# Patient Record
Sex: Female | Born: 1965 | Race: White | Hispanic: No | Marital: Single | Smoking: Never smoker
Health system: Southern US, Community
[De-identification: ages and names within clinical notes are randomized; demographics above are authoritative.]

## PROBLEM LIST (undated history)

## (undated) DIAGNOSIS — M1990 Unspecified osteoarthritis, unspecified site: Secondary | ICD-10-CM

## (undated) DIAGNOSIS — R519 Headache, unspecified: Secondary | ICD-10-CM

## (undated) DIAGNOSIS — C801 Malignant (primary) neoplasm, unspecified: Secondary | ICD-10-CM

## (undated) HISTORY — PX: BREAST SURGERY: SHX581

## (undated) HISTORY — DX: Unspecified osteoarthritis, unspecified site: M19.90

---

## 2001-12-08 ENCOUNTER — Other Ambulatory Visit: Admission: RE | Admit: 2001-12-08 | Discharge: 2001-12-08 | Payer: Self-pay | Admitting: Obstetrics and Gynecology

## 2002-01-13 ENCOUNTER — Encounter: Payer: Self-pay | Admitting: Obstetrics and Gynecology

## 2002-01-13 ENCOUNTER — Ambulatory Visit (HOSPITAL_COMMUNITY): Admission: RE | Admit: 2002-01-13 | Discharge: 2002-01-13 | Payer: Self-pay | Admitting: Obstetrics and Gynecology

## 2002-02-08 ENCOUNTER — Encounter: Payer: Self-pay | Admitting: Obstetrics and Gynecology

## 2002-02-08 ENCOUNTER — Ambulatory Visit (HOSPITAL_COMMUNITY): Admission: RE | Admit: 2002-02-08 | Discharge: 2002-02-08 | Payer: Self-pay | Admitting: Obstetrics and Gynecology

## 2002-03-25 ENCOUNTER — Ambulatory Visit (HOSPITAL_COMMUNITY): Admission: RE | Admit: 2002-03-25 | Discharge: 2002-03-25 | Payer: Self-pay | Admitting: Obstetrics and Gynecology

## 2002-06-03 ENCOUNTER — Inpatient Hospital Stay (HOSPITAL_COMMUNITY): Admission: AD | Admit: 2002-06-03 | Discharge: 2002-06-07 | Payer: Self-pay | Admitting: Obstetrics and Gynecology

## 2003-08-03 ENCOUNTER — Other Ambulatory Visit: Admission: RE | Admit: 2003-08-03 | Discharge: 2003-08-03 | Payer: Self-pay | Admitting: Obstetrics and Gynecology

## 2003-11-23 ENCOUNTER — Inpatient Hospital Stay (HOSPITAL_COMMUNITY): Admission: AD | Admit: 2003-11-23 | Discharge: 2003-11-23 | Payer: Self-pay | Admitting: Obstetrics and Gynecology

## 2004-02-02 ENCOUNTER — Inpatient Hospital Stay (HOSPITAL_COMMUNITY): Admission: RE | Admit: 2004-02-02 | Discharge: 2004-02-05 | Payer: Self-pay | Admitting: Obstetrics and Gynecology

## 2006-07-14 ENCOUNTER — Emergency Department (HOSPITAL_COMMUNITY): Admission: EM | Admit: 2006-07-14 | Discharge: 2006-07-14 | Payer: Self-pay | Admitting: Emergency Medicine

## 2010-04-30 ENCOUNTER — Ambulatory Visit
Admission: RE | Admit: 2010-04-30 | Discharge: 2010-04-30 | Payer: Self-pay | Source: Home / Self Care | Attending: Family Medicine | Admitting: Family Medicine

## 2010-04-30 ENCOUNTER — Encounter: Payer: Self-pay | Admitting: Family Medicine

## 2010-04-30 ENCOUNTER — Other Ambulatory Visit
Admission: RE | Admit: 2010-04-30 | Discharge: 2010-04-30 | Payer: Self-pay | Source: Home / Self Care | Admitting: Family Medicine

## 2010-05-03 ENCOUNTER — Encounter (INDEPENDENT_AMBULATORY_CARE_PROVIDER_SITE_OTHER): Payer: Self-pay | Admitting: *Deleted

## 2010-05-08 ENCOUNTER — Encounter
Admission: RE | Admit: 2010-05-08 | Discharge: 2010-05-08 | Payer: Self-pay | Source: Home / Self Care | Attending: Family Medicine | Admitting: Family Medicine

## 2010-05-08 ENCOUNTER — Ambulatory Visit
Admission: RE | Admit: 2010-05-08 | Discharge: 2010-05-08 | Payer: Self-pay | Source: Home / Self Care | Attending: Family Medicine | Admitting: Family Medicine

## 2010-05-08 ENCOUNTER — Other Ambulatory Visit: Payer: Self-pay | Admitting: Family Medicine

## 2010-05-08 ENCOUNTER — Encounter: Payer: Self-pay | Admitting: Family Medicine

## 2010-05-08 LAB — HEPATIC FUNCTION PANEL
ALT: 12 U/L (ref 0–35)
AST: 15 U/L (ref 0–37)
Albumin: 4 g/dL (ref 3.5–5.2)
Alkaline Phosphatase: 74 U/L (ref 39–117)
Bilirubin, Direct: 0.1 mg/dL (ref 0.0–0.3)
Total Bilirubin: 0.6 mg/dL (ref 0.3–1.2)
Total Protein: 7.4 g/dL (ref 6.0–8.3)

## 2010-05-08 LAB — BASIC METABOLIC PANEL
BUN: 12 mg/dL (ref 6–23)
CO2: 27 mEq/L (ref 19–32)
Calcium: 8.7 mg/dL (ref 8.4–10.5)
Chloride: 104 mEq/L (ref 96–112)
Creatinine, Ser: 0.8 mg/dL (ref 0.4–1.2)
GFR: 82.03 mL/min (ref >60.00)
Glucose, Bld: 88 mg/dL (ref 70–99)
Potassium: 4.7 mEq/L (ref 3.5–5.1)
Sodium: 139 mEq/L (ref 135–145)

## 2010-05-08 LAB — CBC WITH DIFFERENTIAL/PLATELET
Basophils Absolute: 0 10*3/uL (ref 0.0–0.1)
Basophils Relative: 0.8 % (ref 0.0–3.0)
Eosinophils Absolute: 0.1 10*3/uL (ref 0.0–0.7)
Eosinophils Relative: 1.3 % (ref 0.0–5.0)
HCT: 40 % (ref 36.0–46.0)
Hemoglobin: 13.8 g/dL (ref 12.0–15.0)
Lymphocytes Relative: 31.8 % (ref 12.0–46.0)
Lymphs Abs: 1.6 10*3/uL (ref 0.7–4.0)
MCHC: 34.5 g/dL (ref 30.0–36.0)
MCV: 97.2 fl (ref 78.0–100.0)
Monocytes Absolute: 0.4 10*3/uL (ref 0.1–1.0)
Monocytes Relative: 7.8 % (ref 3.0–12.0)
Neutro Abs: 3 10*3/uL (ref 1.4–7.7)
Neutrophils Relative %: 58.3 % (ref 43.0–77.0)
Platelets: 243 10*3/uL (ref 150.0–400.0)
RBC: 4.12 Mil/uL (ref 3.87–5.11)
RDW: 12.2 % (ref 11.5–14.6)
WBC: 5.1 10*3/uL (ref 4.5–10.5)

## 2010-05-08 LAB — LIPID PANEL
Cholesterol: 245 mg/dL — ABNORMAL HIGH (ref 0–200)
HDL: 63.6 mg/dL (ref >39.00)
Total CHOL/HDL Ratio: 4
Triglycerides: 133 mg/dL (ref 0.0–149.0)
VLDL: 26.6 mg/dL (ref 0.0–40.0)

## 2010-05-08 LAB — TSH: TSH: 6.43 u[IU]/mL — ABNORMAL HIGH (ref 0.35–5.50)

## 2010-05-08 LAB — LDL CHOLESTEROL, DIRECT: Direct LDL: 163.6 mg/dL

## 2010-05-09 LAB — CONVERTED CEMR LAB: Vit D, 25-Hydroxy: 36 ng/mL (ref 30–89)

## 2010-05-09 LAB — T3, FREE: T3, Free: 2.8 pg/mL (ref 2.3–4.2)

## 2010-05-09 LAB — T4, FREE: Free T4: 0.7 ng/dL (ref 0.60–1.60)

## 2010-05-10 ENCOUNTER — Telehealth (INDEPENDENT_AMBULATORY_CARE_PROVIDER_SITE_OTHER): Payer: Self-pay | Admitting: *Deleted

## 2010-05-15 ENCOUNTER — Encounter
Admission: RE | Admit: 2010-05-15 | Discharge: 2010-05-15 | Payer: Self-pay | Source: Home / Self Care | Attending: Family Medicine | Admitting: Family Medicine

## 2010-05-15 ENCOUNTER — Encounter: Payer: Self-pay | Admitting: Family Medicine

## 2010-05-24 NOTE — Progress Notes (Signed)
Summary: labs-  Phone Note Outgoing Call   Call placed by: Doristine Devoid CMA,  May 10, 2010 10:54 AM Call placed to: Patient Summary of Call: TSH is elevated.   total cholesterol and LDL are both elevated.  needs to start Simvastatin 40mg  at bedtime and recheck LFTs in 6-8 weeks. will decide on follow up once we have the thyroid results  levels are at low end of normal and given elevated TSH, body is working hard to maintain these levels.  should start synthroid and recheck labs in 3 months.  Follow-up for Phone Call        left message on machine ...Marland KitchenMarland KitchenDoristine Devoid CMA  May 10, 2010 10:59 AM     New/Updated Medications: SYNTHROID 100 MCG TABS (LEVOTHYROXINE SODIUM) take one tablet daily SIMVASTATIN 40 MG TABS (SIMVASTATIN) take one tablet at bedtime Prescriptions: SIMVASTATIN 40 MG TABS (SIMVASTATIN) take one tablet at bedtime  #30 x 3   Entered by:   Doristine Devoid CMA   Authorized by:   Neena Rhymes MD   Signed by:   Doristine Devoid CMA on 05/10/2010   Method used:   Electronically to        [id number] (retail)       [address]       [place], [place]  [address]       Ph: [phone] or [phone]       Fax: [phone]   RxID:   [id number] SYNTHROID 100 MCG TABS (LEVOTHYROXINE SODIUM) take one tablet daily  #30 x 3   Entered by:   Doristine Devoid CMA   Authorized by:   Neena Rhymes MD   Signed by:   Doristine Devoid CMA on 05/10/2010   Method used:   Electronically to        [id number] (retail)       [address]       [place], [place]  [address]       Ph: [phone] or [phone]       Fax: [phone]   RxID:   [id number]

## 2010-05-24 NOTE — Assessment & Plan Note (Signed)
Summary: new to est//requesting cpx//lch   Vital Signs:  Patient profile:   45 year old female Height:      59.50 inches Weight:      148 pounds BMI:     29.50 Pulse rate:   70 / minute BP sitting:   112 / 80  (left arm)  Vitals Entered By: Doristine Devoid CMA (April 30, 2010 3:27 PM) CC: NEW EST- CPX AND LABS    History of Present Illness: 45 yo woman here today to establish care.  previously seeing [facility] only as needed.  no pap or mammo in 6 yrs.  Preventive Screening-Counseling & Management  Alcohol-Tobacco     Alcohol drinks/day: <1     Smoking Status: never  Caffeine-Diet-Exercise     Does Patient Exercise: no      Sexual History:  currently monogamous.        Drug Use:  never.    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  The patient denies anorexia, fever, weight loss, weight gain, vision loss, decreased hearing, hoarseness, chest pain, syncope, dyspnea on exertion, peripheral edema, prolonged cough, abdominal pain, melena, hematochezia, severe indigestion/heartburn, hematuria, suspicious skin lesions, depression, abnormal bleeding, enlarged lymph nodes, and breast masses.         HAs- pt feels these are stress related  Physical Exam  General:  Well-developed,well-nourished,in no acute distress; alert,appropriate and cooperative throughout examination Head:  Normocephalic and atraumatic  without obvious abnormalities. No apparent alopecia or balding. Eyes:  No corneal or conjunctival inflammation noted. EOMI. Perrla. Funduscopic exam benign, without hemorrhages, exudates or papilledema. Vision grossly normal. Ears:  External ear exam shows no significant lesions or deformities.  Otoscopic examination reveals clear canals, tympanic membranes are intact bilaterally without bulging, retraction, inflammation or discharge. Hearing is grossly normal bilaterally. Nose:  External nasal examination shows no deformity or inflammation. Nasal mucosa are pink and moist without lesions or exudates. Mouth:  Oral mucosa and oropharynx without lesions or exudates.  Teeth in good repair. Neck:  No deformities, masses, or tenderness noted. Breasts:  No mass, nodules, thickening, tenderness, bulging, retraction, inflamation, nipple discharge or skin changes noted.   Lungs:  Normal respiratory effort, chest expands symmetrically. Lungs are clear to auscultation, no crackles or wheezes. Heart:  Normal rate and regular rhythm. S1 and S2 normal without gallop, murmur, click, rub or other extra sounds. Abdomen:  Bowel sounds positive,abdomen soft and non-tender without masses, organomegaly or hernias noted. Genitalia:  Pelvic Exam:        External: normal female genitalia without lesions or masses        Vagina: normal without lesions or masses        Cervix: normal without lesions or masses        Adnexa: normal bimanual exam without masses or fullness        Uterus: normal by palpation        Pap smear: performed Pulses:  +2 carotid, radial,  DP Extremities:  No clubbing, cyanosis, edema, or deformity noted with normal full range of motion of all joints.   Neurologic:  No cranial nerve deficits noted. Station and gait are normal. Plantar reflexes are down-going bilaterally. DTRs are symmetrical throughout. Sensory, motor and coordinative functions appear intact. Skin:  Intact without suspicious lesions  or rashes Cervical Nodes:  No lymphadenopathy noted Axillary Nodes:  No palpable lymphadenopathy Psych:  Cognition and judgment appear intact. Alert and cooperative with normal attention span and concentration. No apparent delusions, illusions, hallucinations   Impression & Recommendations:  Problem # 1:  HEALTH SCREENING (ICD-V70.0) Assessment New  pt's PE WNL.  pt to return for fasting labs.  will refer for mammogram.  anticipatory guidance provided.  Orders: EKG w/ Interpretation (93000) Radiology Referral (Radiology)  Problem # 2:  SCREENING FOR MALIGNANT NEOPLASM OF THE CERVIX (ICD-V76.2) Assessment: New pap collected.  Patient Instructions: 1)  Please schedule a fasting lab visit at your convenience- do not eat before this appt 2)  BMP prior to visit, ICD-9: V70 3)  Hepatic Panel prior to visit ICD-9: V70 4)  Lipid panel prior to visit ICD-9 : V70 5)  TSH prior to visit ICD-9 : V70 6)  CBC w/ Diff prior to visit ICD-9 : V70 7)  Vit D: V70 8)  Your exam looks great!  Keep up the good work 9)  We'll call you with your mammogram appt 10)  Try and get regular exercise 11)  Schedule an eye exam 12)  If your labs are normal but the fatigue doesn't improve- please call and let me know 13)  Call with any questions or concerns 14)  Welcome!  We're glad to have you!   Orders Added: 1)  EKG w/ Interpretation [93000] 2)  New Patient 40-64 years [40102] 3)  Radiology Referral [Radiology]

## 2010-05-24 NOTE — Letter (Signed)
Summary: Results Follow up Letter  [facility]  [address] [place], [place] [address]   Phone: [phone]  Fax: [phone]    05/03/2010 MRN: [id number]  [name] [address] [place], [place]  [address]  Dear [name],  The following are the results of your recent test(s):  Test         Result    Pap Smear:        Normal __X___  Not Normal _____

## 2010-09-07 NOTE — Discharge Summary (Signed)
[name], [name]                           ACCOUNT NO.:  [id number]   MEDICAL RECORD NO.:  [id number]                   PATIENT TYPE:  INP   LOCATION:  9139                                 [facility]   PHYSICIAN:  Zenaida Niece, M.D.             DATE OF BIRTH:  [birth year]   DATE OF ADMISSION:  06/03/2002  DATE OF DISCHARGE:  06/07/2002                                 DISCHARGE SUMMARY   ADMISSION DIAGNOSIS:  Intrauterine pregnancy at 4 weeks, advanced maternal  age.   DISCHARGE DIAGNOSIS:  Intrauterine pregnancy at 16 weeks, advanced maternal  age and arrest of dilation.   PROCEDURE:  June 04, 2002:  The patient had a primary low transverse  cesarean section.   HISTORY AND PHYSICAL:  This is a 45 year-old white female Gravida I, Para 0  with an EGA of 38 plus weeks by a 12 week ultrasound with a due date of  February 20 who presents with a complaint of regular contractions with some  vaginal spotting, good fetal movement and no ruptured membranes.  Earlier on  the day of admission vaginal exam was 1, 90 and -1 to -2 and she changed to  2 to 3, 90 and -1 to -2 and was admitted.  Prenatal care was complicated by  advanced maternal age for which she declined amniocentesis and fetal  screening and had a normal ultrasound.   PRENATAL LABORATORIES:  Blood type is O negative with a negative antibody  screen.  RPR non-reactive.  Rubella immune.  Hepatitis B surface antigen  negative.  HIV negative.  Gonorrhea and Chlamydia negative.  One hour  Glucola 111.  Group B Strep was negative.   GYN HISTORY:  Three years ago she had an abnormal Pap smear with normal  follow-up.   PAST MEDICAL HISTORY:  Migraine headaches.   PHYSICAL EXAMINATION:  VITAL SIGNS:  She is afebrile with stable vital  signs.  FETAL HEART TRACING:  Reactive with contractions every three to five  minutes.  ABDOMEN:  Gravid with an estimated fetal weight of 8.5 pounds.  VAGINAL EXAM:  On my first  exam she was 3 to 4, 90 and -1 to -2 with a  vertex presentation, adequate pelvis and amniotomy revealed clear fluid.   HOSPITAL COURSE:  The patient was admitted and continued to contract on her  own.  She received IV Stadol and then received an epidural.  On my first  exam, as mentioned above, amniotomy ws performed for augmentation.  She  continued to contract well every two to four minutes.  However, she  progressed to 4 to 5 cm and did not progress any further than that despite  adequate contractions.  Thus on the morning of February 13 she had a primary  low transverse cesarean section under epidural anesthesia for arrestive  dilation.  Estimated blood loss was 800 mL.  The patient had  normal anatomy  and delivered a viable female infant with Apgars of 8 and 9 weighing 7 pounds  and 2 ounces.  The patient did have a temperature to 100.6 just before  delivery and this was monitored.  She remained afebrile after delivery and  was rapidly ambulated and tolerated a regular diet.  On the morning of  postoperative day number three, the patient was doing well but the baby was  receiving double phototherapy for jaundice.  The patient's incision was  healing well.  Pre-delivery hemoglobin was 13.9 and post-delivery was 11.1.  Later that morning the pediatricians felt the baby was stable to go home and  the patient was stable to go home as well.   DISCHARGE INSTRUCTIONS:  1. Regular diet.  2. Pelvic rest.  3. No strenuous activity.   FOLLOW UP:  Follow-up is in approximately ten days for an incision check.  The nurse was to remove the staples and apply Steri-Strips.   DISCHARGE MEDICATIONS:  1. She is given a prescription for Percocet one to two p.o. q.4 to 6h p.r.n.     well as Motrin.   She is also given our discharge pamphlet.                                               Zenaida Niece, M.D.    TDM/MEDQ  D:  06/29/2002  T:  06/29/2002  Job:  [id number]

## 2010-09-07 NOTE — H&P (Signed)
NAMECARYLE, [name]                 ACCOUNT NO.:  [id number]   MEDICAL RECORD NO.:  [id number]          PATIENT TYPE:  MAT   LOCATION:  [facility]C                          [facility]   PHYSICIAN:  Zenaida Niece, M.D.DATE OF BIRTH:  1965-12-26   DATE OF ADMISSION:  02/02/2004  DATE OF DISCHARGE:                                HISTORY & PHYSICAL   CHIEF COMPLAINT:  Repeat cesarean section.   HISTORY OF PRESENT ILLNESS:  This is a 45 year old white female gravida 2,  para 1-0-0-1 with an EGA of 38+ weeks by a 10 week ultrasound with a due  date of February 10, 2004 who presents for repeat cesarean section.  She had  one low transverse cesarean section for arrest of dilation in 2004, is  cleared for a trial of labor but declines this and wants repeat cesarean  section.  Prenatal care has been complicated by advanced maternal age with  normal first trimester screening and has been essentially uncomplicated  otherwise.   PRENATAL LABORATORIES:  Blood type is O- with a negative antibody screen.  RPR nonreactive.  Rubella immune.  Hepatitis B surface antigen negative.  HIV negative.  Gonorrhea and Chlamydia negative.  One hour Glucola 112 and  group B strep is negative.   PAST OBSTETRIC HISTORY:  In 2004, low transverse cesarean section at 38  weeks, 7 pounds 3 ounces for arrest of dilation.  That baby has aortic  stenosis.   GYNECOLOGIC HISTORY:  History of abnormal Pap smear four years ago with  normal repeats.   PAST MEDICAL HISTORY:  Negative.   PAST SURGICAL HISTORY:  Cesarean section.   ALLERGIES:  None known.   CURRENT MEDICATIONS:  None.   SOCIAL HISTORY:  The patient is married and denies alcohol, tobacco or drug  use.   FAMILY HISTORY:  Noncontributory.   REVIEW OF SYSTEMS:  Otherwise negative.   PHYSICAL EXAMINATION:  VITAL SIGNS:  Weight is 161 pounds, blood pressure  110/82.  GENERAL:  This is a well-developed, well-nourished, gravid female in no  acute  distress.  NECK:  Supple without lymphadenopathy or thyromegaly.  LUNGS:  Clear to auscultation.  HEART:  A regular rate and rhythm without murmur.  ABDOMEN:  Gravid with a fundal height of 39 cm and nontender with a well-  healed transverse scar.  EXTREMITIES:  Trace edema, are nontender.  PELVIC:  Cervix is 1, 70, -3 and vertex presentation.   ASSESSMENT:  1.  Intra-uterine pregnancy at 38+ weeks.  2.  Previous cesarean section.  The patient is cleared for a trial of labor      but declines this and wants repeat cesarean section.  The risks of      surgery including bleeding, infection and damage to surrounding organs      have been discussed and she understands.  3.  Advanced maternal age with a normal first trimester screening and normal      ultrasound.   PLAN:  To admit the patient for repeat cesarean section.     Todd   TDM/MEDQ  D:  02/01/2004  T:  02/01/2004  Job:  [id number]

## 2010-09-07 NOTE — Op Note (Signed)
[name], [name]                 ACCOUNT NO.:  [id number]   MEDICAL RECORD NO.:  [id number]          PATIENT TYPE:  INP   LOCATION:  9199                          [facility]   PHYSICIAN:  Zenaida Niece, M.D.DATE OF BIRTH:  1965-12-28   DATE OF PROCEDURE:  02/02/2004  DATE OF DISCHARGE:                                 OPERATIVE REPORT   PREOPERATIVE DIAGNOSES:  1.  Intrauterine pregnancy at 38+ weeks.  2.  Previous cesarean section.  3.  Advanced maternal age.   POSTOPERATIVE DIAGNOSES:  1.  Intrauterine pregnancy at 38+ weeks.  2.  Previous cesarean section.  3.  Advanced maternal age.   PROCEDURE:  Repeat low transverse cesarean section.   SURGEON:  Zenaida Niece, M.D.   ASSISTANT:  Malachi Pro. Ambrose Mantle, M.D.   ANESTHESIA:  Spinal.   ESTIMATED BLOOD LOSS:  1000 cc.   FINDINGS:  The patient had normal anatomy and delivered a viable female infant  with Apgars of 9 and 9 that weighed 8 pounds 9 ounces and was delivered with  assistance from the vacuum.   DESCRIPTION OF PROCEDURE:  The patient was taken to the operating room and  placed in the sitting position.  Dr. Cristela Blue instilled spinal  anesthesia, and she was placed in the dorsal supine position with a left  lateral tilt.  The abdomen was prepped and draped in the usual sterile  fashion and a Foley catheter inserted.   The level of her anesthesia was found to be adequate, and her abdomen was  entered via her previous Pfannenstiel scar.  The bladder blade was placed  and the bladder pushed inferior.  A 4-cm transverse incision was made in the  lower uterine segment.  Clear amniotic fluid was noted.  The uterine  incision was extended bilaterally digitally.  The fetal vertex was grasped  and brought to the incision.  However, it was difficult to deliver through  the incision.  With fundal pressure and assistance from a vacuum, the head  was delivered with some effort.  The mouth and nares were suctioned.   The  shoulders were then also delivered with some difficulty, but the baby was  eventually delivered.  The cord was doubly clamped and cut and the infant  handed to the awaiting pediatric team.  Cord blood and segment of cord for  gas were obtained.  The placenta delivered spontaneously.  The uterus was  wiped dry with a clean lap pad and all clots and debris removed.  The  uterine incision was inspected and found to be free of extensions.  The  uterine incision was then closed in one layer being a running locking layer  with #1 chromic.  Bleeding from serosal edges was controlled with  electrocautery.  Bleeding from the right angle was controlled with a figure-  of-eight suture of #1 chromic.  Both tubes and ovaries were inspected and  found to be normal, and the pericolic gutters were blotted of all clots and  debris.  The uterine incision was again inspected and found to be  hemostatic.  The subfascial space was then irrigated and made hemostatic  with electrocautery.  The fascia was closed in a running fashion starting at  both ends and meeting in the middle with 0 Vicryl.  The subcutaneous tissue  was irrigated and made hemostatic with electrocautery.  The skin was then  closed with staples and a sterile dressing.   The patient tolerated the procedure well and was taken to the recovery room  in stable condition.  Counts were correct x2.  She was given Ancef 1 g after  cord clamp.     Todd   TDM/MEDQ  D:  02/02/2004  T:  02/02/2004  Job:  [id number]

## 2010-09-07 NOTE — Discharge Summary (Signed)
[name], [name]                 ACCOUNT NO.:  [id number]   MEDICAL RECORD NO.:  [id number]          PATIENT TYPE:  INP   LOCATION:  9108                          [facility]   PHYSICIAN:  Zenaida Niece, M.D.DATE OF BIRTH:  11-10-1965   DATE OF ADMISSION:  02/02/2004  DATE OF DISCHARGE:  02/05/2004                                 DISCHARGE SUMMARY   ADMISSION DIAGNOSES:  1.  Intrauterine pregnancy at 38+ weeks.  2.  Previous cesarean section.  3.  Advanced maternal age.   DISCHARGE DIAGNOSES:  1.  Intrauterine pregnancy at 38+ weeks.  2.  Previous cesarean section.  3.  Advanced maternal age.   PROCEDURES:  On February 02, 2004 she had a repeat low transverse cesarean  section.   HISTORY AND PHYSICAL:  Please see chart for full history and physical, but  briefly this is a 45 year old white female gravida 2 para 1-0-0-1 with an  EGA of 38+ weeks who presents for repeat cesarean section.  She has had one  low transverse cesarean section, is cleared for a trial of labor but  declines this.  Prenatal care has been essentially uncomplicated.  Past  history significant for the cesarean section.  Physical exam significant for  weight of 161 and a benign abdomen with a fundal height of 39 cm and a well-  healed transverse scar.  Cervix is 1, 70, -3, and vertex.   HOSPITAL COURSE:  The patient was admitted on the day of surgery and  underwent a repeat low transverse cesarean section under spinal anesthesia  with an estimated blood loss of 1000 mL.  She delivered a viable female infant  with Apgars of 9 and 9 that weighed 8 pounds 9 ounces and required vacuum  extraction.  Postoperatively, she had no significant complications.  Predelivery hemoglobin 13.4, postdelivery 12.6.  On postoperative day #3 she  was felt to be stable enough for discharge home.  Prior to discharge home,  her staples were removed and Steri-Strips applied.   DISCHARGE INSTRUCTIONS:  Regular diet, pelvic  rest, no strenuous activity.  Follow-up is in 10-14 days to check her incision.  Medications are Percocet  p.r.n. and over-the-counter ibuprofen p.r.n., and she is given our discharge  pamphlet.     Todd   TDM/MEDQ  D:  02/18/2004  T:  02/18/2004  Job:  [id number]

## 2010-09-07 NOTE — Op Note (Signed)
NAME:  [name], [name]                           ACCOUNT NO.:  [id number]   MEDICAL RECORD NO.:  [id number]                   PATIENT TYPE:  INP   LOCATION:  9177                                 [facility]   PHYSICIAN:  Zenaida Niece, M.D.             DATE OF BIRTH:  15-Sep-1965   DATE OF PROCEDURE:  06/04/2002  DATE OF DISCHARGE:                                 OPERATIVE REPORT   PREOPERATIVE DIAGNOSES:  1. Intrauterine pregnancy at 38+ weeks.  2. Arrest of dilation.   POSTOPERATIVE DIAGNOSES:  1. Intrauterine pregnancy at 38+ weeks.  2. Arrest of dilation.   PROCEDURE:  Primary low transverse cesarean section without extension.   SURGEON:  Zenaida Niece, M.D.   ASSISTANTManson Allan, medical student.   ANESTHESIA:  Epidural.   ESTIMATED BLOOD LOSS:  800 mL.   FINDINGS:  Normal female anatomy, and the patient delivered a viable female  infant with Apgars of 8 and 9 and a weight pending at the time of dictation.  The patient did have a temperature to 100.6 just prior to delivery.   DESCRIPTION OF PROCEDURE:  The patient was taken to the operating room and  placed in the dorsal supine position with a left lateral tilt.  Her  previously-placed epidural was dosed and her abdomen was prepped and draped  in the usual sterile fashion.  The level of her anesthesia was found to be  adequate and her abdomen was entered via standard Pfannenstiel incision.  The vesicouterine peritoneum was incised and a bladder flap created  digitally.  A 4 cm transverse incision was made in the lower uterine  segment.  The incision was extended bilaterally digitally.  The fetal vertex  was grasped and easily delivered through the incision atraumatically.  Mouth  and nares were suctioned.  The remainder of the infant then delivered  atraumatically and the cord was doubly clamped and cut and the infant handed  to the awaiting pediatric team.  Cord blood and a segment of cord for gas  were  obtained and the placenta delivered spontaneously.  The uterus was  wiped dry with a clean lap pad.  All clots and debris were removed.  The  uterine incision was inspected and found to be free of extensions.  The  incision was closed in one layer, being a running locking layer with #1  chromic.  Bleeding from the right angle was controlled with an imbricating  suture also of #1 chromic.  This achieved adequate hemostasis.  Both tubes  and ovaries were inspected and found to be normal.  The uterine incision was  again inspected and found to be hemostatic.  The subfascial space was  irrigated and made hemostatic with electrocautery.  The fascia was closed in  a running fashion starting at both ends and meeting in the  middle with 0 Vicryl.  The subcutaneous tissue was  irrigated and made  hemostatic with electrocautery.  The skin was then closed with staples and a  sterile dressing.  The patient tolerated the procedure well and was taken to  the recovery room in stable condition.  Counts were correct x2, and she was  given Ancef 1 g after cord clamp.                                               Zenaida Niece, M.D.    TDM/MEDQ  D:  06/04/2002  T:  06/04/2002  Job:  [id number]

## 2010-09-26 ENCOUNTER — Other Ambulatory Visit: Payer: Self-pay | Admitting: Family Medicine

## 2010-09-27 NOTE — Telephone Encounter (Signed)
Refill sent, labs due now.

## 2010-11-01 ENCOUNTER — Other Ambulatory Visit: Payer: Self-pay | Admitting: Family Medicine

## 2010-11-01 NOTE — Telephone Encounter (Signed)
Per Beverely Low noted that no more refills until labs are completed and sent refill.

## 2010-12-08 ENCOUNTER — Other Ambulatory Visit: Payer: Self-pay | Admitting: Family Medicine

## 2011-01-22 ENCOUNTER — Other Ambulatory Visit: Payer: Self-pay | Admitting: Family Medicine

## 2011-02-16 ENCOUNTER — Other Ambulatory Visit: Payer: Self-pay | Admitting: Family Medicine

## 2011-02-18 ENCOUNTER — Other Ambulatory Visit: Payer: Self-pay | Admitting: *Deleted

## 2011-02-18 MED ORDER — SIMVASTATIN 40 MG PO TABS: 40.0000 mg | ORAL_TABLET | Freq: Every day | ORAL | Status: DC

## 2011-02-18 NOTE — Telephone Encounter (Signed)
rx sent to pharmacy by e-script Advised pt via script to call office for labs and cpx asap

## 2011-06-10 ENCOUNTER — Other Ambulatory Visit: Payer: Self-pay | Admitting: Family Medicine

## 2011-06-10 DIAGNOSIS — Z1231 Encounter for screening mammogram for malignant neoplasm of breast: Secondary | ICD-10-CM

## 2011-06-21 ENCOUNTER — Ambulatory Visit
Admission: RE | Admit: 2011-06-21 | Discharge: 2011-06-21 | Disposition: A | Discharge status: Home / Self Care | Payer: BC Managed Care – PPO | Source: Ambulatory Visit | Attending: Family Medicine | Admitting: Family Medicine

## 2011-06-21 ENCOUNTER — Other Ambulatory Visit: Payer: Self-pay | Admitting: Family Medicine

## 2011-06-21 DIAGNOSIS — Z1231 Encounter for screening mammogram for malignant neoplasm of breast: Secondary | ICD-10-CM

## 2011-07-24 ENCOUNTER — Ambulatory Visit (INDEPENDENT_AMBULATORY_CARE_PROVIDER_SITE_OTHER): Payer: BC Managed Care – PPO | Admitting: Emergency Medicine

## 2011-07-24 VITALS — BP 132/84 | HR 84 | Temp 99.6°F | Resp 16 | Ht 60.25 in | Wt 142.6 lb

## 2011-07-24 DIAGNOSIS — J019 Acute sinusitis, unspecified: Secondary | ICD-10-CM

## 2011-07-24 DIAGNOSIS — J329 Chronic sinusitis, unspecified: Secondary | ICD-10-CM

## 2011-07-24 DIAGNOSIS — J069 Acute upper respiratory infection, unspecified: Secondary | ICD-10-CM

## 2011-07-24 MED ORDER — AMOXICILLIN 875 MG PO TABS: 875.0000 mg | ORAL_TABLET | Freq: Two times a day (BID) | ORAL | Status: AC

## 2011-07-24 MED ORDER — FLUTICASONE PROPIONATE 50 MCG/ACT NA SUSP: 2.0000 | Freq: Every day | NASAL | Status: DC

## 2011-07-24 NOTE — Progress Notes (Signed)
  Subjective:    Patient ID: [name], female    DOB: August 07, 1965, 46 y.o.   MRN: [id number]  HPI patient presents with onset Saturday of upper respiratory type congestion. She is now develop pain in her right ear as well as a significant postnasal drip. She has a dry nonproductive cough. Both of her sons have been sick.    Review of Systems she denies significant fever chest pain or GI symptoms.     Objective:   Physical Exam  Constitutional: She appears well-developed.  HENT:  Head: Normocephalic.       Examination the right ear reveals dullness with fluid behind the TM .  Eyes: EOM are normal. Pupils are equal, round, and reactive to light.  Cardiovascular: Normal rate and regular rhythm.   Pulmonary/Chest: No respiratory distress. She has no wheezes. She has no rales. She exhibits no tenderness.          Assessment & Plan:   Assessment is upper respiratory infection with sinusitis. We'll treat with antibiotics nasal spray and see if we can get her clear

## 2011-07-24 NOTE — Patient Instructions (Signed)

## 2012-06-29 ENCOUNTER — Ambulatory Visit (INDEPENDENT_AMBULATORY_CARE_PROVIDER_SITE_OTHER): Payer: BC Managed Care – PPO | Admitting: Physician Assistant

## 2012-06-29 VITALS — BP 136/86 | HR 109 | Temp 98.9°F | Resp 17 | Ht 60.5 in | Wt 144.0 lb

## 2012-06-29 DIAGNOSIS — J029 Acute pharyngitis, unspecified: Secondary | ICD-10-CM

## 2012-06-29 MED ORDER — AMOXICILLIN 875 MG PO TABS: 875.0000 mg | ORAL_TABLET | Freq: Two times a day (BID) | ORAL | Status: DC

## 2012-06-29 NOTE — Progress Notes (Signed)
   [address] [place] [address]   Phone [phone]  Subjective:    Patient ID: [name], female    DOB: 1965/06/30, 47 y.o.   MRN: [id number]  HPI Pt presents to clinic with 2 day h/o cold symptoms.  She started on Sat night with a sore throat and it has not improved.  She also has myalgias and fever, up to 101 last pm. She has no other cold symptoms.  She has been using OTC NSAIDs.  Her son had strep last week.   Review of Systems  Constitutional: Positive for fever and chills.  HENT: Positive for sore throat. Negative for ear pain, congestion, rhinorrhea and postnasal drip.   Respiratory: Negative for cough.   Gastrointestinal: Negative for nausea, vomiting and diarrhea.  Musculoskeletal: Positive for myalgias.  Neurological: Positive for headaches.       Objective:   Physical Exam  Vitals reviewed. Constitutional: She is oriented to person, place, and time. She appears well-developed and well-nourished.  HENT:  Head: Normocephalic and atraumatic.  Right Ear: Hearing, tympanic membrane, external ear and ear canal normal.  Left Ear: Hearing, tympanic membrane, external ear and ear canal normal.  Nose: Nose normal.  Mouth/Throat: Uvula is midline. Posterior oropharyngeal erythema present. No oropharyngeal exudate, posterior oropharyngeal edema or tonsillar abscesses.  Cardiovascular: Normal rate, regular rhythm and normal heart sounds.   No murmur heard. Pulmonary/Chest: Effort normal and breath sounds normal.  Neurological: She is alert and oriented to person, place, and time.  Skin: Skin is warm and dry.  Psychiatric: She has a normal mood and affect. Her behavior is normal. Judgment and thought content normal.        Assessment & Plan:  Sore throat -with her neg rapid strep but with her sons positive culture last week we will start treatment while waiting for her throat culture to return.  She will continue with her NSAIDs to help with fever and pain. Plan:  POCT rapid strep A, Culture, Group A Strep, amoxicillin (AMOXIL) 875 MG tablet

## 2015-08-30 ENCOUNTER — Ambulatory Visit (INDEPENDENT_AMBULATORY_CARE_PROVIDER_SITE_OTHER): Payer: BC Managed Care – PPO | Admitting: Urgent Care

## 2015-08-30 ENCOUNTER — Ambulatory Visit (INDEPENDENT_AMBULATORY_CARE_PROVIDER_SITE_OTHER): Payer: BC Managed Care – PPO

## 2015-08-30 VITALS — BP 122/80 | HR 86 | Temp 98.3°F | Resp 16 | Ht 60.5 in | Wt 149.0 lb

## 2015-08-30 DIAGNOSIS — R109 Unspecified abdominal pain: Secondary | ICD-10-CM | POA: Diagnosis not present

## 2015-08-30 LAB — COMPREHENSIVE METABOLIC PANEL
ALBUMIN: 4.5 g/dL (ref 3.6–5.1)
ALK PHOS: 88 U/L (ref 33–130)
ALT: 14 U/L (ref 6–29)
AST: 15 U/L (ref 10–35)
BILIRUBIN TOTAL: 0.4 mg/dL (ref 0.2–1.2)
BUN: 14 mg/dL (ref 7–25)
CALCIUM: 9.2 mg/dL (ref 8.6–10.4)
CO2: 23 mmol/L (ref 20–31)
Chloride: 104 mmol/L (ref 98–110)
Creat: 0.78 mg/dL (ref 0.50–1.05)
GLUCOSE: 102 mg/dL — AB (ref 65–99)
Potassium: 4.5 mmol/L (ref 3.5–5.3)
Sodium: 140 mmol/L (ref 135–146)
TOTAL PROTEIN: 7.6 g/dL (ref 6.1–8.1)

## 2015-08-30 LAB — CBC
HEMATOCRIT: 45.4 % — AB (ref 35.0–45.0)
Hemoglobin: 15.5 g/dL (ref 11.7–15.5)
MCH: 31.8 pg (ref 27.0–33.0)
MCHC: 34.1 g/dL (ref 32.0–36.0)
MCV: 93.2 fL (ref 80.0–100.0)
MPV: 10 fL (ref 7.5–12.5)
Platelets: 258 10*3/uL (ref 140–400)
RBC: 4.87 MIL/uL (ref 3.80–5.10)
RDW: 12.9 % (ref 11.0–15.0)
WBC: 6 10*3/uL (ref 3.8–10.8)

## 2015-08-30 LAB — POC MICROSCOPIC URINALYSIS (UMFC): MUCUS RE: ABSENT

## 2015-08-30 LAB — SEDIMENTATION RATE: Sed Rate: 4 mm/hr (ref 0–30)

## 2015-08-30 LAB — POCT URINALYSIS DIP (MANUAL ENTRY)
BILIRUBIN UA: NEGATIVE
Bilirubin, UA: NEGATIVE
Glucose, UA: NEGATIVE
Leukocytes, UA: NEGATIVE
Nitrite, UA: NEGATIVE
PH UA: 7
PROTEIN UA: NEGATIVE
SPEC GRAV UA: 1.015
UROBILINOGEN UA: 0.2

## 2015-08-30 LAB — TSH: TSH: 1.86 m[IU]/L

## 2015-08-30 MED ORDER — CYCLOBENZAPRINE HCL 5 MG PO TABS: 5.0000 mg | ORAL_TABLET | Freq: Three times a day (TID) | ORAL | Status: DC | PRN

## 2015-08-30 MED ORDER — NAPROXEN SODIUM 550 MG PO TABS: 550.0000 mg | ORAL_TABLET | Freq: Two times a day (BID) | ORAL | Status: DC

## 2015-08-30 NOTE — Patient Instructions (Addendum)
Flank Pain Flank pain refers to pain that is located on the side of the body between the upper abdomen and the back. The pain may occur over a short period of time (acute) or may be long-term or reoccurring (chronic). It may be mild or severe. Flank pain can be caused by many things. CAUSES  Some of the more common causes of flank pain include:  Muscle strains.   Muscle spasms.   A disease of your spine (vertebral disk disease).   A lung infection (pneumonia).   Fluid around your lungs (pulmonary edema).   A kidney infection.   Kidney stones.   A very painful skin rash caused by the chickenpox virus (shingles).   Gallbladder disease.  [place] care will depend on the cause of your pain. In general,  Rest as directed by your caregiver.  Drink enough fluids to keep your urine clear or pale yellow.  Only take over-the-counter or prescription medicines as directed by your caregiver. Some medicines may help relieve the pain.  Tell your caregiver about any changes in your pain.  Follow up with your caregiver as directed. SEEK IMMEDIATE MEDICAL CARE IF:   Your pain is not controlled with medicine.   You have new or worsening symptoms.  Your pain increases.   You have abdominal pain.   You have shortness of breath.   You have persistent nausea or vomiting.   You have swelling in your abdomen.   You feel faint or pass out.   You have blood in your urine.  You have a fever or persistent symptoms for more than 2-3 days.  You have a fever and your symptoms suddenly get worse. MAKE SURE YOU:   Understand these instructions.  Will watch your condition.  Will get help right away if you are not doing well or get worse.   This information is not intended to replace advice given to you by your health care provider. Make sure you discuss any questions you have with your health care provider.   Document Released: 05/30/2005 Document  Revised: 01/01/2012 Document Reviewed: 11/21/2011 Elsevier Interactive Patient Education 2016 [employer].     IF you received an x-ray today, you will receive an invoice from [facility]. Please contact [facility] at [phone] with questions or concerns regarding your invoice.   IF you received labwork today, you will receive an invoice from [employer]. Please contact [facility] at [phone] with questions or concerns regarding your invoice.   Our billing staff will not be able to assist you with questions regarding bills from these companies.  You will be contacted with the lab results as soon as they are available. The fastest way to get your results is to activate your My Chart account. Instructions are located on the last page of this paperwork. If you have not heard from [place] regarding the results in 2 weeks, please contact this office.

## 2015-08-30 NOTE — Progress Notes (Signed)
MRN: [id number] DOB: December 22, 1965  Subjective:   [name] is a 50 y.o. female presenting for chief complaint of Flank Pain  Reports 4 day history of right flank pain. Also admits a longstanding history of urinary frequency. Has tried Gas X without any relief. Of note, patient has had this problem before and has previously resolved on its own after 1-2 days. Denies history of frequent UTIs, denies history of kidney stones. Denies fever, n/v, abdominal pain, pelvic pain, dysuria, hematuria. Denies history of trauma. Does not do a lot of heavy lifting. Denies smoking cigarettes. Drinks 3 alcohol beverages per week.  [name] currently has no medications in their medication list. Also has No Known Allergies.  [name]  has a past medical history of Arthritis. Also  has past surgical history that includes Cesarean section.  Her family history includes COPD in her mother; Cancer in her father; Emphysema in her mother.   Objective:   Vitals: BP 122/80 mmHg  Pulse 86  Temp(Src) 98.3 F (36.8 C)  Resp 16  Ht 5' 0.5" (1.537 m)  Wt 149 lb (67.586 kg)  BMI 28.61 kg/m2  SpO2 99%  LMP 05/01/2012  Physical Exam  Constitutional: She is oriented to person, place, and time. She appears well-developed and well-nourished.  HENT:  Mouth/Throat: Oropharynx is clear and moist.  Eyes: Right eye exhibits no discharge. Left eye exhibits no discharge. No scleral icterus.  Neck: Normal range of motion. Neck supple.  Cardiovascular: Normal rate, regular rhythm and intact distal pulses.  Exam reveals no gallop and no friction rub.   No murmur heard. Pulmonary/Chest: No respiratory distress. She has no wheezes. She has no rales.      Tenderness over outlined areas.  Abdominal: Soft. Bowel sounds are normal. She exhibits no distension and no mass. There is no tenderness.  No hepatosplenomegaly. No CVA tenderness.  Lymphadenopathy:    She has no cervical adenopathy.  Neurological: She is alert and  oriented to person, place, and time.  Skin: Skin is warm and dry. No rash noted.   Dg Ribs Unilateral W/chest Right  08/30/2015  CLINICAL DATA:  Right flank pain EXAM: RIGHT RIBS AND CHEST - 3+ VIEW COMPARISON:  None. FINDINGS: Normal heart size. Lungs clear. No pneumothorax. Possible tiny right pleural effusion. No evidence of rib fracture. IMPRESSION: Possible tiny right pleural effusion. No evidence of rib fracture. Electronically Signed   By: Marybelle Killings M.D.   On: 08/30/2015 11:11   Results for orders placed or performed in visit on 08/30/15 (from the past 24 hour(s))  POCT urinalysis dipstick     Status: Abnormal   Collection Time: 08/30/15 10:33 AM  Result Value Ref Range   Color, UA yellow yellow   Clarity, UA clear clear   Glucose, UA negative negative   Bilirubin, UA negative negative   Ketones, POC UA negative negative   Spec Grav, UA 1.015    Blood, UA trace-lysed (A) negative   pH, UA 7.0    Protein Ur, POC negative negative   Urobilinogen, UA 0.2    Nitrite, UA Negative Negative   Leukocytes, UA Negative Negative  POCT Microscopic Urinalysis ([facility])     Status: Abnormal   Collection Time: 08/30/15 10:33 AM  Result Value Ref Range   WBC,UR,HPF,POC None None WBC/hpf   RBC,UR,HPF,POC Few (A) None RBC/hpf   Bacteria Few (A) None, Too numerous to count   Mucus Absent Absent   Epithelial Cells, UR Per Microscopy Few (A) None,  Too numerous to count cells/hpf   Assessment and Plan :   1. Flank pain - Labs pending, unclear etiology. Offered patient NSAID with muscle relaxant. Patient is to rtc in 1-2 weeks if she has no improvement.  Sherri Eagles, PA-C [facility] [phone] 08/30/2015 10:23 AM

## 2015-08-31 LAB — URINE CULTURE
Colony Count: NO GROWTH
Organism ID, Bacteria: NO GROWTH

## 2018-03-14 ENCOUNTER — Emergency Department (HOSPITAL_BASED_OUTPATIENT_CLINIC_OR_DEPARTMENT_OTHER)
Admission: EM | Admit: 2018-03-14 | Discharge: 2018-03-14 | Disposition: A | Discharge status: Home / Self Care | Payer: BC Managed Care – PPO | Attending: Emergency Medicine | Admitting: Emergency Medicine

## 2018-03-14 ENCOUNTER — Emergency Department (HOSPITAL_BASED_OUTPATIENT_CLINIC_OR_DEPARTMENT_OTHER): Payer: BC Managed Care – PPO

## 2018-03-14 ENCOUNTER — Encounter (HOSPITAL_BASED_OUTPATIENT_CLINIC_OR_DEPARTMENT_OTHER): Payer: Self-pay | Admitting: Emergency Medicine

## 2018-03-14 ENCOUNTER — Other Ambulatory Visit: Payer: Self-pay

## 2018-03-14 DIAGNOSIS — Y998 Other external cause status: Secondary | ICD-10-CM | POA: Diagnosis not present

## 2018-03-14 DIAGNOSIS — Y9389 Activity, other specified: Secondary | ICD-10-CM | POA: Diagnosis not present

## 2018-03-14 DIAGNOSIS — M542 Cervicalgia: Secondary | ICD-10-CM

## 2018-03-14 DIAGNOSIS — S1091XA Abrasion of unspecified part of neck, initial encounter: Secondary | ICD-10-CM | POA: Insufficient documentation

## 2018-03-14 DIAGNOSIS — Y92410 Unspecified street and highway as the place of occurrence of the external cause: Secondary | ICD-10-CM | POA: Diagnosis not present

## 2018-03-14 DIAGNOSIS — T148XXA Other injury of unspecified body region, initial encounter: Secondary | ICD-10-CM

## 2018-03-14 DIAGNOSIS — S20319A Abrasion of unspecified front wall of thorax, initial encounter: Secondary | ICD-10-CM | POA: Insufficient documentation

## 2018-03-14 DIAGNOSIS — S4992XA Unspecified injury of left shoulder and upper arm, initial encounter: Secondary | ICD-10-CM | POA: Diagnosis present

## 2018-03-14 MED ORDER — IBUPROFEN 800 MG PO TABS: 800.0000 mg | ORAL_TABLET | Freq: Three times a day (TID) | ORAL | 0 refills | Status: DC

## 2018-03-14 MED ORDER — METHOCARBAMOL 500 MG PO TABS: 500.0000 mg | ORAL_TABLET | Freq: Two times a day (BID) | ORAL | 0 refills | Status: DC | PRN
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
## 2018-04-02 ENCOUNTER — Emergency Department (HOSPITAL_BASED_OUTPATIENT_CLINIC_OR_DEPARTMENT_OTHER): Payer: BC Managed Care – PPO

## 2018-04-02 ENCOUNTER — Encounter (HOSPITAL_BASED_OUTPATIENT_CLINIC_OR_DEPARTMENT_OTHER): Payer: Self-pay

## 2018-04-02 ENCOUNTER — Other Ambulatory Visit: Payer: Self-pay

## 2018-04-02 ENCOUNTER — Emergency Department (HOSPITAL_BASED_OUTPATIENT_CLINIC_OR_DEPARTMENT_OTHER)
Admission: EM | Admit: 2018-04-02 | Discharge: 2018-04-02 | Disposition: A | Discharge status: Home / Self Care | Payer: BC Managed Care – PPO | Attending: Emergency Medicine | Admitting: Emergency Medicine

## 2018-04-02 DIAGNOSIS — R0789 Other chest pain: Secondary | ICD-10-CM | POA: Diagnosis not present

## 2018-04-02 DIAGNOSIS — Z79899 Other long term (current) drug therapy: Secondary | ICD-10-CM | POA: Insufficient documentation

## 2018-04-02 DIAGNOSIS — R079 Chest pain, unspecified: Secondary | ICD-10-CM | POA: Diagnosis present
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
## 2018-04-02 NOTE — Discharge Instructions (Signed)
It was my pleasure taking care of you today!   Ice and/or heat to affected area as needed for pain.  Ibuprofen as needed for additional pain relief.   Return to ER for new or worsening symptoms, any additional concerns.

## 2018-11-10 ENCOUNTER — Emergency Department (HOSPITAL_COMMUNITY): Payer: BC Managed Care – PPO

## 2018-11-10 ENCOUNTER — Inpatient Hospital Stay (HOSPITAL_COMMUNITY)
Admission: EM | Admit: 2018-11-10 | Discharge: 2018-11-14 | DRG: 115 | Disposition: A | Discharge status: Home / Self Care | Payer: BC Managed Care – PPO | Attending: Internal Medicine | Admitting: Internal Medicine

## 2018-11-10 ENCOUNTER — Encounter (HOSPITAL_COMMUNITY): Payer: Self-pay | Admitting: Emergency Medicine

## 2018-11-10 ENCOUNTER — Other Ambulatory Visit: Payer: Self-pay

## 2018-11-10 DIAGNOSIS — M316 Other giant cell arteritis: Secondary | ICD-10-CM | POA: Diagnosis present

## 2018-11-10 DIAGNOSIS — M2550 Pain in unspecified joint: Secondary | ICD-10-CM

## 2018-11-10 DIAGNOSIS — Z809 Family history of malignant neoplasm, unspecified: Secondary | ICD-10-CM

## 2018-11-10 DIAGNOSIS — Z791 Long term (current) use of non-steroidal anti-inflammatories (NSAID): Secondary | ICD-10-CM

## 2018-11-10 DIAGNOSIS — H4710 Unspecified papilledema: Secondary | ICD-10-CM

## 2018-11-10 DIAGNOSIS — H53411 Scotoma involving central area, right eye: Secondary | ICD-10-CM | POA: Diagnosis present

## 2018-11-10 DIAGNOSIS — M1990 Unspecified osteoarthritis, unspecified site: Secondary | ICD-10-CM | POA: Diagnosis present

## 2018-11-10 DIAGNOSIS — H3093 Unspecified chorioretinal inflammation, bilateral: Principal | ICD-10-CM | POA: Diagnosis present

## 2018-11-10 DIAGNOSIS — Z825 Family history of asthma and other chronic lower respiratory diseases: Secondary | ICD-10-CM

## 2018-11-10 DIAGNOSIS — Z1159 Encounter for screening for other viral diseases: Secondary | ICD-10-CM

## 2018-11-10 DIAGNOSIS — Z419 Encounter for procedure for purposes other than remedying health state, unspecified: Secondary | ICD-10-CM

## 2018-11-10 DIAGNOSIS — G43909 Migraine, unspecified, not intractable, without status migrainosus: Secondary | ICD-10-CM | POA: Diagnosis present

## 2018-11-10 DIAGNOSIS — H538 Other visual disturbances: Secondary | ICD-10-CM

## 2018-11-10 DIAGNOSIS — H5710 Ocular pain, unspecified eye: Secondary | ICD-10-CM

## 2018-11-10 LAB — CBC WITH DIFFERENTIAL/PLATELET
Abs Immature Granulocytes: 0.05 10*3/uL (ref 0.00–0.07)
Basophils Absolute: 0 10*3/uL (ref 0.0–0.1)
Basophils Relative: 0 %
Eosinophils Absolute: 0.1 10*3/uL (ref 0.0–0.5)
Eosinophils Relative: 1 %
HCT: 42.3 % (ref 36.0–46.0)
Hemoglobin: 13.7 g/dL (ref 12.0–15.0)
Immature Granulocytes: 1 %
Lymphocytes Relative: 24 %
Lymphs Abs: 2 10*3/uL (ref 0.7–4.0)
MCH: 31.9 pg (ref 26.0–34.0)
MCHC: 32.4 g/dL (ref 30.0–36.0)
MCV: 98.4 fL (ref 80.0–100.0)
Monocytes Absolute: 0.6 10*3/uL (ref 0.1–1.0)
Monocytes Relative: 7 %
Neutro Abs: 5.5 10*3/uL (ref 1.7–7.7)
Neutrophils Relative %: 67 %
Platelets: 257 10*3/uL (ref 150–400)
RBC: 4.3 MIL/uL (ref 3.87–5.11)
RDW: 12.8 % (ref 11.5–15.5)
WBC: 8.2 10*3/uL (ref 4.0–10.5)
nRBC: 0 % (ref 0.0–0.2)

## 2018-11-10 LAB — BASIC METABOLIC PANEL
Anion gap: 9 (ref 5–15)
BUN: 13 mg/dL (ref 6–20)
CO2: 25 mmol/L (ref 22–32)
Calcium: 8.6 mg/dL — ABNORMAL LOW (ref 8.9–10.3)
Chloride: 105 mmol/L (ref 98–111)
Creatinine, Ser: 0.84 mg/dL (ref 0.44–1.00)
GFR calc Af Amer: >60 mL/min (ref >60)
GFR calc non Af Amer: >60 mL/min (ref >60)
Glucose, Bld: 114 mg/dL — ABNORMAL HIGH (ref 70–99)
Potassium: 3.8 mmol/L (ref 3.5–5.1)
Sodium: 139 mmol/L (ref 135–145)

## 2018-11-10 LAB — MAGNESIUM: Magnesium: 2.4 mg/dL (ref 1.7–2.4)

## 2018-11-10 LAB — LACTIC ACID, PLASMA: Lactic Acid, Venous: 1.4 mmol/L (ref 0.5–1.9)

## 2018-11-10 LAB — SARS CORONAVIRUS 2 BY RT PCR (HOSPITAL ORDER, PERFORMED IN ~~LOC~~ HOSPITAL LAB): SARS Coronavirus 2: NEGATIVE

## 2018-11-10 MED ORDER — GADOBUTROL 1 MMOL/ML IV SOLN
5.0000 mL | Freq: Once | INTRAVENOUS | Status: AC | PRN
  Administered 2018-11-10: 5 mL via INTRAVENOUS

## 2018-11-10 NOTE — ED Notes (Signed)
Patient transported to MRI 

## 2018-11-10 NOTE — ED Notes (Signed)
Pt returned from MRI.  Pt remains alert and oriented x's 3.

## 2018-11-10 NOTE — ED Triage Notes (Signed)
Pt states she has had blurry vision since Wednesday. Pt went to eye MD today was sent here to have an MRI. Pt's MD concerned she may have transverse myelitis. Pt recently treated for shingles with no rash on July 1.

## 2018-11-10 NOTE — ED Provider Notes (Signed)
[facility] EMERGENCY DEPARTMENT Provider Note   CSN: [id number] Arrival date & time: 11/10/18  1636     History   Chief Complaint Chief Complaint  Patient presents with   Blurred Vision    HPI [name] is a 53 y.o. female.  She has no significant past medical history.  She said she is felt sick for about a month with intermittent fevers and body aches.  She had a pain in her right axilla flank area that her PCP told her was probably shingles and put her on prednisone and valacyclovir for 2 weeks.  She said she never broke out in the rash and that symptom has resolved.  She has had intermittent headaches mostly retro-orbital and noticed some blurred vision.  She has the sensation on her right eye of a black circle.  She saw the ophthalmologist today Dr. Shirleen Schirmer who dilated her eyes and said that she had papilledema and referred her here for neuro eval.      The history is provided by the patient.  Eye Problem Location:  Both eyes Quality:  Aching Severity:  Moderate Onset quality:  Gradual Timing:  Intermittent Progression:  Unchanged Chronicity:  New Context: not direct trauma   Relieved by:  Nothing Worsened by:  Nothing Ineffective treatments:  None tried Associated symptoms: blurred vision, headaches and scotomas   Associated symptoms: no double vision, no itching, no nausea, no numbness, no photophobia, no redness, no tearing, no tingling, no vomiting and no weakness     Past Medical History:  Diagnosis Date   Arthritis     There are no active problems to display for this patient.   Past Surgical History:  Procedure Laterality Date   CESAREAN SECTION       OB History   No obstetric history on file.      Home Medications    Prior to Admission medications   Medication Sig Start Date End Date Taking? Authorizing Provider  cyclobenzaprine (FLEXERIL) 5 MG tablet Take 1 tablet (5 mg total) by mouth 3 (three) times daily as needed  for muscle spasms. 08/30/15   Jaynee Eagles, PA-C  ibuprofen (ADVIL,MOTRIN) 800 MG tablet Take 1 tablet (800 mg total) by mouth 3 (three) times daily. 03/14/18   Ward, Ozella Almond, PA-C  methocarbamol (ROBAXIN) 500 MG tablet Take 1 tablet (500 mg total) by mouth 2 (two) times daily as needed. 03/14/18   Ward, Ozella Almond, PA-C  naproxen sodium (ANAPROX DS) 550 MG tablet Take 1 tablet (550 mg total) by mouth 2 (two) times daily with a meal. 08/30/15   Jaynee Eagles, PA-C    Family History Family History  Problem Relation Age of Onset   COPD Mother    Emphysema Mother    Cancer Father     Social History Social History   Tobacco Use   Smoking status: Never Smoker   Smokeless tobacco: Never Used  Substance Use Topics   Alcohol use: Yes    Alcohol/week: 2.0 standard drinks    Types: 2 Glasses of wine per week   Drug use: No     Allergies   Patient has no known allergies.   Review of Systems Review of Systems  Constitutional: Positive for fever.  HENT: Negative for sore throat.   Eyes: Positive for blurred vision and visual disturbance. Negative for double vision, photophobia, redness and itching.  Respiratory: Negative for shortness of breath.   Cardiovascular: Negative for chest pain.  Gastrointestinal: Negative  for abdominal pain, nausea and vomiting.  Genitourinary: Negative for dysuria.  Musculoskeletal: Positive for arthralgias.  Skin: Negative for rash.  Neurological: Positive for headaches. Negative for tingling, weakness and numbness.     Physical Exam Updated Vital Signs BP 119/69 (BP Location: Right Arm)    Pulse 98    Temp 100.1 F (37.8 C) (Oral)    Resp 18    Ht 5' (1.524 m)    LMP 05/01/2012    SpO2 98%    BMI 29.29 kg/m   Physical Exam Vitals signs and nursing note reviewed.  Constitutional:      General: She is not in acute distress.    Appearance: She is well-developed.  HENT:     Head: Normocephalic and atraumatic.  Eyes:     General:  Lids are normal.     Extraocular Movements: Extraocular movements intact.     Conjunctiva/sclera: Conjunctivae normal.     Right eye: Right conjunctiva is not injected.     Left eye: Left conjunctiva is not injected.     Pupils: Pupils are equal, round, and reactive to light.     Funduscopic exam:    Right eye: No hemorrhage.        Left eye: No hemorrhage.     Slit lamp exam:    Right eye: Anterior chamber quiet.     Left eye: Anterior chamber quiet.     Comments: Pupils are quite large and probably 9 mm equal.  She had been dilated at the eye doctor.  Neck:     Musculoskeletal: Neck supple.  Cardiovascular:     Rate and Rhythm: Normal rate and regular rhythm.     Heart sounds: No murmur.  Pulmonary:     Effort: Pulmonary effort is normal. No respiratory distress.     Breath sounds: Normal breath sounds.  Abdominal:     Palpations: Abdomen is soft.     Tenderness: There is no abdominal tenderness.  Skin:    General: Skin is warm and dry.  Neurological:     General: No focal deficit present.     Mental Status: She is alert.     GCS: GCS eye subscore is 4. GCS verbal subscore is 5. GCS motor subscore is 6.     Cranial Nerves: Cranial nerves are intact.     Motor: Motor function is intact.     Gait: Gait is intact.      ED Treatments / Results  Labs (all labs ordered are listed, but only abnormal results are displayed) Labs Reviewed  BASIC METABOLIC PANEL - Abnormal; Notable for the following components:      Result Value   Glucose, Bld 114 (*)    Calcium 8.6 (*)    All other components within normal limits  SEDIMENTATION RATE - Abnormal; Notable for the following components:   Sed Rate 51 (*)    All other components within normal limits  C-REACTIVE PROTEIN - Abnormal; Notable for the following components:   CRP 1.9 (*)    All other components within normal limits  CSF CELL COUNT WITH DIFFERENTIAL - Abnormal; Notable for the following components:   Color, CSF CLEAR  (*)    Appearance, CSF COLORLESS (*)    RBC Count, CSF 5 (*)    WBC, CSF 6 (*)    All other components within normal limits  BASIC METABOLIC PANEL - Abnormal; Notable for the following components:   Glucose, Bld 129 (*)    Calcium 8.6 (*)  All other components within normal limits  CULTURE, BLOOD (ROUTINE X 2)  CULTURE, BLOOD (ROUTINE X 2)  SARS CORONAVIRUS 2 (HOSPITAL ORDER, [facility])  CBC WITH DIFFERENTIAL/PLATELET  LACTIC ACID, PLASMA  LACTIC ACID, PLASMA  MAGNESIUM  PROTEIN AND GLUCOSE, CSF  HIV ANTIBODY (ROUTINE TESTING W REFLEX)  CBC  FLUORESCENT TREPONEMAL AB(FTA)-IGG-BLD  QUANTIFERON-TB GOLD PLUS  B. BURGDORFI ANTIBODIES  SURGICAL PATHOLOGY    EKG None  Radiology Ct Angio Head W Or Wo Contrast  Result Date: 11/11/2018 CLINICAL DATA:  Blurred vision of the right eye. Headache, acute, normal neuro exam. Abnormal ophthalmologic exam. EXAM: CT ANGIOGRAPHY HEAD TECHNIQUE: Multidetector CT imaging of the head was performed using the standard protocol during bolus administration of intravenous contrast. Multiplanar CT image reconstructions and MIPs were obtained to evaluate the vascular anatomy. CONTRAST:  17mL OMNIPAQUE IOHEXOL 350 MG/ML SOLN COMPARISON:  MRI brain and orbits 11/10/2018 FINDINGS: CT HEAD Brain: CT head without and with contrast is within normal limits. No acute infarct, hemorrhage, or mass lesion is present. No significant white matter lesions are present. The ventricles are of normal size. No significant extraaxial fluid collection is present. Vascular: No hyperdense vessel or unexpected calcification. Skull: Calvarium is intact. No focal lytic or blastic lesions are present. Sinuses: The paranasal sinuses and mastoid air cells are clear. Orbits: The globes and orbits are within normal limits. CTA HEAD Anterior circulation: Internal carotid arteries are within normal limits from the high cervical segments through the ICA termini. A1  and M1 segments are normal. ACA and MCA branch vessels are normal. MCA bifurcations are within normal limits. Focal vascular abnormality is present in the orbits. The ophthalmic artery is not discretely visualized. Superior ophthalmic veins are normal bilaterally. Posterior circulation: The vertebral arteries are codominant. PICA origin origins are visualized and normal. Basilar artery is normal. Both posterior cerebral arteries originate from the basilar tip. Prominent posterior communicating arteries present on right. PCA branch vessels are normal. Venous sinuses: Dural sinuses are patent. The right transverse sinus is dominant. Straight sinus deep cerebral veins are intact. Cortical veins are unremarkable. Anatomic variants: Prominent right posterior communicating artery. Delayed phase: No pathologic enhancement IMPRESSION: Normal CTA circle-of-Willis without significant proximal stenosis, aneurysm, or branch vessel occlusion. No focal vascular abnormality within the orbits. Electronically Signed   By: San Morelle M.D.   On: 11/11/2018 12:23   Mr Jeri Cos And Wo Contrast  Result Date: 11/10/2018 CLINICAL DATA:  Papilledema with new headache that started 7 weeks ago. Blurry vision beginning last week. EXAM: MRI HEAD AND ORBITS WITHOUT AND WITH CONTRAST TECHNIQUE: Multiplanar, multiecho pulse sequences of the brain and surrounding structures were obtained without and with intravenous contrast. Multiplanar, multiecho pulse sequences of the orbits and surrounding structures were obtained including fat saturation techniques, before and after intravenous contrast administration. CONTRAST:  5 mL Gadavist COMPARISON:  None. FINDINGS: MRI HEAD FINDINGS BRAIN: There is no acute infarct, acute hemorrhage or extra-axial collection. The midline structures are normal. There is no midline shift or mass effect. The white matter signal is normal for the patient's age. The cerebral and cerebellar volume are  age-appropriate. There is no hydrocephalus. Susceptibility-sensitive sequences show no chronic microhemorrhage or superficial siderosis. VASCULAR: The major intracranial arterial and venous sinus flow voids are normal. SKULL AND UPPER CERVICAL SPINE: Calvarial bone marrow signal is normal. There is no skull base mass. The visualized upper cervical spine and soft tissues are normal. MRI ORBITS FINDINGS Orbits: --Globes: Normal. --Bony  orbit: Normal. --Preseptal soft tissues: Normal. --Intra- and extraconal orbital fat: Normal. No inflammatory stranding. --Optic nerves: Normal. --Lacrimal glands and fossae: Normal. --Extraocular muscles: Normal. Visualized sinuses:  No fluid levels or advanced mucosal thickening. Soft tissues: Normal. Limited intracranial: Normal. IMPRESSION: Normal brain and orbits. Electronically Signed   By: Ulyses Jarred M.D.   On: 11/10/2018 23:04   Mr Cervical Spine W Wo Contrast  Result Date: 11/11/2018 CLINICAL DATA:  Papilledema and headache EXAM: MRI CERVICAL SPINE WITHOUT AND WITH CONTRAST TECHNIQUE: Multiplanar and multiecho pulse sequences of the cervical spine, to include the craniocervical junction and cervicothoracic junction, were obtained without and with intravenous contrast. CONTRAST:  5 mL Gadavist COMPARISON:  None. FINDINGS: Alignment: Grade 1 anterolisthesis at C4-5. Vertebrae: No fracture, evidence of discitis, or bone lesion. Cord: Normal signal and morphology. Posterior Fossa, vertebral arteries, paraspinal tissues: Negative. Disc levels: C1-2: Normal. C2-3: No disc herniation or stenosis. C3-4: Left facet hypertrophy. No spinal canal or neural foraminal stenosis. No disc herniation. C4-5: No disc herniation or stenosis.  Mild right facet hypertrophy. C5-6: Left-greater-than-right facet hypertrophy. No spinal canal or neural foraminal stenosis. C6-7: Mild uncovertebral hypertrophy, left-greater-than-right with mild bilateral foraminal narrowing. C7-T1: No disc herniation  or stenosis. T1-2: Normal. T2-3: Normal. IMPRESSION: 1. No cervical spinal cord abnormality. 2. Mild multilevel facet arthrosis with mild bilateral C7 neural foraminal narrowing. 3. No cervical spinal canal stenosis. Electronically Signed   By: Ulyses Jarred M.D.   On: 11/11/2018 00:07   Mr Rosealee Albee UY Contrast  Result Date: 11/10/2018 CLINICAL DATA:  Papilledema with new headache that started 7 weeks ago. Blurry vision beginning last week. EXAM: MRI HEAD AND ORBITS WITHOUT AND WITH CONTRAST TECHNIQUE: Multiplanar, multiecho pulse sequences of the brain and surrounding structures were obtained without and with intravenous contrast. Multiplanar, multiecho pulse sequences of the orbits and surrounding structures were obtained including fat saturation techniques, before and after intravenous contrast administration. CONTRAST:  5 mL Gadavist COMPARISON:  None. FINDINGS: MRI HEAD FINDINGS BRAIN: There is no acute infarct, acute hemorrhage or extra-axial collection. The midline structures are normal. There is no midline shift or mass effect. The white matter signal is normal for the patient's age. The cerebral and cerebellar volume are age-appropriate. There is no hydrocephalus. Susceptibility-sensitive sequences show no chronic microhemorrhage or superficial siderosis. VASCULAR: The major intracranial arterial and venous sinus flow voids are normal. SKULL AND UPPER CERVICAL SPINE: Calvarial bone marrow signal is normal. There is no skull base mass. The visualized upper cervical spine and soft tissues are normal. MRI ORBITS FINDINGS Orbits: --Globes: Normal. --Bony orbit: Normal. --Preseptal soft tissues: Normal. --Intra- and extraconal orbital fat: Normal. No inflammatory stranding. --Optic nerves: Normal. --Lacrimal glands and fossae: Normal. --Extraocular muscles: Normal. Visualized sinuses:  No fluid levels or advanced mucosal thickening. Soft tissues: Normal. Limited intracranial: Normal. IMPRESSION: Normal brain  and orbits. Electronically Signed   By: Ulyses Jarred M.D.   On: 11/10/2018 23:04    Procedures Procedures (including critical care time)  Medications Ordered in ED Medications - No data to display   Initial Impression / Assessment and Plan / ED Course  I have reviewed the triage vital signs and the nursing notes.  Pertinent labs & imaging results that were available during my care of the patient were reviewed by me and considered in my medical decision making (see chart for details).  Clinical Course as of Nov 11 1531  Tue Jul 21, 841  [id number] 53 year old female here with intermittent fever redness  and joint and body aches with a headache mostly retro-orbital and blurry vision.  Eye doctor evaluated her and found papilledema and sent here for neurologic evaluation.   [MB]  [id number] The ophthalmologist note comments upon her papilledema and her recent possible shingles and concern she may have transverse myelitis.  I reviewed this with Dr. Lorraine Lax from neurology who said somebody would be down to see her.  We will start with screening labs and Covid testing.   [MB]  2001 Dr. Leonel Ramsay from neurology is recommending getting a MRI with and without contrast of brain orbits and cervical spine.  He will evaluate the patient after he finishes another consult.  I will update the patient on the plan.   [MB]    Clinical Course User Index [MB] Hayden Rasmussen, MD        Final Clinical Impressions(s) / ED Diagnoses   Final diagnoses:  Blurry vision  Polyarthralgia  Retro-orbital pain, unspecified laterality    ED Discharge Orders    None       Hayden Rasmussen, MD 11/11/18 1534

## 2018-11-11 ENCOUNTER — Inpatient Hospital Stay (HOSPITAL_COMMUNITY): Payer: BC Managed Care – PPO | Admitting: Anesthesiology

## 2018-11-11 ENCOUNTER — Encounter (HOSPITAL_COMMUNITY): Payer: Self-pay | Admitting: *Deleted

## 2018-11-11 ENCOUNTER — Encounter (HOSPITAL_COMMUNITY)
Admission: EM | Disposition: A | Discharge status: Home / Self Care | Payer: Self-pay | Source: Home / Self Care | Attending: Internal Medicine

## 2018-11-11 ENCOUNTER — Inpatient Hospital Stay (HOSPITAL_COMMUNITY): Payer: BC Managed Care – PPO

## 2018-11-11 DIAGNOSIS — H3093 Unspecified chorioretinal inflammation, bilateral: Secondary | ICD-10-CM | POA: Diagnosis present

## 2018-11-11 DIAGNOSIS — H539 Unspecified visual disturbance: Secondary | ICD-10-CM | POA: Diagnosis not present

## 2018-11-11 DIAGNOSIS — Z825 Family history of asthma and other chronic lower respiratory diseases: Secondary | ICD-10-CM | POA: Diagnosis not present

## 2018-11-11 DIAGNOSIS — H3581 Retinal edema: Secondary | ICD-10-CM | POA: Diagnosis not present

## 2018-11-11 DIAGNOSIS — M316 Other giant cell arteritis: Secondary | ICD-10-CM | POA: Diagnosis not present

## 2018-11-11 DIAGNOSIS — M2550 Pain in unspecified joint: Secondary | ICD-10-CM

## 2018-11-11 DIAGNOSIS — H538 Other visual disturbances: Secondary | ICD-10-CM

## 2018-11-11 DIAGNOSIS — G43909 Migraine, unspecified, not intractable, without status migrainosus: Secondary | ICD-10-CM | POA: Diagnosis present

## 2018-11-11 DIAGNOSIS — H4710 Unspecified papilledema: Secondary | ICD-10-CM | POA: Diagnosis not present

## 2018-11-11 DIAGNOSIS — Z1159 Encounter for screening for other viral diseases: Secondary | ICD-10-CM | POA: Diagnosis not present

## 2018-11-11 DIAGNOSIS — M1990 Unspecified osteoarthritis, unspecified site: Secondary | ICD-10-CM | POA: Diagnosis present

## 2018-11-11 DIAGNOSIS — R51 Headache: Secondary | ICD-10-CM | POA: Diagnosis not present

## 2018-11-11 DIAGNOSIS — H53411 Scotoma involving central area, right eye: Secondary | ICD-10-CM | POA: Diagnosis present

## 2018-11-11 DIAGNOSIS — Z809 Family history of malignant neoplasm, unspecified: Secondary | ICD-10-CM | POA: Diagnosis not present

## 2018-11-11 DIAGNOSIS — Z791 Long term (current) use of non-steroidal anti-inflammatories (NSAID): Secondary | ICD-10-CM | POA: Diagnosis not present

## 2018-11-11 HISTORY — PX: ARTERY BIOPSY: SHX891

## 2018-11-11 LAB — LACTIC ACID, PLASMA: Lactic Acid, Venous: 1.4 mmol/L (ref 0.5–1.9)

## 2018-11-11 LAB — CBC
HCT: 41.2 % (ref 36.0–46.0)
Hemoglobin: 13.7 g/dL (ref 12.0–15.0)
MCH: 32.5 pg (ref 26.0–34.0)
MCHC: 33.3 g/dL (ref 30.0–36.0)
MCV: 97.9 fL (ref 80.0–100.0)
Platelets: 250 10*3/uL (ref 150–400)
RBC: 4.21 MIL/uL (ref 3.87–5.11)
RDW: 12.8 % (ref 11.5–15.5)
WBC: 7.8 10*3/uL (ref 4.0–10.5)
nRBC: 0 % (ref 0.0–0.2)

## 2018-11-11 LAB — BASIC METABOLIC PANEL
Anion gap: 10 (ref 5–15)
BUN: 12 mg/dL (ref 6–20)
CO2: 24 mmol/L (ref 22–32)
Calcium: 8.6 mg/dL — ABNORMAL LOW (ref 8.9–10.3)
Chloride: 104 mmol/L (ref 98–111)
Creatinine, Ser: 0.81 mg/dL (ref 0.44–1.00)
GFR calc Af Amer: >60 mL/min (ref >60)
GFR calc non Af Amer: >60 mL/min (ref >60)
Glucose, Bld: 129 mg/dL — ABNORMAL HIGH (ref 70–99)
Potassium: 4.2 mmol/L (ref 3.5–5.1)
Sodium: 138 mmol/L (ref 135–145)

## 2018-11-11 LAB — HIV ANTIBODY (ROUTINE TESTING W REFLEX): HIV Screen 4th Generation wRfx: NONREACTIVE

## 2018-11-11 LAB — PROTEIN AND GLUCOSE, CSF
Glucose, CSF: 59 mg/dL (ref 40–70)
Total  Protein, CSF: 40 mg/dL (ref 15–45)

## 2018-11-11 LAB — CSF CELL COUNT WITH DIFFERENTIAL
RBC Count, CSF: 5 /mm3 — ABNORMAL HIGH
Tube #: 3
WBC, CSF: 6 /mm3 — ABNORMAL HIGH (ref 0–5)

## 2018-11-11 LAB — C-REACTIVE PROTEIN: CRP: 1.9 mg/dL — ABNORMAL HIGH (ref <1.0)

## 2018-11-11 LAB — SEDIMENTATION RATE: Sed Rate: 51 mm/h — ABNORMAL HIGH (ref 0–22)

## 2018-11-11 SURGERY — BIOPSY TEMPORAL ARTERY
Anesthesia: General | Site: Head | Laterality: Right

## 2018-11-11 MED ORDER — MIDAZOLAM HCL 5 MG/5ML IJ SOLN
INTRAMUSCULAR | Status: DC | PRN
  Administered 2018-11-11: 2 mg via INTRAVENOUS

## 2018-11-11 MED ORDER — FENTANYL CITRATE (PF) 250 MCG/5ML IJ SOLN
INTRAMUSCULAR | Status: DC | PRN
  Administered 2018-11-11 (×3): 50 ug via INTRAVENOUS

## 2018-11-11 MED ORDER — ACETAMINOPHEN 500 MG PO TABS
ORAL_TABLET | ORAL | Status: AC
  Administered 2018-11-11: 1000 mg via ORAL
  Filled 2018-11-11: qty 2

## 2018-11-11 MED ORDER — OXYCODONE-ACETAMINOPHEN 5-325 MG PO TABS: 1.0000 | ORAL_TABLET | Freq: Four times a day (QID) | ORAL | Status: DC | PRN

## 2018-11-11 MED ORDER — ONDANSETRON HCL 4 MG/2ML IJ SOLN
INTRAMUSCULAR | Status: DC | PRN
  Administered 2018-11-11: 4 mg via INTRAVENOUS

## 2018-11-11 MED ORDER — SCOPOLAMINE 1 MG/3DAYS TD PT72
MEDICATED_PATCH | TRANSDERMAL | Status: AC
  Administered 2018-11-11: 1.5 mg via TRANSDERMAL
  Filled 2018-11-11: qty 1

## 2018-11-11 MED ORDER — SUGAMMADEX SODIUM 200 MG/2ML IV SOLN
INTRAVENOUS | Status: DC | PRN
  Administered 2018-11-11: 134.6 mg via INTRAVENOUS

## 2018-11-11 MED ORDER — ROCURONIUM BROMIDE 10 MG/ML (PF) SYRINGE
PREFILLED_SYRINGE | INTRAVENOUS | Status: DC | PRN
  Administered 2018-11-11: 50 mg via INTRAVENOUS

## 2018-11-11 MED ORDER — LIDOCAINE 2% (20 MG/ML) 5 ML SYRINGE
INTRAMUSCULAR | Status: DC | PRN
  Administered 2018-11-11: 50 mg via INTRAVENOUS

## 2018-11-11 MED ORDER — ACETAMINOPHEN 650 MG RE SUPP: 650.0000 mg | Freq: Four times a day (QID) | RECTAL | Status: DC | PRN

## 2018-11-11 MED ORDER — 0.9 % SODIUM CHLORIDE (POUR BTL) OPTIME
TOPICAL | Status: DC | PRN
  Administered 2018-11-11: 1000 mL

## 2018-11-11 MED ORDER — PANTOPRAZOLE SODIUM 40 MG PO TBEC
40.0000 mg | DELAYED_RELEASE_TABLET | Freq: Every day | ORAL | Status: DC
  Administered 2018-11-11 – 2018-11-14 (×4): 40 mg via ORAL
  Filled 2018-11-11 (×4): qty 1

## 2018-11-11 MED ORDER — ROCURONIUM BROMIDE 10 MG/ML (PF) SYRINGE
PREFILLED_SYRINGE | INTRAVENOUS | Status: AC
  Filled 2018-11-11: qty 10

## 2018-11-11 MED ORDER — CEFAZOLIN SODIUM-DEXTROSE 1-4 GM/50ML-% IV SOLN
INTRAVENOUS | Status: AC
  Filled 2018-11-11: qty 50

## 2018-11-11 MED ORDER — ACETAMINOPHEN 325 MG PO TABS: 650.0000 mg | ORAL_TABLET | Freq: Four times a day (QID) | ORAL | Status: DC | PRN

## 2018-11-11 MED ORDER — METHYLPREDNISOLONE SODIUM SUCC 125 MG IJ SOLR: 125.0000 mg | Freq: Once | INTRAMUSCULAR | Status: DC

## 2018-11-11 MED ORDER — ACETAMINOPHEN 500 MG PO TABS
1000.0000 mg | ORAL_TABLET | Freq: Once | ORAL | Status: AC
  Administered 2018-11-11: 1000 mg via ORAL

## 2018-11-11 MED ORDER — SODIUM CHLORIDE 0.9 % IV SOLN
INTRAVENOUS | Status: DC
  Administered 2018-11-11: 13:00:00 via INTRAVENOUS

## 2018-11-11 MED ORDER — LACTATED RINGERS IV SOLN
INTRAVENOUS | Status: DC | PRN
  Administered 2018-11-11: 13:00:00 via INTRAVENOUS

## 2018-11-11 MED ORDER — FENTANYL CITRATE (PF) 100 MCG/2ML IJ SOLN: 25.0000 ug | INTRAMUSCULAR | Status: DC | PRN

## 2018-11-11 MED ORDER — LIDOCAINE 2% (20 MG/ML) 5 ML SYRINGE
INTRAMUSCULAR | Status: AC
  Filled 2018-11-11: qty 5

## 2018-11-11 MED ORDER — LIDOCAINE-EPINEPHRINE 0.5 %-1:200000 IJ SOLN
INTRAMUSCULAR | Status: AC
  Filled 2018-11-11: qty 1

## 2018-11-11 MED ORDER — ONDANSETRON HCL 4 MG PO TABS: 4.0000 mg | ORAL_TABLET | Freq: Four times a day (QID) | ORAL | Status: DC | PRN

## 2018-11-11 MED ORDER — SODIUM CHLORIDE 0.9 % IV SOLN
1000.0000 mg | Freq: Every day | INTRAVENOUS | Status: AC
  Administered 2018-11-11 – 2018-11-13 (×3): 1000 mg via INTRAVENOUS
  Filled 2018-11-11 (×3): qty 8

## 2018-11-11 MED ORDER — DEXAMETHASONE SODIUM PHOSPHATE 10 MG/ML IJ SOLN
INTRAMUSCULAR | Status: DC | PRN
  Administered 2018-11-11: 10 mg via INTRAVENOUS

## 2018-11-11 MED ORDER — CEFAZOLIN SODIUM-DEXTROSE 1-4 GM/50ML-% IV SOLN
INTRAVENOUS | Status: DC | PRN
  Administered 2018-11-11: 1 g via INTRAVENOUS

## 2018-11-11 MED ORDER — ONDANSETRON HCL 4 MG/2ML IJ SOLN: 4.0000 mg | Freq: Four times a day (QID) | INTRAMUSCULAR | Status: DC | PRN

## 2018-11-11 MED ORDER — DEXAMETHASONE SODIUM PHOSPHATE 10 MG/ML IJ SOLN
INTRAMUSCULAR | Status: AC
  Filled 2018-11-11: qty 1

## 2018-11-11 MED ORDER — IOHEXOL 350 MG/ML SOLN
75.0000 mL | Freq: Once | INTRAVENOUS | Status: AC | PRN
  Administered 2018-11-11: 75 mL via INTRAVENOUS

## 2018-11-11 MED ORDER — MIDAZOLAM HCL 2 MG/2ML IJ SOLN
INTRAMUSCULAR | Status: AC
  Filled 2018-11-11: qty 2

## 2018-11-11 MED ORDER — PHENYLEPHRINE 40 MCG/ML (10ML) SYRINGE FOR IV PUSH (FOR BLOOD PRESSURE SUPPORT)
PREFILLED_SYRINGE | INTRAVENOUS | Status: DC | PRN
  Administered 2018-11-11 (×7): 80 ug via INTRAVENOUS

## 2018-11-11 MED ORDER — SCOPOLAMINE 1 MG/3DAYS TD PT72
1.0000 | MEDICATED_PATCH | Freq: Once | TRANSDERMAL | Status: DC
  Administered 2018-11-11: 1.5 mg via TRANSDERMAL
  Filled 2018-11-11: qty 1

## 2018-11-11 MED ORDER — PROPOFOL 10 MG/ML IV BOLUS
INTRAVENOUS | Status: DC | PRN
  Administered 2018-11-11: 150 mg via INTRAVENOUS

## 2018-11-11 MED ORDER — FENTANYL CITRATE (PF) 250 MCG/5ML IJ SOLN
INTRAMUSCULAR | Status: AC
  Filled 2018-11-11: qty 5

## 2018-11-11 MED ORDER — PROPOFOL 10 MG/ML IV BOLUS
INTRAVENOUS | Status: AC
  Filled 2018-11-11: qty 20

## 2018-11-11 SURGICAL SUPPLY — 39 items
ADH SKN CLS APL DERMABOND .7 (GAUZE/BANDAGES/DRESSINGS) ×1
BALL CTTN LRG ABS STRL LF (GAUZE/BANDAGES/DRESSINGS) ×1
CANISTER SUCT 3000ML PPV (MISCELLANEOUS) ×3 IMPLANT
CONT SPEC 4OZ CLIKSEAL STRL BL (MISCELLANEOUS) ×3 IMPLANT
COTTONBALL LRG STERILE PKG (GAUZE/BANDAGES/DRESSINGS) ×3 IMPLANT
COVER SURGICAL LIGHT HANDLE (MISCELLANEOUS) ×5 IMPLANT
COVER WAND RF STERILE (DRAPES) ×3 IMPLANT
DECANTER SPIKE VIAL GLASS SM (MISCELLANEOUS) ×1 IMPLANT
DERMABOND ADVANCED (GAUZE/BANDAGES/DRESSINGS) ×2
DERMABOND ADVANCED .7 DNX12 (GAUZE/BANDAGES/DRESSINGS) ×1 IMPLANT
DRAPE BRACHIAL (DRAPES) ×1 IMPLANT
DRAPE HALF SHEET 40X57 (DRAPES) ×3 IMPLANT
DRAPE ORTHO SPLIT 77X108 STRL (DRAPES) ×3
DRAPE SURG ORHT 6 SPLT 77X108 (DRAPES) IMPLANT
ELECT REM PT RETURN 9FT ADLT (ELECTROSURGICAL) ×3
ELECTRODE REM PT RTRN 9FT ADLT (ELECTROSURGICAL) ×1 IMPLANT
GLOVE SS BIOGEL STRL SZ 7.5 (GLOVE) ×1 IMPLANT
GLOVE SUPERSENSE BIOGEL SZ 7.5 (GLOVE) ×2
GOWN STRL REUS W/ TWL LRG LVL3 (GOWN DISPOSABLE) ×3 IMPLANT
GOWN STRL REUS W/TWL LRG LVL3 (GOWN DISPOSABLE) ×6
KIT BASIN OR (CUSTOM PROCEDURE TRAY) ×3 IMPLANT
KIT TURNOVER KIT B (KITS) ×3 IMPLANT
LOOP VESSEL MAXI BLUE (MISCELLANEOUS) ×3 IMPLANT
NDL HYPO 25GX1X1/2 BEV (NEEDLE) ×1 IMPLANT
NEEDLE HYPO 25GX1X1/2 BEV (NEEDLE) ×3 IMPLANT
NS IRRIG 1000ML POUR BTL (IV SOLUTION) ×3 IMPLANT
PACK GENERAL/GYN (CUSTOM PROCEDURE TRAY) ×3 IMPLANT
PAD ARMBOARD 7.5X6 YLW CONV (MISCELLANEOUS) ×6 IMPLANT
SPONGE LAP 4X18 RFD (DISPOSABLE) ×3 IMPLANT
SUT SILK 3 0 (SUTURE) ×2
SUT SILK 3-0 18XBRD TIE 12 (SUTURE) ×1 IMPLANT
SUT SILK 4 0 TIES 17X18 (SUTURE) ×3 IMPLANT
SUT VIC AB 3-0 SH 27 (SUTURE) ×2
SUT VIC AB 3-0 SH 27X BRD (SUTURE) ×1 IMPLANT
SUT VIC AB 4-0 PS2 18 (SUTURE) ×2 IMPLANT
SUT VICRYL 4-0 PS2 18IN ABS (SUTURE) ×3 IMPLANT
SYR CONTROL 10ML LL (SYRINGE) ×3 IMPLANT
TOWEL GREEN STERILE (TOWEL DISPOSABLE) ×3 IMPLANT
WATER STERILE IRR 1000ML POUR (IV SOLUTION) IMPLANT

## 2018-11-11 NOTE — ED Notes (Signed)
Pt tolerated Lumbar Pucture procedure without any problems

## 2018-11-11 NOTE — Consult Note (Addendum)
[name] NOTE   MRN : [id number]  Reason for Consult: Temporal artery  Referring Physician: Dr. Leonel Ramsay  History of Present Illness:52 y/o female with a 6 week history of HA and recent vision changes in the right eye.  She states when she closes he left eye she sees a dark" cloud" over her central right eye visual field.    Prior to this she was diagnosed with Shingles and treated.  She had no external visible shingles, but she did have a fever.  She denise weakness, loss of vision, or aphasia.    Past medical history includes: neg for DM, CAD, HTN.     Current Facility-Administered Medications  Medication Dose Route Frequency Provider Last Rate Last Dose  . acetaminophen (TYLENOL) tablet 650 mg  650 mg Oral Q6H PRN Etta Quill, DO       Or  . acetaminophen (TYLENOL) suppository 650 mg  650 mg Rectal Q6H PRN Etta Quill, DO      . methylPREDNISolone sodium succinate (SOLU-MEDROL) 1,000 mg in sodium chloride 0.9 % 50 mL IVPB  1,000 mg Intravenous Daily Greta Doom, MD 58 mL/hr at 11/11/18 0500    . ondansetron (ZOFRAN) tablet 4 mg  4 mg Oral Q6H PRN Etta Quill, DO       Or  . ondansetron [facility]) injection 4 mg  4 mg Intravenous Q6H PRN Etta Quill, DO      . pantoprazole (PROTONIX) EC tablet 40 mg  40 mg Oral Daily Alcario Drought, Jared M, DO        Pt meds include: Statin :No Betablocker: No ASA: No Other anticoagulants/antiplatelets: None  Past Medical History:  Diagnosis Date  . Arthritis     Past Surgical History:  Procedure Laterality Date  . CESAREAN SECTION      Social History Social History   Tobacco Use  . Smoking status: Never Smoker  . Smokeless tobacco: Never Used  Substance Use Topics  . Alcohol use: Yes    Alcohol/week: 2.0 standard drinks    Types: 2 Glasses of wine per week  . Drug use: No    Family History Family History  Problem Relation Age of Onset  . COPD Mother   .  Emphysema Mother   . Cancer Father     No Known Allergies   REVIEW OF SYSTEMS  General: [ ]  Weight loss, [ ]  Fever, [ ]  chills Neurologic: [ ]  Dizziness, [ ]  Blackouts, [ ]  Seizure [ ]  Stroke, [ ]  "Mini stroke", [ ]  Slurred speech, [ ]  Temporary blindness; [ ]  weakness in arms or legs, [ ]  Hoarseness [ ]  Dysphagia [x]  HA Cardiac: [ ]  Chest pain/pressure, [ ]  Shortness of breath at rest [ ]  Shortness of breath with exertion, [ ]  Atrial fibrillation or irregular heartbeat  Vascular: [ ]  Pain in legs with walking, [ ]  Pain in legs at rest, [ ]  Pain in legs at night,  [ ]  Non-healing ulcer, [ ]  Blood clot in vein/DVT,   Pulmonary: [ ]  Home oxygen, [ ]  Productive cough, [ ]  Coughing up blood, [ ]  Asthma,  [ ]  Wheezing [ ]  COPD Musculoskeletal:  [x ] Arthritis, [ ]  Low back pain, [ ]  Joint pain Hematologic: [ ]  Easy Bruising, [ ]  Anemia; [ ]  Hepatitis Gastrointestinal: [ ]  Blood in stool, [ ]  Gastroesophageal Reflux/heartburn, Urinary: [ ]  chronic Kidney disease, [ ]  on HD - [ ]  MWF or [ ]   TTHS, [ ]  Burning with urination, [ ]  Difficulty urinating Skin: [ ]  Rashes, [ ]  Wounds Psychological: [ ]  Anxiety, [ ]  Depression  Physical Examination Vitals:   11/11/18 0000 11/11/18 0100 11/11/18 0416 11/11/18 0427  BP: (!) 157/93 (!) 141/105 (!) 150/78   Pulse:  (!) 107 92   Resp:      Temp:   98.9 F (37.2 C)   TempSrc:   Oral   SpO2:  99% 99%   Weight:    67.3 kg  Height:       Body mass index is 28.98 kg/m.  General:  WDWN in NAD HENT: WNL Eyes: Pupils equal Pulmonary: normal non-labored breathing , without Rales, rhonchi,  wheezing Cardiac: RRR, without  Murmurs, rubs or gallops; No carotid bruits Abdomen: soft, NT, no masses Skin: no rashes, ulcers noted;  no Gangrene , no cellulitis; no open wounds;   Vascular Exam/Pulses:Palpable radial pulses B    Musculoskeletal: no muscle wasting or atrophy; no edema  Neurologic: A&O X 3; Appropriate Affect ;  SENSATION: normal; MOTOR  FUNCTION: 5/5 Symmetric Speech is fluent/normal   Significant Diagnostic Studies: CBC Lab Results  Component Value Date   WBC 7.8 11/11/2018   HGB 13.7 11/11/2018   HCT 41.2 11/11/2018   MCV 97.9 11/11/2018   PLT 250 11/11/2018    BMET    Component Value Date/Time   NA 138 11/11/2018 0321   K 4.2 11/11/2018 0321   CL 104 11/11/2018 0321   CO2 24 11/11/2018 0321   GLUCOSE 129 (H) 11/11/2018 0321   BUN 12 11/11/2018 0321   CREATININE 0.81 11/11/2018 0321   CREATININE 0.78 08/30/2015 1044   CALCIUM 8.6 (L) 11/11/2018 0321   GFRNONAA >60 11/11/2018 0321   GFRAA >60 11/11/2018 0321   Estimated Creatinine Clearance: 67.9 mL/min (by C-G formula based on SCr of 0.81 mg/dL).  COAG No results found for: INR, PROTIME     ASSESSMENT/PLAN:  Right eye visual field changes with HA Plan for right temporal artery biopsy to r/o temporal arteritis with Dr. Donnetta Hutching. NPO  [name] 11/11/2018 9:41 AM   I have examined the patient, reviewed and agree with above.  Discussed plan for diagnostic temporal artery biopsy.  Explained would take approximately 48 hours for final pathology.  Understands that this is diagnostic to help direct medical therapy for possible temporal arteritis  [name], MD 11/11/2018 10:11 AM

## 2018-11-11 NOTE — H&P (Signed)
History and Physical    [name] YTK:[id number] DOB: 09-03-1965 DOA: 11/10/2018  PCP: Patient, No Pcp Per  Patient coming from: Home  I have personally briefly reviewed patient's old medical records in [place]  Chief Complaint: Visual change  HPI: [name] is a 53 y.o. female with medical history significant of arthritis.  Patient presents to the ED with c/o multiple weeks (at least a month+) of generalized malaise, intermittent fevers, bitemporal headaches.  On Wed patient noticed she was having abnormal vision out of her right eye.  She states with both eyes open and appears blurry, but if she closes her right eye she sees normally.  She closes her left eye she notices on the nasal aspect an area of decreased vision, essentially describing scotoma.  Due to the visual changes she saw an ophthalmologist today who noted papilledema and referred her for neurological evaluation.  Denies Jaw claudication.  Does have some neck pain.   ED Course: LP performed, nl opening pressure.  ESR 51, CRP 1.9.  COVID negative.   Review of Systems: As per HPI, otherwise all review of systems negative.  Past Medical History:  Diagnosis Date   Arthritis     Past Surgical History:  Procedure Laterality Date   CESAREAN SECTION       reports that she has never smoked. She has never used smokeless tobacco. She reports current alcohol use of about 2.0 standard drinks of alcohol per week. She reports that she does not use drugs.  No Known Allergies  Family History  Problem Relation Age of Onset   COPD Mother    Emphysema Mother    Cancer Father      Prior to Admission medications   Medication Sig Start Date End Date Taking? Authorizing Provider  cyclobenzaprine (FLEXERIL) 5 MG tablet Take 1 tablet (5 mg total) by mouth 3 (three) times daily as needed for muscle spasms. 08/30/15   Jaynee Eagles, PA-C  ibuprofen (ADVIL,MOTRIN) 800 MG tablet Take 1 tablet (800 mg total) by  mouth 3 (three) times daily. 03/14/18   Ward, Ozella Almond, PA-C  methocarbamol (ROBAXIN) 500 MG tablet Take 1 tablet (500 mg total) by mouth 2 (two) times daily as needed. 03/14/18   Ward, Ozella Almond, PA-C  naproxen sodium (ANAPROX DS) 550 MG tablet Take 1 tablet (550 mg total) by mouth 2 (two) times daily with a meal. 08/30/15   Jaynee Eagles, PA-C    Physical Exam: Vitals:   11/10/18 2330 11/10/18 2345 11/11/18 0000 11/11/18 0100  BP: (!) 157/79 (!) 148/79 (!) 157/93 (!) 141/105  Pulse: (!) 106   (!) 107  Resp:      Temp:      TempSrc:      SpO2: 100%   99%  Height:        Constitutional: NAD, calm, comfortable Eyes: PERRL, lids and conjunctivae normal, papilledema in R eye ENMT: Mucous membranes are moist. Posterior pharynx clear of any exudate or lesions.Normal dentition.  Neck: normal, supple, no masses, no thyromegaly Respiratory: clear to auscultation bilaterally, no wheezing, no crackles. Normal respiratory effort. No accessory muscle use.  Cardiovascular: Regular rate and rhythm, no murmurs / rubs / gallops. No extremity edema. 2+ pedal pulses. No carotid bruits.  Abdomen: no tenderness, no masses palpated. No hepatosplenomegaly. Bowel sounds positive.  Musculoskeletal: no clubbing / cyanosis. No joint deformity upper and lower extremities. Good ROM, no contractures. Normal muscle tone.  Skin: no rashes, lesions, ulcers. No induration  Neurologic: CN 2-12 grossly intact. Sensation intact, DTR normal. Strength 5/5 in all 4.  Psychiatric: Normal judgment and insight. Alert and oriented x 3. Normal mood.    Labs on Admission: I have personally reviewed following labs and imaging studies  CBC: Recent Labs  Lab 11/10/18 1910  WBC 8.2  NEUTROABS 5.5  HGB 13.7  HCT 42.3  MCV 98.4  PLT 782   Basic Metabolic Panel: Recent Labs  Lab 11/10/18 1910  NA 139  K 3.8  CL 105  CO2 25  GLUCOSE 114*  BUN 13  CREATININE 0.84  CALCIUM 8.6*  MG 2.4   GFR: CrCl cannot be  calculated (Unknown ideal weight.). Liver Function Tests: No results for input(s): AST, ALT, ALKPHOS, BILITOT, PROT, ALBUMIN in the last 168 hours. No results for input(s): LIPASE, AMYLASE in the last 168 hours. No results for input(s): AMMONIA in the last 168 hours. Coagulation Profile: No results for input(s): INR, PROTIME in the last 168 hours. Cardiac Enzymes: No results for input(s): CKTOTAL, CKMB, CKMBINDEX, TROPONINI in the last 168 hours. BNP (last 3 results) No results for input(s): PROBNP in the last 8760 hours. HbA1C: No results for input(s): HGBA1C in the last 72 hours. CBG: No results for input(s): GLUCAP in the last 168 hours. Lipid Profile: No results for input(s): CHOL, HDL, LDLCALC, TRIG, CHOLHDL, LDLDIRECT in the last 72 hours. Thyroid Function Tests: No results for input(s): TSH, T4TOTAL, FREET4, T3FREE, THYROIDAB in the last 72 hours. Anemia Panel: No results for input(s): VITAMINB12, FOLATE, FERRITIN, TIBC, IRON, RETICCTPCT in the last 72 hours. Urine analysis:    Component Value Date/Time   BILIRUBINUR negative 08/30/2015 1033   KETONESUR negative 08/30/2015 1033   PROTEINUR negative 08/30/2015 1033   UROBILINOGEN 0.2 08/30/2015 1033   NITRITE Negative 08/30/2015 1033   LEUKOCYTESUR Negative 08/30/2015 1033    Radiological Exams on Admission: Mr Brain W And Wo Contrast  Result Date: 11/10/2018 CLINICAL DATA:  Papilledema with new headache that started 7 weeks ago. Blurry vision beginning last week. EXAM: MRI HEAD AND ORBITS WITHOUT AND WITH CONTRAST TECHNIQUE: Multiplanar, multiecho pulse sequences of the brain and surrounding structures were obtained without and with intravenous contrast. Multiplanar, multiecho pulse sequences of the orbits and surrounding structures were obtained including fat saturation techniques, before and after intravenous contrast administration. CONTRAST:  5 mL Gadavist COMPARISON:  None. FINDINGS: MRI HEAD FINDINGS BRAIN: There is no  acute infarct, acute hemorrhage or extra-axial collection. The midline structures are normal. There is no midline shift or mass effect. The white matter signal is normal for the patient's age. The cerebral and cerebellar volume are age-appropriate. There is no hydrocephalus. Susceptibility-sensitive sequences show no chronic microhemorrhage or superficial siderosis. VASCULAR: The major intracranial arterial and venous sinus flow voids are normal. SKULL AND UPPER CERVICAL SPINE: Calvarial bone marrow signal is normal. There is no skull base mass. The visualized upper cervical spine and soft tissues are normal. MRI ORBITS FINDINGS Orbits: --Globes: Normal. --Bony orbit: Normal. --Preseptal soft tissues: Normal. --Intra- and extraconal orbital fat: Normal. No inflammatory stranding. --Optic nerves: Normal. --Lacrimal glands and fossae: Normal. --Extraocular muscles: Normal. Visualized sinuses:  No fluid levels or advanced mucosal thickening. Soft tissues: Normal. Limited intracranial: Normal. IMPRESSION: Normal brain and orbits. Electronically Signed   By: Ulyses Jarred M.D.   On: 11/10/2018 23:04   Mr Cervical Spine W Wo Contrast  Result Date: 11/11/2018 CLINICAL DATA:  Papilledema and headache EXAM: MRI CERVICAL SPINE WITHOUT AND WITH CONTRAST TECHNIQUE: Multiplanar  and multiecho pulse sequences of the cervical spine, to include the craniocervical junction and cervicothoracic junction, were obtained without and with intravenous contrast. CONTRAST:  5 mL Gadavist COMPARISON:  None. FINDINGS: Alignment: Grade 1 anterolisthesis at C4-5. Vertebrae: No fracture, evidence of discitis, or bone lesion. Cord: Normal signal and morphology. Posterior Fossa, vertebral arteries, paraspinal tissues: Negative. Disc levels: C1-2: Normal. C2-3: No disc herniation or stenosis. C3-4: Left facet hypertrophy. No spinal canal or neural foraminal stenosis. No disc herniation. C4-5: No disc herniation or stenosis.  Mild right facet  hypertrophy. C5-6: Left-greater-than-right facet hypertrophy. No spinal canal or neural foraminal stenosis. C6-7: Mild uncovertebral hypertrophy, left-greater-than-right with mild bilateral foraminal narrowing. C7-T1: No disc herniation or stenosis. T1-2: Normal. T2-3: Normal. IMPRESSION: 1. No cervical spinal cord abnormality. 2. Mild multilevel facet arthrosis with mild bilateral C7 neural foraminal narrowing. 3. No cervical spinal canal stenosis. Electronically Signed   By: Ulyses Jarred M.D.   On: 11/11/2018 00:07   Mr Rosealee Albee NW Contrast  Result Date: 11/10/2018 CLINICAL DATA:  Papilledema with new headache that started 7 weeks ago. Blurry vision beginning last week. EXAM: MRI HEAD AND ORBITS WITHOUT AND WITH CONTRAST TECHNIQUE: Multiplanar, multiecho pulse sequences of the brain and surrounding structures were obtained without and with intravenous contrast. Multiplanar, multiecho pulse sequences of the orbits and surrounding structures were obtained including fat saturation techniques, before and after intravenous contrast administration. CONTRAST:  5 mL Gadavist COMPARISON:  None. FINDINGS: MRI HEAD FINDINGS BRAIN: There is no acute infarct, acute hemorrhage or extra-axial collection. The midline structures are normal. There is no midline shift or mass effect. The white matter signal is normal for the patient's age. The cerebral and cerebellar volume are age-appropriate. There is no hydrocephalus. Susceptibility-sensitive sequences show no chronic microhemorrhage or superficial siderosis. VASCULAR: The major intracranial arterial and venous sinus flow voids are normal. SKULL AND UPPER CERVICAL SPINE: Calvarial bone marrow signal is normal. There is no skull base mass. The visualized upper cervical spine and soft tissues are normal. MRI ORBITS FINDINGS Orbits: --Globes: Normal. --Bony orbit: Normal. --Preseptal soft tissues: Normal. --Intra- and extraconal orbital fat: Normal. No inflammatory stranding.  --Optic nerves: Normal. --Lacrimal glands and fossae: Normal. --Extraocular muscles: Normal. Visualized sinuses:  No fluid levels or advanced mucosal thickening. Soft tissues: Normal. Limited intracranial: Normal. IMPRESSION: Normal brain and orbits. Electronically Signed   By: Ulyses Jarred M.D.   On: 11/10/2018 23:04    EKG: Independently reviewed.  Assessment/Plan Active Problems:   Temporal arteritis ([place])    1. Vision changes - suspected GCA 1. Per Dr. Leonel Ramsay: 1. GCA is best fit for presentation, only slightly unusual in that patient is on the young side for this. 2. Solumedrol 1g daily for 3 days 2. LP results still pending 3. But opening pressure was normal (IIH ruled out) 4. DDx still includes optic neuritis... though this would be unusual to be bilateral.  Of note: first line treatment for optic neuritis would also be high dose steroids. 5. Call Vasc surgery in AM for temporal artery biopsy 1. Will keep patient NPO  DVT prophylaxis: SCDs - for possible temporal artery biopsy Code Status: Full Family Communication: No family in room Disposition Plan: Home after admit Consults called: Neuro Admission status: Admit to inpatient  Severity of Illness: The appropriate patient status for this patient is INPATIENT. Inpatient status is judged to be reasonable and necessary in order to provide the required intensity of service to ensure the patient's safety. The patient's presenting  symptoms, physical exam findings, and initial radiographic and laboratory data in the context of their chronic comorbidities is felt to place them at high risk for further clinical deterioration. Furthermore, it is not anticipated that the patient will be medically stable for discharge from the hospital within 2 midnights of admission. The following factors support the patient status of inpatient.   IP status for treatment of suspected GCA with vision threatening changes / findings.   * I certify that  at the point of admission it is my clinical judgment that the patient will require inpatient hospital care spanning beyond 2 midnights from the point of admission due to high intensity of service, high risk for further deterioration and high frequency of surveillance required.*    Terrill Wauters M. DO [facility]  How to contact the [facility] Attending or Consulting provider [place] or covering provider during after hours [place], for this patient?  1. Check the care team in [facility] and look for a) attending/consulting [facility] provider listed and b) the [facility] team listed 2. Log into [email or web address]  Amion Physician Scheduling and messaging for groups and whole hospitals  On call and physician scheduling software for group practices, residents, hospitalists and other medical providers for call, clinic, rotation and shift schedules. OnCall [employer] is a hospital-wide system for scheduling doctors and paging doctors on call. EasyPlot is for scientific plotting and data analysis.  [email or web address]  and use [place]'s universal password to access. If you do not have the password, please contact the hospital operator.  3. Locate the [facility] provider you are looking for under [facility] and page to a number that you can be directly reached. 4. If you still have difficulty reaching the provider, please page the [facility] (Director on Call) for the Hospitalists listed on amion for assistance.  11/11/2018, 2:52 AM

## 2018-11-11 NOTE — Consult Note (Signed)
Neurology Consultation Reason for Consult: Papilledema Referring Physician: Carin Hock  CC: Visual change  History is obtained from: Patient  HPI: [name] is a 53 y.o. female with a history of multiple weeks of general malaise, intermittent fevers, bitemporal headaches who noticed on Wednesday that she was having abnormal vision out of her right eye.  She states with both eyes open and appears blurry, but if she closes her right eye she sees normally.  She closes her left eye she notices on the nasal aspect an area of decreased vision, essentially describing scotoma.  Due to the visual changes she saw an ophthalmologist today who noted papilledema and referred her for neurological evaluation.  She denies jaw claudication, does have some neck pain.  She does occasionally get migraines, but nothing like the headaches that she has been having for the past few weeks.  ROS: A 14 point ROS was performed and is negative except as noted in the HPI.   Past Medical History:  Diagnosis Date  . Arthritis      Family History  Problem Relation Age of Onset  . COPD Mother   . Emphysema Mother   . Cancer Father      Social History:  reports that she has never smoked. She has never used smokeless tobacco. She reports current alcohol use of about 2.0 standard drinks of alcohol per week. She reports that she does not use drugs.   Exam: Current vital signs: BP (!) 157/93   Pulse (!) 106   Temp 100.1 F (37.8 C) (Oral)   Resp 18   Ht 5' (1.524 m)   LMP 05/01/2012   SpO2 100%   BMI 29.29 kg/m  Vital signs in last 24 hours: Temp:  [100.1 F (37.8 C)] 100.1 F (37.8 C) (07/21 1642) Pulse Rate:  [95-122] 106 (07/21 2330) Resp:  [16-21] 18 (07/21 2226) BP: (119-157)/(69-93) 157/93 (07/22 0000) SpO2:  [98 %-100 %] 100 % (07/21 2330)   Physical Exam  Constitutional: Appears well-developed and well-nourished.  Psych: Affect appropriate to situation Eyes: No scleral injection HENT: No  OP obstrucion Head: Normocephalic.  Cardiovascular: Normal rate and regular rhythm.  Respiratory: Effort normal, non-labored breathing GI: Soft.  No distension. There is no tenderness.  Skin: WDI  Neuro: Mental Status: Patient is awake, alert, oriented to person, place, month, year, and situation. Patient is able to give a clear and coherent history. No signs of aphasia or neglect Cranial Nerves: II: Visual Fields are in the left eye,. Pupils are equal, round, and reactive to light.  There is papilledema in the right eye, the fields are indistinct on the left, but appears better than the right III,IV, VI: EOMI without ptosis or diploplia.  V: Facial sensation is symmetric to temperature VII: Facial movement is symmetric.  VIII: hearing is intact to voice X: Uvula elevates symmetrically XI: Shoulder shrug is symmetric. XII: tongue is midline without atrophy or fasciculations.  Motor: Tone is normal. Bulk is normal. 5/5 strength was present in all four extremities.  Sensory: Sensation is symmetric to light touch and temperature in the arms and legs. Deep Tendon Reflexes: 3+ and symmetric in the biceps and patellae.  Cerebellar: FNF  intact bilaterally   I have reviewed labs in epic and the results pertinent to this consultation are: Lactate-negative CBC- normal, no leukocytosis BMP-unremarkable I have reviewed the images obtained: MRI brain, cervical spine-unremarkable   Impression: 53 yo F with several weeks of headaches, one week of visual change  with bilateral papilledema. Possbile etiologies include temporal arteritis, optic neuritis(though unusual to be bilateral), IIH.   Recommendations: 1) LP for opening pressure, cells, glucose, protein 2) If ESR or CRP elevated, would consider high dose steroids and temporal artery biopsy 3) If ESR, CRP normal and no elevated ICP, consider steroids for presumed optic neuritis.    Roland Rack, MD [facility] [phone]  If 7pm- 7am, please page neurology on call as listed in [place].

## 2018-11-11 NOTE — Transfer of Care (Signed)
Immediate Anesthesia Transfer of Care Note  Patient: [name]  Procedure(s) Performed: BIOPSY TEMPORAL ARTERY (Right Head)  Patient Location: PACU  Anesthesia Type:General  Level of Consciousness: awake and alert   Airway & Oxygen Therapy: Patient Spontanous Breathing and Patient connected to face mask oxygen  Post-op Assessment: Report given to RN and Post -op Vital signs reviewed and stable  Post vital signs: Reviewed and stable  Last Vitals:  Vitals Value Taken Time  BP 117/56 11/11/18 1441  Temp    Pulse 108 11/11/18 1443  Resp 18 11/11/18 1443  SpO2 96 % 11/11/18 1443  Vitals shown include unvalidated device data.  Last Pain:  Vitals:   11/11/18 1045  TempSrc:   PainSc: 0-No pain         Complications: No apparent anesthesia complications

## 2018-11-11 NOTE — ED Notes (Signed)
ED TO INPATIENT HANDOFF REPORT  ED Nurse Name and Phone #: Tray Martinique, [phone]  S Name/Age/Gender [name] 53 y.o. female Room/Bed: 027C/027C  Code Status   Code Status: Full Code  Home/SNF/Other Home Patient oriented to: self, place, time and situation Is this baseline? Yes   Triage Complete: Triage complete  Chief Complaint R eye vision loss  Triage Note Pt states she has had blurry vision since Wednesday. Pt went to eye MD today was sent here to have an MRI. Pt's MD concerned she may have transverse myelitis. Pt recently treated for shingles with no rash on July 1.   Allergies No Known Allergies  Level of Care/Admitting Diagnosis ED Disposition    ED Disposition Condition Comment   Admit  Hospital Area: [facility] [[id number]]  Level of Care: Med-Surg [16]  Covid Evaluation: Confirmed COVID Negative  Diagnosis: Temporal arteritis [facility]) [[id number]]  Admitting Physician: Doreatha Massed  Attending Physician: Etta Quill [phone]  Estimated length of stay: past midnight tomorrow  Certification:: I certify this patient will need inpatient services for at least 2 midnights  PT Class (Do Not Modify): Inpatient [101]  PT Acc Code (Do Not Modify): Private [1]       B Medical/Surgery History Past Medical History:  Diagnosis Date  . Arthritis    Past Surgical History:  Procedure Laterality Date  . CESAREAN SECTION       A IV Location/Drains/Wounds Patient Lines/Drains/Airways Status   Active Line/Drains/Airways    Name:   Placement date:   Placement time:   Site:   Days:   Peripheral IV 11/10/18 Right Hand   11/10/18    -    Hand   1          Intake/Output Last 24 hours No intake or output data in the 24 hours ending 11/11/18 0323  Labs/Imaging Results for orders placed or performed during the hospital encounter of 11/10/18 (from the past 48 hour(s))  Basic metabolic panel     Status: Abnormal   Collection Time: 11/10/18   7:10 PM  Result Value Ref Range   Sodium 139 135 - 145 mmol/L   Potassium 3.8 3.5 - 5.1 mmol/L   Chloride 105 98 - 111 mmol/L   CO2 25 22 - 32 mmol/L   Glucose, Bld 114 (H) 70 - 99 mg/dL   BUN 13 6 - 20 mg/dL   Creatinine, Ser 0.84 0.44 - 1.00 mg/dL   Calcium 8.6 (L) 8.9 - 10.3 mg/dL   GFR calc non Af Amer >60 >60 mL/min   GFR calc Af Amer >60 >60 mL/min   Anion gap 9 5 - 15    Comment: Performed at [facility], [place] [address] [place], [place] [address]  CBC with Differential     Status: None   Collection Time: 11/10/18  7:10 PM  Result Value Ref Range   WBC 8.2 4.0 - 10.5 K/uL   RBC 4.30 3.87 - 5.11 MIL/uL   Hemoglobin 13.7 12.0 - 15.0 g/dL   HCT 42.3 36.0 - 46.0 %   MCV 98.4 80.0 - 100.0 fL   MCH 31.9 26.0 - 34.0 pg   MCHC 32.4 30.0 - 36.0 g/dL   RDW 12.8 11.5 - 15.5 %   Platelets 257 150 - 400 K/uL   nRBC 0.0 0.0 - 0.2 %   Neutrophils Relative % 67 %   Neutro Abs 5.5 1.7 - 7.7 K/uL   Lymphocytes Relative  24 %   Lymphs Abs 2.0 0.7 - 4.0 K/uL   Monocytes Relative 7 %   Monocytes Absolute 0.6 0.1 - 1.0 K/uL   Eosinophils Relative 1 %   Eosinophils Absolute 0.1 0.0 - 0.5 K/uL   Basophils Relative 0 %   Basophils Absolute 0.0 0.0 - 0.1 K/uL   Immature Granulocytes 1 %   Abs Immature Granulocytes 0.05 0.00 - 0.07 K/uL    Comment: Performed at [facility], [place] [address], [place], [place] [address]  Lactic acid, plasma     Status: None   Collection Time: 11/10/18  7:10 PM  Result Value Ref Range   Lactic Acid, Venous 1.4 0.5 - 1.9 mmol/L    Comment: Performed at [facility] [address], [place], [place] [address]  Magnesium     Status: None   Collection Time: 11/10/18  7:10 PM  Result Value Ref Range   Magnesium 2.4 1.7 - 2.4 mg/dL    Comment: Performed at [facility], [place] [address] [place],  [address]  SARS Coronavirus 2 (CEPHEID - Performed in [facility]), Hosp Order     Status: None   Collection Time: 11/10/18   8:24 PM   Specimen: Nasopharyngeal Swab  Result Value Ref Range   SARS Coronavirus 2 NEGATIVE NEGATIVE    Comment: (NOTE) If result is NEGATIVE SARS-CoV-2 target nucleic acids are NOT DETECTED. The SARS-CoV-2 RNA is generally detectable in upper and lower  respiratory specimens during the acute phase of infection. The lowest  concentration of SARS-CoV-2 viral copies this assay can detect is 250  copies / mL. A negative result does not preclude SARS-CoV-2 infection  and should not be used as the sole basis for treatment or other  patient management decisions.  A negative result may occur with  improper specimen collection / handling, submission of specimen other  than nasopharyngeal swab, presence of viral mutation(s) within the  areas targeted by this assay, and inadequate number of viral copies  (<250 copies / mL). A negative result must be combined with clinical  observations, patient history, and epidemiological information. If result is POSITIVE SARS-CoV-2 target nucleic acids are DETECTED. The SARS-CoV-2 RNA is generally detectable in upper and lower  respiratory specimens dur ing the acute phase of infection.  Positive  results are indicative of active infection with SARS-CoV-2.  Clinical  correlation with patient history and other diagnostic information is  necessary to determine patient infection status.  Positive results do  not rule out bacterial infection or co-infection with other viruses. If result is PRESUMPTIVE POSTIVE SARS-CoV-2 nucleic acids MAY BE PRESENT.   A presumptive positive result was obtained on the submitted specimen  and confirmed on repeat testing.  While 2019 novel coronavirus  (SARS-CoV-2) nucleic acids may be present in the submitted sample  additional confirmatory testing may be necessary for epidemiological  and / or clinical management purposes  to differentiate between  SARS-CoV-2 and other Sarbecovirus currently known to infect humans.  If  clinically indicated additional testing with an alternate test  methodology [phone]) is advised. The SARS-CoV-2 RNA is generally  detectable in upper and lower respiratory sp ecimens during the acute  phase of infection. The expected result is Negative. Fact Sheet for Patients:  StrictlyIdeas.no Fact Sheet for Healthcare Providers: BankingDealers.co.za This test is not yet approved or cleared by the [place] FDA and has been authorized for detection and/or diagnosis of SARS-CoV-2 by FDA under an  Emergency Use Authorization (EUA).  This EUA will remain in effect (meaning this test can be used) for the duration of the COVID-19 declaration under Section 564(b)(1) of the Act, 21 U.S.C. section 360bbb-3(b)(1), unless the authorization is terminated or revoked sooner. Performed at [facility], [place] [address] [place], [place] [address]   Lactic acid, plasma     Status: None   Collection Time: 11/11/18 12:32 AM  Result Value Ref Range   Lactic Acid, Venous 1.4 0.5 - 1.9 mmol/L    Comment: Performed at [facility] [address], [place], [place] [address]  Sedimentation rate     Status: Abnormal   Collection Time: 11/11/18 12:32 AM  Result Value Ref Range   Sed Rate 51 (H) 0 - 22 mm/hr    Comment: Performed at [facility] [address], [place], [place] [address]  C-reactive protein     Status: Abnormal   Collection Time: 11/11/18 12:32 AM  Result Value Ref Range   CRP 1.9 (H) <1.0 mg/dL    Comment: Performed at [facility], [place] [address] [place], [place] [address]  Protein and glucose, CSF     Status: None   Collection Time: 11/11/18  1:53 AM  Result Value Ref Range   Glucose, CSF 59 40 - 70 mg/dL   Total  Protein, CSF 40 15 - 45 mg/dL    Comment: Performed at [facility] [address] [place], [place] [address]   Mr Jeri Cos And Wo Contrast  Result Date: 11/10/2018 CLINICAL DATA:   Papilledema with new headache that started 7 weeks ago. Blurry vision beginning last week. EXAM: MRI HEAD AND ORBITS WITHOUT AND WITH CONTRAST TECHNIQUE: Multiplanar, multiecho pulse sequences of the brain and surrounding structures were obtained without and with intravenous contrast. Multiplanar, multiecho pulse sequences of the orbits and surrounding structures were obtained including fat saturation techniques, before and after intravenous contrast administration. CONTRAST:  5 mL Gadavist COMPARISON:  None. FINDINGS: MRI HEAD FINDINGS BRAIN: There is no acute infarct, acute hemorrhage or extra-axial collection. The midline structures are normal. There is no midline shift or mass effect. The white matter signal is normal for the patient's age. The cerebral and cerebellar volume are age-appropriate. There is no hydrocephalus. Susceptibility-sensitive sequences show no chronic microhemorrhage or superficial siderosis. VASCULAR: The major intracranial arterial and venous sinus flow voids are normal. SKULL AND UPPER CERVICAL SPINE: Calvarial bone marrow signal is normal. There is no skull base mass. The visualized upper cervical spine and soft tissues are normal. MRI ORBITS FINDINGS Orbits: --Globes: Normal. --Bony orbit: Normal. --Preseptal soft tissues: Normal. --Intra- and extraconal orbital fat: Normal. No inflammatory stranding. --Optic nerves: Normal. --Lacrimal glands and fossae: Normal. --Extraocular muscles: Normal. Visualized sinuses:  No fluid levels or advanced mucosal thickening. Soft tissues: Normal. Limited intracranial: Normal. IMPRESSION: Normal brain and orbits. Electronically Signed   By: Ulyses Jarred M.D.   On: 11/10/2018 23:04   Mr Cervical Spine W Wo Contrast  Result Date: 11/11/2018 CLINICAL DATA:  Papilledema and headache EXAM: MRI CERVICAL SPINE WITHOUT AND WITH CONTRAST TECHNIQUE: Multiplanar and multiecho pulse sequences of the cervical spine, to include the craniocervical junction and  cervicothoracic junction, were obtained without and with intravenous contrast. CONTRAST:  5 mL Gadavist COMPARISON:  None. FINDINGS: Alignment: Grade 1 anterolisthesis at C4-5. Vertebrae: No fracture, evidence of discitis, or bone lesion. Cord: Normal signal and morphology. Posterior Fossa, vertebral arteries, paraspinal tissues: Negative. Disc  levels: C1-2: Normal. C2-3: No disc herniation or stenosis. C3-4: Left facet hypertrophy. No spinal canal or neural foraminal stenosis. No disc herniation. C4-5: No disc herniation or stenosis.  Mild right facet hypertrophy. C5-6: Left-greater-than-right facet hypertrophy. No spinal canal or neural foraminal stenosis. C6-7: Mild uncovertebral hypertrophy, left-greater-than-right with mild bilateral foraminal narrowing. C7-T1: No disc herniation or stenosis. T1-2: Normal. T2-3: Normal. IMPRESSION: 1. No cervical spinal cord abnormality. 2. Mild multilevel facet arthrosis with mild bilateral C7 neural foraminal narrowing. 3. No cervical spinal canal stenosis. Electronically Signed   By: Ulyses Jarred M.D.   On: 11/11/2018 00:07   Mr Rosealee Albee OI Contrast  Result Date: 11/10/2018 CLINICAL DATA:  Papilledema with new headache that started 7 weeks ago. Blurry vision beginning last week. EXAM: MRI HEAD AND ORBITS WITHOUT AND WITH CONTRAST TECHNIQUE: Multiplanar, multiecho pulse sequences of the brain and surrounding structures were obtained without and with intravenous contrast. Multiplanar, multiecho pulse sequences of the orbits and surrounding structures were obtained including fat saturation techniques, before and after intravenous contrast administration. CONTRAST:  5 mL Gadavist COMPARISON:  None. FINDINGS: MRI HEAD FINDINGS BRAIN: There is no acute infarct, acute hemorrhage or extra-axial collection. The midline structures are normal. There is no midline shift or mass effect. The white matter signal is normal for the patient's age. The cerebral and cerebellar volume are  age-appropriate. There is no hydrocephalus. Susceptibility-sensitive sequences show no chronic microhemorrhage or superficial siderosis. VASCULAR: The major intracranial arterial and venous sinus flow voids are normal. SKULL AND UPPER CERVICAL SPINE: Calvarial bone marrow signal is normal. There is no skull base mass. The visualized upper cervical spine and soft tissues are normal. MRI ORBITS FINDINGS Orbits: --Globes: Normal. --Bony orbit: Normal. --Preseptal soft tissues: Normal. --Intra- and extraconal orbital fat: Normal. No inflammatory stranding. --Optic nerves: Normal. --Lacrimal glands and fossae: Normal. --Extraocular muscles: Normal. Visualized sinuses:  No fluid levels or advanced mucosal thickening. Soft tissues: Normal. Limited intracranial: Normal. IMPRESSION: Normal brain and orbits. Electronically Signed   By: Ulyses Jarred M.D.   On: 11/10/2018 23:04    Pending Labs Unresulted Labs (From admission, onward)    Start     Ordered   11/11/18 0500  CBC  Tomorrow morning,   R     11/11/18 0241   11/11/18 3704  Basic metabolic panel  Tomorrow morning,   R     11/11/18 0241   11/11/18 0238  HIV antibody (Routine Testing)  Once,   STAT     11/11/18 0241   11/11/18 0116  CSF cell count with differential  Once,   STAT    Question:  Are there also cytology or pathology orders on this specimen?  Answer:  No   11/11/18 0115   11/10/18 1824  Culture, blood (routine x 2)  BLOOD CULTURE X 2,   STAT     11/10/18 1823          Vitals/Pain Today's Vitals   11/10/18 2330 11/10/18 2345 11/11/18 0000 11/11/18 0100  BP: (!) 157/79 (!) 148/79 (!) 157/93 (!) 141/105  Pulse: (!) 106   (!) 107  Resp:      Temp:      TempSrc:      SpO2: 100%   99%  Height:      PainSc:        Isolation Precautions No active isolations  Medications Medications  methylPREDNISolone sodium succinate (SOLU-MEDROL) 1,000 mg in sodium chloride 0.9 % 50 mL IVPB (has no administration in  time range)   acetaminophen (TYLENOL) tablet 650 mg (has no administration in time range)    Or  acetaminophen (TYLENOL) suppository 650 mg (has no administration in time range)  ondansetron (ZOFRAN) tablet 4 mg (has no administration in time range)    Or  ondansetron (ZOFRAN) injection 4 mg (has no administration in time range)  pantoprazole (PROTONIX) EC tablet 40 mg (has no administration in time range)  gadobutrol (GADAVIST) 1 MMOL/ML injection 5 mL (5 mLs Intravenous Contrast Given 11/10/18 2220)    Mobility walks     Focused Assessments Neuro Assessment Handoff:  Swallow screen pass? Yes          Neuro Assessment: Exceptions to WDL Neuro Checks:      Last Documented NIHSS Modified Score:   Has TPA been given? No If patient is a Neuro Trauma and patient is going to OR before floor call report to [place] nurse: [phone] or [phone]     R Recommendations: See Admitting Provider Note  Report given to:   Additional Notes:

## 2018-11-11 NOTE — ED Notes (Signed)
Consent signed by pt for Lumbar Puncture and procedure explained by Dr. Leonel Ramsay.
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
## 2018-11-11 NOTE — Procedures (Signed)
Indication: Papilledema  Risks of the procedure were dicussed with the patient including post-LP headache, bleeding, infection, weakness/numbness of legs(radiculopathy), death.  The patient/patient's proxy agreed and written consent was obtained.   The patient was prepped and draped, and using sterile technique a 20 gauge quinke spinal needle was inserted in the L4-5 space. The opening pressure was 15cmH2O. Approximately 5 cc of CSF were obtained and sent for analysis.

## 2018-11-11 NOTE — Op Note (Signed)
    OPERATIVE REPORT  DATE OF SURGERY: 11/11/2018  PATIENT: [name], 53 y.o. female MRN: [id number]  DOB: December 08, 1965  PRE-OPERATIVE DIAGNOSIS: Headache and visual changes, possible temporal arteritis  POST-OPERATIVE DIAGNOSIS:  Same  PROCEDURE: Right temporal artery biopsy  SURGEON:  [name], M.D.  PHYSICIAN ASSISTANT: Nurse  ANESTHESIA: General  EBL: per anesthesia record  Total I/O In: 800 [I.V.:800] Out: 20 [Blood:20]  BLOOD ADMINISTERED: none  DRAINS: none  SPECIMEN: none  COUNTS CORRECT:  YES  PATIENT DISPOSITION:  PACU - hemodynamically stable  PROCEDURE DETAILS: Patient was taken to the operating placed supine position where the area of the right pre-auricular area prepped and draped in usual sterile fashion.  An incision was made anterior to the ear over the temporal artery pulse.  The incision was continued deep to the temporal artery.  The patient had a large vein alongside the temporal artery this was preserved.  The branches were ligated with 3-0 silk ties and divided.  The artery was ligated proximally and distally.  The vein segment of the temporal artery approximately 2-1/2 to 3 cm was removed and was sent to pathology for permanent section.  The wound was irrigated with saline.  Hemostasis was obtained with electrocautery.  Wound was closed with several interrupted 3-0 Vicryl sutures to reapproximate the subcutaneous tissue.  The skin was closed with 4 subcuticular Vicryl stitch.  Dermabond was applied and the patient was transferred to the recovery room in stable condition   [name], M.D., [facility] 11/11/2018 2:40 PM
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
## 2018-11-11 NOTE — Anesthesia Procedure Notes (Signed)
Procedure Name: Intubation Date/Time: 11/11/2018 1:30 PM Performed by: Jearld Pies, CRNA Pre-anesthesia Checklist: Patient identified, Emergency Drugs available, Suction available and Patient being monitored Patient Re-evaluated:Patient Re-evaluated prior to induction Oxygen Delivery Method: Circle System Utilized Preoxygenation: Pre-oxygenation with 100% oxygen Induction Type: IV induction and Cricoid Pressure applied Ventilation: Mask ventilation without difficulty and Oral airway inserted - appropriate to patient size Laryngoscope Size: Miller and 2 Tube type: Oral Tube size: 7.0 mm Number of attempts: 1 Airway Equipment and Method: Stylet and Oral airway Placement Confirmation: ETT inserted through vocal cords under direct vision,  positive ETCO2 and breath sounds checked- equal and bilateral Secured at: 22 cm Tube secured with: Tape Dental Injury: Teeth and Oropharynx as per pre-operative assessment

## 2018-11-11 NOTE — ED Provider Notes (Signed)
Care assumed from Dr. Melina Copa, patient with visual loss, papilledema seen by ophthalmologist, negative MRI of brain.  CRP and sedimentation rate are elevated, for neurology consultation probable temporal arteritis.  She is started on methylprednisolone.  Case is discussed with Dr. Alcario Drought of [facility], who agrees to admit the patient.   Delora Fuel, MD 06/12/77 [phone]

## 2018-11-12 ENCOUNTER — Encounter (HOSPITAL_COMMUNITY): Payer: Self-pay | Admitting: Vascular Surgery

## 2018-11-12 ENCOUNTER — Inpatient Hospital Stay (HOSPITAL_COMMUNITY): Payer: BC Managed Care – PPO

## 2018-11-12 DIAGNOSIS — H4710 Unspecified papilledema: Secondary | ICD-10-CM

## 2018-11-12 LAB — B. BURGDORFI ANTIBODIES: B burgdorferi Ab IgG+IgM: <0.91 {ISR} (ref 0.00–0.90)

## 2018-11-12 LAB — FLUORESCENT TREPONEMAL AB(FTA)-IGG-BLD: Fluorescent Treponemal Ab, IgG: NONREACTIVE

## 2018-11-12 MED ORDER — VALACYCLOVIR HCL 500 MG PO TABS
500.0000 mg | ORAL_TABLET | Freq: Three times a day (TID) | ORAL | Status: DC
  Administered 2018-11-12 – 2018-11-14 (×6): 500 mg via ORAL
  Filled 2018-11-12 (×6): qty 1

## 2018-11-12 MED ORDER — PROPOFOL 1000 MG/100ML IV EMUL
INTRAVENOUS | Status: AC
  Filled 2018-11-12: qty 300

## 2018-11-12 MED ORDER — ENOXAPARIN SODIUM 40 MG/0.4ML ~~LOC~~ SOLN
40.0000 mg | SUBCUTANEOUS | Status: DC
  Administered 2018-11-12: 40 mg via SUBCUTANEOUS
  Filled 2018-11-12 (×2): qty 0.4

## 2018-11-12 NOTE — Plan of Care (Signed)
  Problem: Education: Goal: Knowledge of General Education information will improve Description: Including pain rating scale, medication(s)/side effects and non-pharmacologic comfort measures Outcome: Progressing   Problem: Health Behavior/Discharge Planning: Goal: Ability to manage health-related needs will improve Outcome: Progressing   Problem: Clinical Measurements: Goal: Diagnostic test results will improve Outcome: Progressing   Problem: Safety: Goal: Ability to remain free from injury will improve Outcome: Progressing

## 2018-11-12 NOTE — Consult Note (Signed)
[facility]     Reason for Consult: Papilledema    Referring Physician: Dr. Domenic Polite  Active Problems:   Temporal arteritis ([place])   . enoxaparin (LOVENOX) injection  40 mg Subcutaneous Q24H  . pantoprazole  40 mg Oral Daily  . scopolamine  1 patch Transdermal Once  . valACYclovir  500 mg Oral TID    Recommendations: * Will followup test for syphilis, bartonella, lyme disease.  * Will add CXR to evaluate for sarcoid and TB  Assessment: [name] is a 53 yo female with PMH of arthritis who presented to ED with papilledema noted by outpatient ophthamologist. Given absence of inflammation on MR and normal opening pressure, presentation is unlikely to be infectious. History sounds consistent with giant cell arteritis. Temporal artery biopsy was negative for inflammation. However, given history of prednisone usage perhaps the inflammation was suppressed such that it was not evident on biopsy. Even in the absence of prednisone usage, biopsy results can yield false negatives.  Antibiotics: * On Valtrex * Cefazolin x1 yesterday  HPI: [name] is a 53 y.o. female with PMH of arthritis. Patient presented to the ED with complaint of multiple weeks of generalized malaise, intermittent fevers, bitemporal headaches. Patient reports that 7 weeks ago she had headaches, intermittent fever, and right flank pain. She describes the headaches as a stabbing pain over her right temporal region. She went to a physician who diagnosed shingles based on history (no rash) and patient was prescribed valtrex and prednisone. Started to feel better but aches came back after finishing the medicines. Last week woke up and had the blurry vision and cloud over eye. Patient reports that she experienced right eye pain that hurt when she moved her eyes. Dark spot is at center of whatever you are looking at. Fever on and off over past 7 weeks with night sweats. On Wednesday, patient  noticed abnormal vision in right eye. Patient went to ophthalmologist today who noted papilledema and referred for neurological evaluation. MR brain and orbits was normal, MR cervical spine showed no cervical spinal cord abnormality, and CTA head showed normal circle-of-willis without significant proximal stenosis, aneurysm or branch vessel occlusion. Patient afebrile since admission with Tmax of 100.1 and white count of 7.8. Patient had elevated ESR and CRP and was started on high-dose methylprednisone. She underwent temporal artery biopsy on 7/23, results pending. LP unremarkable with normal protein, glucose, and cell counts, opening pressure 15.    Review of Systems: Review of Systems  Constitutional: Positive for chills and fever.  Respiratory: Negative for shortness of breath.   Cardiovascular: Negative for chest pain.  Gastrointestinal: Negative for nausea and vomiting.  Genitourinary: Negative for dysuria.    All other systems reviewed and are negative    Past Medical History:  Diagnosis Date  . Arthritis     Social History   Tobacco Use  . Smoking status: Never Smoker  . Smokeless tobacco: Never Used  Substance Use Topics  . Alcohol use: Yes    Alcohol/week: 2.0 standard drinks    Types: 2 Glasses of wine per week  . Drug use: No    Family History  Problem Relation Age of Onset  . COPD Mother   . Emphysema Mother   . Cancer Father     No Known Allergies  Physical Exam: Constitutional: In no acute distress Vitals:   11/12/18 0517 11/12/18 1424  BP: 111/73 107/60  Pulse: 72 88  Resp:  18  Temp: 98.2 F (36.8 C) 99.3 F (37.4 C)  SpO2: 97% 97%   EYES: anicteric Cardiovascular: RRR, no m/r/g Respiratory: CTAB no wheezes, rhonchi, rales GI: Soft, NT, ND Musculoskeletal: No peripheral edema Skin: No rashes, wounds Hematologic: No bruising or signs of bleeding  Lab Results  Component Value Date   WBC 7.8 11/11/2018   HGB 13.7 11/11/2018   HCT 41.2  11/11/2018   MCV 97.9 11/11/2018   PLT 250 11/11/2018    Lab Results  Component Value Date   CREATININE 0.81 11/11/2018   BUN 12 11/11/2018   NA 138 11/11/2018   K 4.2 11/11/2018   CL 104 11/11/2018   CO2 24 11/11/2018    Lab Results  Component Value Date   ALT 14 08/30/2015   AST 15 08/30/2015   ALKPHOS 88 08/30/2015     Microbiology: Recent Results (from the past 240 hour(s))  Culture, blood (routine x 2)     Status: None (Preliminary result)   Collection Time: 11/10/18  6:50 PM   Specimen: BLOOD  Result Value Ref Range Status   Specimen Description BLOOD BLOOD RIGHT FOREARM  Final   Special Requests   Final    BOTTLES DRAWN AEROBIC AND ANAEROBIC Blood Culture results may not be optimal due to an inadequate volume of blood received in culture bottles   Culture   Final    NO GROWTH 2 DAYS Performed at [facility], [place] [address] [place], [place] [address]    Report Status PENDING  Incomplete  Culture, blood (routine x 2)     Status: None (Preliminary result)   Collection Time: 11/10/18  7:10 PM   Specimen: BLOOD  Result Value Ref Range Status   Specimen Description BLOOD LEFT ANTECUBITAL  Final   Special Requests   Final    BOTTLES DRAWN AEROBIC AND ANAEROBIC Blood Culture results may not be optimal due to an inadequate volume of blood received in culture bottles   Culture   Final    NO GROWTH 2 DAYS Performed at [facility], [place] [address], [place], [place] [address]    Report Status PENDING  Incomplete  SARS Coronavirus 2 (CEPHEID - Performed in [place] hospital lab), Hosp Order     Status: None   Collection Time: 11/10/18  8:24 PM   Specimen: Nasopharyngeal Swab  Result Value Ref Range Status   SARS Coronavirus 2 NEGATIVE NEGATIVE Final    Comment: (NOTE) If result is NEGATIVE SARS-CoV-2 target nucleic acids are NOT DETECTED. The SARS-CoV-2 RNA is generally detectable in upper and lower  respiratory specimens during the acute phase of  infection. The lowest  concentration of SARS-CoV-2 viral copies this assay can detect is 250  copies / mL. A negative result does not preclude SARS-CoV-2 infection  and should not be used as the sole basis for treatment or other  patient management decisions.  A negative result may occur with  improper specimen collection / handling, submission of specimen other  than nasopharyngeal swab, presence of viral mutation(s) within the  areas targeted by this assay, and inadequate number of viral copies  (<250 copies / mL). A negative result must be combined with clinical  observations, patient history, and epidemiological information. If result is POSITIVE SARS-CoV-2 target nucleic acids are DETECTED. The SARS-CoV-2 RNA is generally detectable in upper and lower  respiratory specimens dur ing the acute phase of infection.  Positive  results are indicative of active infection with SARS-CoV-2.  Clinical  correlation with patient history and other diagnostic information is  necessary to determine patient infection status.  Positive results do  not rule out bacterial infection or co-infection with other viruses. If result is PRESUMPTIVE POSTIVE SARS-CoV-2 nucleic acids MAY BE PRESENT.   A presumptive positive result was obtained on the submitted specimen  and confirmed on repeat testing.  While 2019 novel coronavirus  (SARS-CoV-2) nucleic acids may be present in the submitted sample  additional confirmatory testing may be necessary for epidemiological  and / or clinical management purposes  to differentiate between  SARS-CoV-2 and other Sarbecovirus currently known to infect humans.  If clinically indicated additional testing with an alternate test  methodology [phone]) is advised. The SARS-CoV-2 RNA is generally  detectable in upper and lower respiratory sp ecimens during the acute  phase of infection. The expected result is Negative. Fact Sheet for Patients:   StrictlyIdeas.no Fact Sheet for Healthcare Providers: BankingDealers.co.za This test is not yet approved or cleared by the [place] FDA and has been authorized for detection and/or diagnosis of SARS-CoV-2 by FDA under an Emergency Use Authorization (EUA).  This EUA will remain in effect (meaning this test can be used) for the duration of the COVID-19 declaration under Section 564(b)(1) of the Act, 21 U.S.C. section 360bbb-3(b)(1), unless the authorization is terminated or revoked sooner. Performed at [facility], [place] [address] [place], [place] [address]     Jeanmarie Hubert, MD, PGY1 [facility] [place] [email or web address] 11/12/2018, 3:31 PM

## 2018-11-12 NOTE — Anesthesia Postprocedure Evaluation (Signed)
Anesthesia Post Note  Patient: [name]  Procedure(s) Performed: BIOPSY TEMPORAL ARTERY (Right Head)     Patient location during evaluation: PACU Anesthesia Type: General Level of consciousness: awake and alert Pain management: pain level controlled Vital Signs Assessment: post-procedure vital signs reviewed and stable Respiratory status: spontaneous breathing, nonlabored ventilation, respiratory function stable and patient connected to nasal cannula oxygen Cardiovascular status: blood pressure returned to baseline and stable Postop Assessment: no apparent nausea or vomiting Anesthetic complications: no    Last Vitals:  Vitals:   11/12/18 0517 11/12/18 1424  BP: 111/73 107/60  Pulse: 72 88  Resp:  18  Temp: 36.8 C 37.4 C  SpO2: 97% 97%    Last Pain:  Vitals:   11/12/18 1424  TempSrc: Oral  PainSc:                  Keldrick Pomplun S

## 2018-11-12 NOTE — Progress Notes (Addendum)
NEUROLOGY PROGRESS NOTE  Subjective: Patient states that her left eye has no problems however her right eye still has a dark spot in the middle.  She is not complaining of any pain.  She has received a temporal  biopsy  Exam: Vitals:   11/11/18 2134 11/12/18 0517  BP: 118/70 111/73  Pulse: 93 72  Resp:    Temp: 98.7 F (37.1 C) 98.2 F (36.8 C)  SpO2: 94% 97%    Physical Exam   HEENT-  Normocephalic, no lesions, without obvious abnormality.  Normal external eye and conjunctiva.   Extremities- Warm, dry and intact Musculoskeletal-no joint tenderness, deformity or swelling Skin-warm and dry, no hyperpigmentation, vitiligo, or suspicious lesions    Neuro:  Mental Status: Alert, oriented, thought content appropriate.  Speech fluent without evidence of aphasia.  Able to follow 3 step commands without difficulty. Cranial Nerves: II: Minimal in the left eye, does have a dark spot in the right eye III,IV, VI: ptosis not present, extra-ocular motions intact bilaterally pupils equal, round, reactive to light and accommodation V,VII: smile symmetric, facial light touch sensation normal bilaterally VIII: hearing normal bilaterally IX,X: Palate rises midline XI: bilateral shoulder shrug XII: midline tongue extension Motor: Right : Upper extremity   5/5    Left:     Upper extremity   5/5  Lower extremity   5/5     Lower extremity   5/5 Tone and bulk:normal tone throughout; no atrophy noted Sensory: Pinprick and light touch intact throughout, bilaterally Deep Tendon Reflexes: 3+ and symmetric throughout Plantars: Right: downgoing   Left: downgoing     Medications:  Scheduled: . pantoprazole  40 mg Oral Daily  . scopolamine  1 patch Transdermal Once   Continuous: . sodium chloride 10 mL/hr at 11/11/18 1243  . methylPREDNISolone (SOLU-MEDROL) injection 58 mL/hr at 11/11/18 0500   VWP:[id number] **OR** acetaminophen, ondansetron **OR** ondansetron (ZOFRAN) IV,  oxyCODONE-acetaminophen  Pertinent Labs/Diagnostics: Results for JULAINE, ZIMNY (MRN [id number]) as of 11/12/2018 09:06  Ref. Range 11/11/2018 01:53  Appearance, CSF Latest Ref Range: CLEAR  COLORLESS (A)  Glucose, CSF Latest Ref Range: 40 - 70 mg/dL 59  RBC Count, CSF Latest Ref Range: 0 /cu mm 5 (H)  WBC, CSF Latest Ref Range: 0 - 5 /cu mm 6 (H)  Other Cells, CSF Unknown TOO FEW TO COUNT, SMEAR AVAILABLE FOR REVIEW  Color, CSF Latest Ref Range: COLORLESS  CLEAR (A)  Supernatant Unknown NOT INDICATED  Total  Protein, CSF Latest Ref Range: 15 - 45 mg/dL 40  Tube # Unknown 3   B burgdorferi antibodies pending Surgical pathology pending QuantiFERON-TB pending Fluorescent treponemal pending CRP 1.9 Sed rate 51    Ct Angio Head W Or Wo Contrast  Result Date: 11/11/2018 CLINICAL DATA: IMPRESSION: Normal CTA circle-of-Willis without significant proximal stenosis, aneurysm, or branch vessel occlusion. No focal vascular abnormality within the orbits. Electronically Signed   By: San Morelle M.D.   On: 11/11/2018 12:23   Mr Jeri Cos And Wo Contrast  Result Date: 11/10/2018 CLINICAL DATA:  Papilledema with new headache that started 7 weeks ago. Blurry vision beginning last week. EXAM: MRI HEAD AND ORBITS . IMPRESSION: Normal brain and orbits. Electronically Signed   By: Ulyses Jarred M.D.   On: 11/10/2018 23:04   Mr Cervical Spine W Wo Contrast  Result Date: 11/11/2018 CLINICAL DATA:  Papilledema and headache EXAM: MRI CERVICAL SPINE WITHOUT AND WITH CONTRAST TECHNIQUE:IMPRESSION: 1. No cervical spinal cord abnormality. 2. Mild multilevel facet arthrosis  with mild bilateral C7 neural foraminal narrowing. 3. No cervical spinal canal stenosis. Electronically Signed   By: Ulyses Jarred M.D.   On: 11/11/2018 00:07   Mr Rosealee Albee NA Contrast  Result Date: 11/10/2018 CLINICAL DATA:  Papilledema with new headache that started 7 weeks ago. Blurry vision beginning last week. EXAM:  IMPRESSION:  Normal brain and orbits. Electronically Signed   By: Ulyses Jarred M.D.   On: 11/10/2018 23:04     Etta Quill PA-C [facility] [phone]  NEUROHOSPITALIST ADDENDUM Performed a face to face diagnostic evaluation.   I have reviewed the contents of history and physical exam as documented by PA/ARNP/Resident and agree with above documentation.  I have discussed and formulated the above plan as documented. Edits to the note have been made as needed.    Assessment: 53 year old female with several weeks of headaches, visual changes (scotoma in the right eye) with bilateral papilledema.   MRI orbits does not show optic neuritis, MRI brain negative for any acute findings.  CTA of the head negative for stenosis, negative for irregularity to suggest vasculitis.   LP was obtained and unremarkable.Her ESR and CRP were elevated and patient was started on high-dose methylprednisone.   She underwent a temporal artery biopsy om 7/23 and is still awaiting results. Patient reports significantly improved headaches but continues to have scotoma in the right eye but no change.  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 Bartonella can cause neuroretinitis resulting in papilledema. Will discuss with ID regarding possibly emperic treatment.  Bartonella antibody test has been sent out.   Karena Addison  MD [facility] [phone]   If 7pm to 7am, please call on call as listed on AMION.    11/12/2018, 9:04 AM

## 2018-11-12 NOTE — Progress Notes (Addendum)
PROGRESS NOTE    [name]  RCB:[id number] DOB: 1965/04/27 DOA: 11/10/2018 PCP: Patient, No Pcp Per  Brief Narrative:[name] is a 53 y.o. female with medical history significant of arthritis.  Patient presents to the ED with c/o multiple weeks (at least a month+) of generalized malaise, intermittent fevers, bitemporal headaches.  On Wed patient noticed she was having abnormal vision out of her right eye.  She states with both eyes open and appears blurry, but if she closes her right eye she sees normally. She closes her left eye she notices on the nasal aspect an area of decreased vision, essentially describing scotoma.Due to the visual changes she saw an ophthalmologist on wednesday who noted papilledema and referred her to ED for neurological evaluation.   Assessment & Plan:   Headache, vision changes, papilledema -in background of malaise,intermittent fevers for 6month-LP was unremarkable normal blood protein glucose and cell counts ESR 52, opening pressure was 15, rules out IIH -s/p Temporal artery biopsy yesterday -MRI not c/w optic neuritis, CTA without medium to large vessel disease -diagnosis unclear at this time -Is post 2 doses of high-dose IV Solu-Medrol, 1 dose left, patient reports improvement in headache but vision changes persist -await temporal artery biopsy, Neurology following -d/w Ophthalmology Dr.Groat, he recommended to rule out syphilis, Bartonella,TB, Lyme disease and ID eval for unusual infections   DVT prophylaxis: Lovenox Code Status: Full code Family Communication: No family at bedside Disposition Plan: Home pending above work-up  Consultants:   Vascular surgery  Neurology   Procedures: Temporal artery biopsy  Antimicrobials:    Subjective: -Reports improvement in headache, vision changes persist  Objective: Vitals:   11/11/18 1510 11/11/18 1545 11/11/18 2134 11/12/18 0517  BP: (!) 103/53 120/62 118/70 111/73  Pulse: (!) 104 96 93  72  Resp: 16 16    Temp: 98.4 F (36.9 C) 98.6 F (37 C) 98.7 F (37.1 C) 98.2 F (36.8 C)  TempSrc:  Oral Oral Oral  SpO2: 94% 95% 94% 97%  Weight:      Height:        Intake/Output Summary (Last 24 hours) at 11/12/2018 1333 Last data filed at 11/12/2018 0856 Gross per 24 hour  Intake 1349.85 ml  Output 20 ml  Net 1329.85 ml   Filed Weights   11/11/18 0427 11/11/18 1235  Weight: 67.3 kg 67.3 kg    Examination:  General exam: Appears calm and comfortable  Respiratory system: Clear to auscultation. Respiratory effort normal. Cardiovascular system: S1 & S2 heard, RRR. No JVD, murmurs, rubs, gallops Gastrointestinal system: Abdomen is nondistended, soft and nontender.Normal bowel sounds heard. Central nervous system: Alert and oriented. No focal neurological deficits. Extremities: Symmetric 5 x 5 power. Skin: No rashes, lesions or ulcers Psychiatry: Judgement and insight appear normal. Mood & affect appropriate.     Data Reviewed:   CBC: Recent Labs  Lab 11/10/18 1910 11/11/18 0321  WBC 8.2 7.8  NEUTROABS 5.5  --   HGB 13.7 13.7  HCT 42.3 41.2  MCV 98.4 97.9  PLT 257 2803  Basic Metabolic Panel: Recent Labs  Lab 11/10/18 1910 11/11/18 0321  NA 139 138  K 3.8 4.2  CL 105 104  CO2 25 24  GLUCOSE 114* 129*  BUN 13 12  CREATININE 0.84 0.81  CALCIUM 8.6* 8.6*  MG 2.4  --    GFR: Estimated Creatinine Clearance: 67.9 mL/min (by C-G formula based on SCr of 0.81 mg/dL). Liver Function Tests: No results for input(s):  AST, ALT, ALKPHOS, BILITOT, PROT, ALBUMIN in the last 168 hours. No results for input(s): LIPASE, AMYLASE in the last 168 hours. No results for input(s): AMMONIA in the last 168 hours. Coagulation Profile: No results for input(s): INR, PROTIME in the last 168 hours. Cardiac Enzymes: No results for input(s): CKTOTAL, CKMB, CKMBINDEX, TROPONINI in the last 168 hours. BNP (last 3 results) No results for input(s): PROBNP in the last 8760  hours. HbA1C: No results for input(s): HGBA1C in the last 72 hours. CBG: No results for input(s): GLUCAP in the last 168 hours. Lipid Profile: No results for input(s): CHOL, HDL, LDLCALC, TRIG, CHOLHDL, LDLDIRECT in the last 72 hours. Thyroid Function Tests: No results for input(s): TSH, T4TOTAL, FREET4, T3FREE, THYROIDAB in the last 72 hours. Anemia Panel: No results for input(s): VITAMINB12, FOLATE, FERRITIN, TIBC, IRON, RETICCTPCT in the last 72 hours. Urine analysis:    Component Value Date/Time   BILIRUBINUR negative 08/30/2015 1033   KETONESUR negative 08/30/2015 1033   PROTEINUR negative 08/30/2015 1033   UROBILINOGEN 0.2 08/30/2015 1033   NITRITE Negative 08/30/2015 1033   LEUKOCYTESUR Negative 08/30/2015 1033   Sepsis Labs: _0 (procalcitonin:4,lacticidven:4)  ) Recent Results (from the past 240 hour(s))  Culture, blood (routine x 2)     Status: None (Preliminary result)   Collection Time: 11/10/18  6:50 PM   Specimen: BLOOD  Result Value Ref Range Status   Specimen Description BLOOD BLOOD RIGHT FOREARM  Final   Special Requests   Final    BOTTLES DRAWN AEROBIC AND ANAEROBIC Blood Culture results may not be optimal due to an inadequate volume of blood received in culture bottles   Culture   Final    NO GROWTH 2 DAYS Performed at [facility], [place] [address], [place], [place] [address]    Report Status PENDING  Incomplete  Culture, blood (routine x 2)     Status: None (Preliminary result)   Collection Time: 11/10/18  7:10 PM   Specimen: BLOOD  Result Value Ref Range Status   Specimen Description BLOOD LEFT ANTECUBITAL  Final   Special Requests   Final    BOTTLES DRAWN AEROBIC AND ANAEROBIC Blood Culture results may not be optimal due to an inadequate volume of blood received in culture bottles   Culture   Final    NO GROWTH 2 DAYS Performed at [facility], [place] [address] [place],  [address]    Report Status PENDING  Incomplete  SARS  Coronavirus 2 (CEPHEID - Performed in [place] hospital lab), Hosp Order     Status: None   Collection Time: 11/10/18  8:24 PM   Specimen: Nasopharyngeal Swab  Result Value Ref Range Status   SARS Coronavirus 2 NEGATIVE NEGATIVE Final    Comment: (NOTE) If result is NEGATIVE SARS-CoV-2 target nucleic acids are NOT DETECTED. The SARS-CoV-2 RNA is generally detectable in upper and lower  respiratory specimens during the acute phase of infection. The lowest  concentration of SARS-CoV-2 viral copies this assay can detect is 250  copies / mL. A negative result does not preclude SARS-CoV-2 infection  and should not be used as the sole basis for treatment or other  patient management decisions.  A negative result may occur with  improper specimen collection / handling, submission of specimen other  than nasopharyngeal swab, presence of viral mutation(s) within the  areas targeted by this assay, and inadequate number of viral copies  (<250 copies / mL). A negative result must be combined  with clinical  observations, patient history, and epidemiological information. If result is POSITIVE SARS-CoV-2 target nucleic acids are DETECTED. The SARS-CoV-2 RNA is generally detectable in upper and lower  respiratory specimens dur ing the acute phase of infection.  Positive  results are indicative of active infection with SARS-CoV-2.  Clinical  correlation with patient history and other diagnostic information is  necessary to determine patient infection status.  Positive results do  not rule out bacterial infection or co-infection with other viruses. If result is PRESUMPTIVE POSTIVE SARS-CoV-2 nucleic acids MAY BE PRESENT.   A presumptive positive result was obtained on the submitted specimen  and confirmed on repeat testing.  While 2019 novel coronavirus  (SARS-CoV-2) nucleic acids may be present in the submitted sample  additional confirmatory testing may be necessary for epidemiological  and / or  clinical management purposes  to differentiate between  SARS-CoV-2 and other Sarbecovirus currently known to infect humans.  If clinically indicated additional testing with an alternate test  methodology [phone]) is advised. The SARS-CoV-2 RNA is generally  detectable in upper and lower respiratory sp ecimens during the acute  phase of infection. The expected result is Negative. Fact Sheet for Patients:  StrictlyIdeas.no Fact Sheet for Healthcare Providers: BankingDealers.co.za This test is not yet approved or cleared by the [place] FDA and has been authorized for detection and/or diagnosis of SARS-CoV-2 by FDA under an Emergency Use Authorization (EUA).  This EUA will remain in effect (meaning this test can be used) for the duration of the COVID-19 declaration under Section 564(b)(1) of the Act, 21 U.S.C. section 360bbb-3(b)(1), unless the authorization is terminated or revoked sooner. Performed at [facility], [place] [address] [place], [place] [address]          Radiology Studies: Ct Angio Head W Or Wo Contrast  Result Date: 11/11/2018 CLINICAL DATA:  Blurred vision of the right eye. Headache, acute, normal neuro exam. Abnormal ophthalmologic exam. EXAM: CT ANGIOGRAPHY HEAD TECHNIQUE: Multidetector CT imaging of the head was performed using the standard protocol during bolus administration of intravenous contrast. Multiplanar CT image reconstructions and MIPs were obtained to evaluate the vascular anatomy. CONTRAST:  32m OMNIPAQUE IOHEXOL 350 MG/ML SOLN COMPARISON:  MRI brain and orbits 11/10/2018 FINDINGS: CT HEAD Brain: CT head without and with contrast is within normal limits. No acute infarct, hemorrhage, or mass lesion is present. No significant white matter lesions are present. The ventricles are of normal size. No significant extraaxial fluid collection is present. Vascular: No hyperdense vessel or unexpected  calcification. Skull: Calvarium is intact. No focal lytic or blastic lesions are present. Sinuses: The paranasal sinuses and mastoid air cells are clear. Orbits: The globes and orbits are within normal limits. CTA HEAD Anterior circulation: Internal carotid arteries are within normal limits from the high cervical segments through the ICA termini. A1 and M1 segments are normal. ACA and MCA branch vessels are normal. MCA bifurcations are within normal limits. Focal vascular abnormality is present in the orbits. The ophthalmic artery is not discretely visualized. Superior ophthalmic veins are normal bilaterally. Posterior circulation: The vertebral arteries are codominant. PICA origin origins are visualized and normal. Basilar artery is normal. Both posterior cerebral arteries originate from the basilar tip. Prominent posterior communicating arteries present on right. PCA branch vessels are normal. Venous sinuses: Dural sinuses are patent. The right transverse sinus is dominant. Straight sinus deep cerebral veins are intact. Cortical veins are unremarkable. Anatomic variants: Prominent right posterior communicating artery. Delayed phase: No pathologic  enhancement IMPRESSION: Normal CTA circle-of-Willis without significant proximal stenosis, aneurysm, or branch vessel occlusion. No focal vascular abnormality within the orbits. Electronically Signed   By: San Morelle M.D.   On: 11/11/2018 12:23   Mr Jeri Cos And Wo Contrast  Result Date: 11/10/2018 CLINICAL DATA:  Papilledema with new headache that started 7 weeks ago. Blurry vision beginning last week. EXAM: MRI HEAD AND ORBITS WITHOUT AND WITH CONTRAST TECHNIQUE: Multiplanar, multiecho pulse sequences of the brain and surrounding structures were obtained without and with intravenous contrast. Multiplanar, multiecho pulse sequences of the orbits and surrounding structures were obtained including fat saturation techniques, before and after intravenous  contrast administration. CONTRAST:  5 mL Gadavist COMPARISON:  None. FINDINGS: MRI HEAD FINDINGS BRAIN: There is no acute infarct, acute hemorrhage or extra-axial collection. The midline structures are normal. There is no midline shift or mass effect. The white matter signal is normal for the patient's age. The cerebral and cerebellar volume are age-appropriate. There is no hydrocephalus. Susceptibility-sensitive sequences show no chronic microhemorrhage or superficial siderosis. VASCULAR: The major intracranial arterial and venous sinus flow voids are normal. SKULL AND UPPER CERVICAL SPINE: Calvarial bone marrow signal is normal. There is no skull base mass. The visualized upper cervical spine and soft tissues are normal. MRI ORBITS FINDINGS Orbits: --Globes: Normal. --Bony orbit: Normal. --Preseptal soft tissues: Normal. --Intra- and extraconal orbital fat: Normal. No inflammatory stranding. --Optic nerves: Normal. --Lacrimal glands and fossae: Normal. --Extraocular muscles: Normal. Visualized sinuses:  No fluid levels or advanced mucosal thickening. Soft tissues: Normal. Limited intracranial: Normal. IMPRESSION: Normal brain and orbits. Electronically Signed   By: Ulyses Jarred M.D.   On: 11/10/2018 23:04   Mr Cervical Spine W Wo Contrast  Result Date: 11/11/2018 CLINICAL DATA:  Papilledema and headache EXAM: MRI CERVICAL SPINE WITHOUT AND WITH CONTRAST TECHNIQUE: Multiplanar and multiecho pulse sequences of the cervical spine, to include the craniocervical junction and cervicothoracic junction, were obtained without and with intravenous contrast. CONTRAST:  5 mL Gadavist COMPARISON:  None. FINDINGS: Alignment: Grade 1 anterolisthesis at C4-5. Vertebrae: No fracture, evidence of discitis, or bone lesion. Cord: Normal signal and morphology. Posterior Fossa, vertebral arteries, paraspinal tissues: Negative. Disc levels: C1-2: Normal. C2-3: No disc herniation or stenosis. C3-4: Left facet hypertrophy. No spinal  canal or neural foraminal stenosis. No disc herniation. C4-5: No disc herniation or stenosis.  Mild right facet hypertrophy. C5-6: Left-greater-than-right facet hypertrophy. No spinal canal or neural foraminal stenosis. C6-7: Mild uncovertebral hypertrophy, left-greater-than-right with mild bilateral foraminal narrowing. C7-T1: No disc herniation or stenosis. T1-2: Normal. T2-3: Normal. IMPRESSION: 1. No cervical spinal cord abnormality. 2. Mild multilevel facet arthrosis with mild bilateral C7 neural foraminal narrowing. 3. No cervical spinal canal stenosis. Electronically Signed   By: Ulyses Jarred M.D.   On: 11/11/2018 00:07   Mr Rosealee Albee PO Contrast  Result Date: 11/10/2018 CLINICAL DATA:  Papilledema with new headache that started 7 weeks ago. Blurry vision beginning last week. EXAM: MRI HEAD AND ORBITS WITHOUT AND WITH CONTRAST TECHNIQUE: Multiplanar, multiecho pulse sequences of the brain and surrounding structures were obtained without and with intravenous contrast. Multiplanar, multiecho pulse sequences of the orbits and surrounding structures were obtained including fat saturation techniques, before and after intravenous contrast administration. CONTRAST:  5 mL Gadavist COMPARISON:  None. FINDINGS: MRI HEAD FINDINGS BRAIN: There is no acute infarct, acute hemorrhage or extra-axial collection. The midline structures are normal. There is no midline shift or mass effect. The white matter signal is normal for  the patient's age. The cerebral and cerebellar volume are age-appropriate. There is no hydrocephalus. Susceptibility-sensitive sequences show no chronic microhemorrhage or superficial siderosis. VASCULAR: The major intracranial arterial and venous sinus flow voids are normal. SKULL AND UPPER CERVICAL SPINE: Calvarial bone marrow signal is normal. There is no skull base mass. The visualized upper cervical spine and soft tissues are normal. MRI ORBITS FINDINGS Orbits: --Globes: Normal. --Bony orbit:  Normal. --Preseptal soft tissues: Normal. --Intra- and extraconal orbital fat: Normal. No inflammatory stranding. --Optic nerves: Normal. --Lacrimal glands and fossae: Normal. --Extraocular muscles: Normal. Visualized sinuses:  No fluid levels or advanced mucosal thickening. Soft tissues: Normal. Limited intracranial: Normal. IMPRESSION: Normal brain and orbits. Electronically Signed   By: Ulyses Jarred M.D.   On: 11/10/2018 23:04        Scheduled Meds:  pantoprazole  40 mg Oral Daily   scopolamine  1 patch Transdermal Once   Continuous Infusions:  sodium chloride 10 mL/hr at 11/11/18 1243   methylPREDNISolone (SOLU-MEDROL) injection 1,000 mg (11/12/18 1050)     LOS: 1 day    Time spent: 64mn    PDomenic Polite MD [facility] Page via [email or web address], password [email or web address] After 7PM please contact night-coverage  11/12/2018, 1:33 PM

## 2018-11-13 DIAGNOSIS — H3581 Retinal edema: Secondary | ICD-10-CM

## 2018-11-13 DIAGNOSIS — H539 Unspecified visual disturbance: Secondary | ICD-10-CM

## 2018-11-13 DIAGNOSIS — R51 Headache: Secondary | ICD-10-CM

## 2018-11-13 LAB — BARTONELLA ANTIBODY PANEL
B Quintana IgM: NEGATIVE titer
B henselae IgG: NEGATIVE titer
B henselae IgM: NEGATIVE titer
B quintana IgG: NEGATIVE titer

## 2018-11-13 LAB — QUANTIFERON-TB GOLD PLUS (RQFGPL)
QuantiFERON Mitogen Value: 1.51 IU/mL
QuantiFERON Nil Value: 0.02 IU/mL
QuantiFERON TB1 Ag Value: 0.02 IU/mL
QuantiFERON TB2 Ag Value: 0.02 IU/mL

## 2018-11-13 LAB — RPR: RPR Ser Ql: NONREACTIVE

## 2018-11-13 LAB — QUANTIFERON-TB GOLD PLUS: QuantiFERON-TB Gold Plus: NEGATIVE

## 2018-11-13 MED ORDER — DOXYCYCLINE HYCLATE 100 MG PO TABS
100.0000 mg | ORAL_TABLET | Freq: Two times a day (BID) | ORAL | Status: DC
  Administered 2018-11-13 – 2018-11-14 (×3): 100 mg via ORAL
  Filled 2018-11-13 (×3): qty 1

## 2018-11-13 NOTE — Progress Notes (Addendum)
Patient ID: [name], female   DOB: 1965-04-29, 53 y.o.   MRN: [id number]  Check media section for images obtained in office 11/10/2018.   Visual Field OD: Cecocentral scotoma OS: Enlarged blindspot and superior defect  Fundus Photos OD: Sectoral superior optic nerve head edema OS: Sectoral inferior optic nerve head edema  OCT OD: Extensive exudative macular edema/subretinal fluid ONH/RNFL: Bilateral severe optic nerve head edema  With bilateral optic nerve head edema an intracranial mass or intracranial hypertension must be ruled out. She has already had an MRI that was essentially normal with no areas of enhancement and a lumbar puncture with normal opening pressure and normal CSF constituents. At this point the etiology can be narrowed down to infectious or inflammatory etiologies. GCA is a consideration but she does not fit the demographic and we have a negative biopsy specimen. Of note, she had several small areas of focal choroiditis on examination 11/10/2018. Considering the focal choroiditis and the extensive right macular edema - and also taking into account her one month history of malaise and intermittent fevers - I think it's reasonable to treat empirically for Cat-Scratch Disease and would also obtain bartonella serology.  Discussed with other members of the care team. Will continue to follow peripherally.  Midge Aver, MD - Ophthalmology [phone]  11/13/2018 - 3:07 PM - Addendum: Bartonella serology negative. If she is discharged today I think it would be fine for her to complete a course of Valtrex and Doxycycline as empiric treatment for a possible viral or atypical bacterial etiology. I will plan to see her in the office early next week.  Midge Aver

## 2018-11-13 NOTE — Progress Notes (Signed)
Patient ID: [name], female   DOB: May 21, 1965, 53 y.o.   MRN: [id number] Right preauricular incision healing. Temporal artery negative for inflammatory changes Plan per medical team.  Follow-up with [place] as needed.  Does not need to see me in the office for follow-up of biopsy unless there are concerns regarding her wound.  Discussed this with patient who understands

## 2018-11-13 NOTE — Progress Notes (Addendum)
Reason for consult: vision loss right eye  Subjective: The patient stated that her vision was about the same today. She continues to experience poor vision in both eyes although the right eye appears to be affected more significantly. She denied headache, fever, chills, nausea or vomiting today.   ROS: negative except above  Examination  Vital signs in last 24 hours: Temp:  [98.2 F (36.8 C)-99.3 F (37.4 C)] 99.2 F (37.3 C) (07/24 1302) Pulse Rate:  [84-94] 94 (07/24 1302) Resp:  [16-18] 16 (07/24 1302) BP: (107-133)/(60-69) 133/69 (07/24 1302) SpO2:  [96 %-97 %] 96 % (07/24 1302)  General: lying in bed CVS: pulse-normal rate and rhythm RS: breathing comfortably Extremities: normal   Neuro: MS: Alert, oriented, follows commands CN: pupils equal and reactive,  EOMI, central visual acuity diminished in the right greater than the left with intact peripheral vision   Motor: Did not test Reflexes: Did not test Coordination: Did not test Gait: not tested  Basic Metabolic Panel: Recent Labs  Lab 11/10/18 1910 11/11/18 0321  NA 139 138  K 3.8 4.2  CL 105 104  CO2 25 24  GLUCOSE 114* 129*  BUN 13 12  CREATININE 0.84 0.81  CALCIUM 8.6* 8.6*  MG 2.4  --    CBC: Recent Labs  Lab 11/10/18 1910 11/11/18 0321  WBC 8.2 7.8  NEUTROABS 5.5  --   HGB 13.7 13.7  HCT 42.3 41.2  MCV 98.4 97.9  PLT 257 250   Coagulation Studies: No results for input(s): LABPROT, INR in the last 72 hours.  Imaging Reviewed:   ASSESSMENT AND PLAN  Assessment: Visual acuity remains affected. Patient completing steroid course today. Etiology uncertain although this appears most likely to be a infectious vs inflammatory process. MRI unremarkable for optic neuritis or other intracranial abnormality. ESR elevated to 51 with CRP 1.9 both minimally above upper normal limit so less likely inflammatory process. CSF cell count and gram stain unremarkable. Several CSF studies remain for other  infectious processes. GCA considered but with the nearly normal ESR, normal biopsy and inconsistent pattern this seem less likely. Agree that bartonella is indeed possible with her history of fevers and malaise.   Impression: Bilateral papilledema with right vision scotoma Concerning for neuroretinitis  Concern for occular bartonella remains high enough to treat empirically   Plan: Okay to wean from steroids Agree with doxycyline for empiric coverage of bartonella Awaiting final results of Lyme, bartonella, toxoplasma, EBV studies Okay for discharge home today with close f/up with Ophthalmology per neurology  Sushanth Aroor [facility] Pager Number [phone] For questions after 7pm please refer to AMION to reach the Neurologist on call   NEUROHOSPITALIST ADDENDUM Performed a face to face diagnostic evaluation.   I have reviewed the contents of history and physical exam as documented by PA/ARNP/Resident and agree with above documentation.  I have discussed and formulated the above plan as documented. Edits to the note have been made as needed.  Temporal artery biopsy negative.  No longer need to give high-dose steroids.  Recommend weaning off steroids.  Agree with doxycycline for empiric coverage of Bartonella as well as Lyme.  Follow-up remain infectious disease labs.  Patient needs close outpatient opththalmalogy  follow-up and consider neuro-ophthalmology referral    Sushanth Aroor MD [facility] [phone]   If 7pm to 7am, please call on call as listed on AMION.

## 2018-11-13 NOTE — Progress Notes (Signed)
[name] for Infectious Disease   Reason for visit: Follow up on visual disturbance  Interval History: discussed case with Dr. Katy Fitch and findings noted on his exam from 7/21 giving more clarification of the findings.  She remains the same, no pain, same visual issues, no fever.  Started on doxycycline at the recommendation of Dr. Katy Fitch for presumed Bartonella Finishing steroids today  Physical Exam: Constitutional:  Vitals:   11/12/18 2130 11/13/18 0616  BP: 110/61 121/60  Pulse: 88 84  Resp: 16 16  Temp: 98.5 F (36.9 C) 98.2 F (36.8 C)  SpO2: 96% 97%   patient appears in NAD Eyes: anicteric, no conjunctivitis  HENT: no thrush Respiratory: Normal respiratory effort; CTA B Cardiovascular: RRR GI: soft, nt, nd  Review of Systems: Constitutional: negative for fevers and chills Integument/breast: negative for rash Musculoskeletal: negative for myalgias and arthralgias  Lab Results  Component Value Date   WBC 7.8 11/11/2018   HGB 13.7 11/11/2018   HCT 41.2 11/11/2018   MCV 97.9 11/11/2018   PLT 250 11/11/2018    Lab Results  Component Value Date   CREATININE 0.81 11/11/2018   BUN 12 11/11/2018   NA 138 11/11/2018   K 4.2 11/11/2018   CL 104 11/11/2018   CO2 24 11/11/2018    Lab Results  Component Value Date   ALT 14 08/30/2015   AST 15 08/30/2015   ALKPHOS 88 08/30/2015     Microbiology: Recent Results (from the past 240 hour(s))  Culture, blood (routine x 2)     Status: None (Preliminary result)   Collection Time: 11/10/18  6:50 PM   Specimen: BLOOD  Result Value Ref Range Status   Specimen Description BLOOD BLOOD RIGHT FOREARM  Final   Special Requests   Final    BOTTLES DRAWN AEROBIC AND ANAEROBIC Blood Culture results may not be optimal due to an inadequate volume of blood received in culture bottles   Culture   Final    NO GROWTH 3 DAYS Performed at [facility], [place] [address] [place], [place] [address]    Report Status PENDING   Incomplete  Culture, blood (routine x 2)     Status: None (Preliminary result)   Collection Time: 11/10/18  7:10 PM   Specimen: BLOOD  Result Value Ref Range Status   Specimen Description BLOOD LEFT ANTECUBITAL  Final   Special Requests   Final    BOTTLES DRAWN AEROBIC AND ANAEROBIC Blood Culture results may not be optimal due to an inadequate volume of blood received in culture bottles   Culture   Final    NO GROWTH 3 DAYS Performed at [facility], [place] [address], [place], [place] [address]    Report Status PENDING  Incomplete  SARS Coronavirus 2 (CEPHEID - Performed in [place] hospital lab), Hosp Order     Status: None   Collection Time: 11/10/18  8:24 PM   Specimen: Nasopharyngeal Swab  Result Value Ref Range Status   SARS Coronavirus 2 NEGATIVE NEGATIVE Final    Comment: (NOTE) If result is NEGATIVE SARS-CoV-2 target nucleic acids are NOT DETECTED. The SARS-CoV-2 RNA is generally detectable in upper and lower  respiratory specimens during the acute phase of infection. The lowest  concentration of SARS-CoV-2 viral copies this assay can detect is 250  copies / mL. A negative result does not preclude SARS-CoV-2 infection  and should not be used as the sole basis for treatment or other  patient  management decisions.  A negative result may occur with  improper specimen collection / handling, submission of specimen other  than nasopharyngeal swab, presence of viral mutation(s) within the  areas targeted by this assay, and inadequate number of viral copies  (<250 copies / mL). A negative result must be combined with clinical  observations, patient history, and epidemiological information. If result is POSITIVE SARS-CoV-2 target nucleic acids are DETECTED. The SARS-CoV-2 RNA is generally detectable in upper and lower  respiratory specimens dur ing the acute phase of infection.  Positive  results are indicative of active infection with SARS-CoV-2.  Clinical  correlation  with patient history and other diagnostic information is  necessary to determine patient infection status.  Positive results do  not rule out bacterial infection or co-infection with other viruses. If result is PRESUMPTIVE POSTIVE SARS-CoV-2 nucleic acids MAY BE PRESENT.   A presumptive positive result was obtained on the submitted specimen  and confirmed on repeat testing.  While 2019 novel coronavirus  (SARS-CoV-2) nucleic acids may be present in the submitted sample  additional confirmatory testing may be necessary for epidemiological  and / or clinical management purposes  to differentiate between  SARS-CoV-2 and other Sarbecovirus currently known to infect humans.  If clinically indicated additional testing with an alternate test  methodology [phone]) is advised. The SARS-CoV-2 RNA is generally  detectable in upper and lower respiratory sp ecimens during the acute  phase of infection. The expected result is Negative. Fact Sheet for Patients:  StrictlyIdeas.no Fact Sheet for Healthcare Providers: BankingDealers.co.za This test is not yet approved or cleared by the [place] FDA and has been authorized for detection and/or diagnosis of SARS-CoV-2 by FDA under an Emergency Use Authorization (EUA).  This EUA will remain in effect (meaning this test can be used) for the duration of the COVID-19 declaration under Section 564(b)(1) of the Act, 21 U.S.C. section 360bbb-3(b)(1), unless the authorization is terminated or revoked sooner. Performed at [facility], [place] [address] [place], [place] [address]     Impression/Plan:  1. Bilateral visual disturbances with significant optic nerve inflammation, macular edema - no mass effect or increase ICP.  Differential is broad and includes rheumatic/autoimmune such as sarcoid, SLE, though CXR ok, no anemia;  Infectious such as viral infections including many vaccine preventable  infections, EBV, herpes, parasitic infections such as toxo (though more typical uveitis), bacterial such as Bartonella, syphillis On doxycycline for presumed Bartonella per Dr. Katy Fitch as the most likely/typical culprit.  Serology sent.    2. Headaches - improved/resolved.

## 2018-11-13 NOTE — Evaluation (Signed)
Physical Therapy Evaluation Patient Details Name: [name] MRN: [id number] DOB: 01-09-1966 Today's Date: 11/13/2018   History of Present Illness  53 y.o. female with medical history significant of arthritis.  Patient presents to the ED with c/o multiple weeks (at least a month+) of generalized malaise, intermittent fevers, bitemporal headaches.  On Wed patient noticed she was having abnormal vision out of her right eye.  She states with both eyes open and appears blurry, but if she closes her right eye she sees normally.  She closes her left eye she notices on the nasal aspect an area of decreased vision, essentially describing scotoma.  Due to the visual changes she saw an ophthalmologist today who noted papilledema and referred her for neurological evaluation.    Clinical Impression  Pt is independent with mobility, gait and stairs.  No PT needs identified at this time.    Follow Up Recommendations No PT follow up    Equipment Recommendations  None recommended by PT    Recommendations for Other Services       Precautions / Restrictions Precautions Precautions: None Restrictions Weight Bearing Restrictions: No      Mobility  Bed Mobility Overal bed mobility: Independent                Transfers Overall transfer level: Independent                  Ambulation/Gait Ambulation/Gait assistance: Independent Gait Distance (Feet): 250 Feet Assistive device: None Gait Pattern/deviations: WFL(Within Functional Limits)        Stairs Stairs: Yes Stairs assistance: Independent Stair Management: One rail Right Number of Stairs: 12 General stair comments: reciprocal pattern  Wheelchair Mobility    Modified Rankin (Stroke Patients Only)       Balance Overall balance assessment: Independent                                           Pertinent Vitals/Pain Pain Assessment: No/denies pain    Home Living Family/patient expects to be  discharged to:: Private residence Living Arrangements: Spouse/significant other;Children Available Help at Discharge: Family;Available PRN/intermittently Type of Home: House Home Access: Stairs to enter   [employer] of Steps: 6 Home Layout: Multi-level        Prior Function Level of Independence: Independent               Hand Dominance        Extremity/Trunk Assessment   Upper Extremity Assessment Upper Extremity Assessment: Overall WFL for tasks assessed    Lower Extremity Assessment Lower Extremity Assessment: Overall WFL for tasks assessed    Cervical / Trunk Assessment Cervical / Trunk Assessment: Normal  Communication   Communication: No difficulties  Cognition Arousal/Alertness: Awake/alert Behavior During Therapy: WFL for tasks assessed/performed Overall Cognitive Status: Within Functional Limits for tasks assessed                                        General Comments      Exercises     Assessment/Plan    PT Assessment Patent does not need any further PT services  PT Problem List         PT Treatment Interventions      PT Goals (Current goals can be found in the Care  Plan section)  Acute Rehab PT Goals PT Goal Formulation: All assessment and education complete, DC therapy    Frequency     Barriers to discharge        Co-evaluation               AM-PAC PT "6 Clicks" Mobility  Outcome Measure Help needed turning from your back to your side while in a flat bed without using bedrails?: None Help needed moving from lying on your back to sitting on the side of a flat bed without using bedrails?: None Help needed moving to and from a bed to a chair (including a wheelchair)?: None Help needed standing up from a chair using your arms (e.g., wheelchair or bedside chair)?: None Help needed to walk in hospital room?: None Help needed climbing 3-5 steps with a railing? : None 6 Click Score: 24    End of  Session   Activity Tolerance: Patient tolerated treatment well Patient left: in bed;with call bell/phone within reach Nurse Communication: Mobility status      Time: 2395-3202 PT Time Calculation (min) (ACUTE ONLY): 11 min   Charges:   PT Evaluation $PT Eval Low Complexity: 1 Low          [name], PT,[name]  [name] 11/13/2018, 9:49 AM

## 2018-11-13 NOTE — Progress Notes (Signed)
PROGRESS NOTE    [name]  SFK:[id number] DOB: [birth year] DOA: 11/10/2018 PCP: Patient, No Pcp Per  Brief Narrative:[name] is a 53 y.o. female with medical history significant of arthritis.  Patient presents to the ED with c/o multiple weeks (at least a month+) of generalized malaise, intermittent fevers, bitemporal headaches.  On Wed patient noticed she was having abnormal vision out of her right eye.  She states with both eyes open and appears blurry, but if she closes her right eye she sees normally. She closes her left eye she notices on the nasal aspect an area of decreased vision, essentially describing scotoma.Due to the visual changes she saw an ophthalmologist on wednesday who noted papilledema and referred her to ED for neurological evaluation.   Assessment & Plan:   Neuroretinitis -Presented with headache, vision loss with central scotoma especially in right eye  -in background of malaise,intermittent fevers for 9month headache improved -LP was unremarkable, normal protein, glucose, cell counts, opening pressure was 148mg -ESR 52 -Temporal artery biopsy 7/22: Noted normal vessel -Neurology, infectious disease, ophthalmology following -She is status post 3 doses of high-dose IV Solu-Medrol for presumed GCA which appears less likely -On empiric valacyclovir day 2 and day 1 of doxycycline to empirically treat neuroretinitis by Bartonella -Bartonella antibody pending -RPR negative, HIV negative, Lyme antibody pending, QuantiFERON gold is negative -Will consider starting low-dose prednisone tomorrow, await consultant opinions  DVT prophylaxis: Lovenox Code Status: Full code Family Communication: No family at bedside Disposition Plan: Home pending above work-up  Consultants:   Vascular surgery  Neurology   Procedures: Temporal artery biopsy  Antimicrobials:    Subjective: -Feels okay, vision changes in right eye especially large central scotoma persist  Objective: Vitals:   11/12/18 0517 11/12/18 1424 11/12/18 2130 11/13/18 0616  BP: 111/73 107/60 110/61 121/60  Pulse: 72 88 88 84  Resp:  _0 Temp: 98.2 F (36.8 C) 99.3 F (37.4 C) 98.5 F (36.9 C) 98.2 F (36.8 C)  TempSrc: Oral Oral    SpO2: 97% 97% 96% 97%  Weight:      Height:        Intake/Output Summary (Last 24 hours) at 11/13/2018 1212 Last data filed at 11/12/2018 1741 Gross per 24 hour  Intake -  Output 1 ml  Net -1 ml   Filed Weights   11/11/18 0427 11/11/18 1235  Weight: 67.3 kg 67.3 kg    Examination:  General exam: Appears calm and comfortable  Respiratory system: Clear bilaterally Cardiovascular system: S1 & S2 heard, RRR  Gastrointestinal system: Abdomen is nondistended, soft and nontender.Normal bowel sounds heard. Central nervous system: Alert and oriented. No focal neurological deficits. Extremities: No edema Skin: No rashes, lesions or ulcers Psychiatry: Judgement and insight appear normal. Mood & affect appropriate.     Data Reviewed:   CBC: Recent Labs  Lab 11/10/18 1910 11/11/18 0321  WBC 8.2 7.8  NEUTROABS 5.5  --   HGB 13.7 13.7  HCT 42.3 41.2  MCV 98.4 97.9  PLT 257 25174 Basic Metabolic Panel: Recent Labs  Lab 11/10/18 1910 11/11/18 0321  NA 139 138  K 3.8 4.2  CL 105 104  CO2 25 24  GLUCOSE 114* 129*  BUN 13 12  CREATININE 0.84 0.81  CALCIUM 8.6* 8.6*  MG 2.4  --    GFR: Estimated Creatinine Clearance: 67.9 mL/min (by C-G formula based on SCr of 0.81 mg/dL). Liver Function Tests: No results for input(s): AST, ALT,  ALKPHOS, BILITOT, PROT, ALBUMIN in the last 168 hours. No results for input(s): LIPASE, AMYLASE in the last 168 hours. No results for input(s): AMMONIA in the last 168 hours. Coagulation Profile: No results for input(s): INR, PROTIME in the last 168 hours. Cardiac Enzymes: No results for input(s): CKTOTAL, CKMB, CKMBINDEX, TROPONINI in the last 168 hours. BNP (last 3 results) No results for  input(s): PROBNP in the last 8760 hours. HbA1C: No results for input(s): HGBA1C in the last 72 hours. CBG: No results for input(s): GLUCAP in the last 168 hours. Lipid Profile: No results for input(s): CHOL, HDL, LDLCALC, TRIG, CHOLHDL, LDLDIRECT in the last 72 hours. Thyroid Function Tests: No results for input(s): TSH, T4TOTAL, FREET4, T3FREE, THYROIDAB in the last 72 hours. Anemia Panel: No results for input(s): VITAMINB12, FOLATE, FERRITIN, TIBC, IRON, RETICCTPCT in the last 72 hours. Urine analysis:    Component Value Date/Time   BILIRUBINUR negative 08/30/2015 1033   KETONESUR negative 08/30/2015 1033   PROTEINUR negative 08/30/2015 1033   UROBILINOGEN 0.2 08/30/2015 1033   NITRITE Negative 08/30/2015 1033   LEUKOCYTESUR Negative 08/30/2015 1033   Sepsis Labs: _0 (procalcitonin:4,lacticidven:4)  ) Recent Results (from the past 240 hour(s))  Culture, blood (routine x 2)     Status: None (Preliminary result)   Collection Time: 11/10/18  6:50 PM   Specimen: BLOOD  Result Value Ref Range Status   Specimen Description BLOOD BLOOD RIGHT FOREARM  Final   Special Requests   Final    BOTTLES DRAWN AEROBIC AND ANAEROBIC Blood Culture results may not be optimal due to an inadequate volume of blood received in culture bottles   Culture   Final    NO GROWTH 3 DAYS Performed at [facility], [place] [address], [place], [place] [address]    Report Status PENDING  Incomplete  Culture, blood (routine x 2)     Status: None (Preliminary result)   Collection Time: 11/10/18  7:10 PM   Specimen: BLOOD  Result Value Ref Range Status   Specimen Description BLOOD LEFT ANTECUBITAL  Final   Special Requests   Final    BOTTLES DRAWN AEROBIC AND ANAEROBIC Blood Culture results may not be optimal due to an inadequate volume of blood received in culture bottles   Culture   Final    NO GROWTH 3 DAYS Performed at [facility], [place] [address] [place], [place] [address]    Report  Status PENDING  Incomplete  SARS Coronavirus 2 (CEPHEID - Performed in [place] hospital lab), Hosp Order     Status: None   Collection Time: 11/10/18  8:24 PM   Specimen: Nasopharyngeal Swab  Result Value Ref Range Status   SARS Coronavirus 2 NEGATIVE NEGATIVE Final    Comment: (NOTE) If result is NEGATIVE SARS-CoV-2 target nucleic acids are NOT DETECTED. The SARS-CoV-2 RNA is generally detectable in upper and lower  respiratory specimens during the acute phase of infection. The lowest  concentration of SARS-CoV-2 viral copies this assay can detect is 250  copies / mL. A negative result does not preclude SARS-CoV-2 infection  and should not be used as the sole basis for treatment or other  patient management decisions.  A negative result may occur with  improper specimen collection / handling, submission of specimen other  than nasopharyngeal swab, presence of viral mutation(s) within the  areas targeted by this assay, and inadequate number of viral copies  (<250 copies / mL). A negative result must be combined with clinical  observations, patient history, and epidemiological information. If result is POSITIVE SARS-CoV-2 target nucleic acids are DETECTED. The SARS-CoV-2 RNA is generally detectable in upper and lower  respiratory specimens dur ing the acute phase of infection.  Positive  results are indicative of active infection with SARS-CoV-2.  Clinical  correlation with patient history and other diagnostic information is  necessary to determine patient infection status.  Positive results do  not rule out bacterial infection or co-infection with other viruses. If result is PRESUMPTIVE POSTIVE SARS-CoV-2 nucleic acids MAY BE PRESENT.   A presumptive positive result was obtained on the submitted specimen  and confirmed on repeat testing.  While 2019 novel coronavirus  (SARS-CoV-2) nucleic acids may be present in the submitted sample  additional confirmatory testing may be  necessary for epidemiological  and / or clinical management purposes  to differentiate between  SARS-CoV-2 and other Sarbecovirus currently known to infect humans.  If clinically indicated additional testing with an alternate test  methodology [phone]) is advised. The SARS-CoV-2 RNA is generally  detectable in upper and lower respiratory sp ecimens during the acute  phase of infection. The expected result is Negative. Fact Sheet for Patients:  StrictlyIdeas.no Fact Sheet for Healthcare Providers: BankingDealers.co.za This test is not yet approved or cleared by the [place] FDA and has been authorized for detection and/or diagnosis of SARS-CoV-2 by FDA under an Emergency Use Authorization (EUA).  This EUA will remain in effect (meaning this test can be used) for the duration of the COVID-19 declaration under Section 564(b)(1) of the Act, 21 U.S.C. section 360bbb-3(b)(1), unless the authorization is terminated or revoked sooner. Performed at [facility], [place] [address], [place], [place] [address]          Radiology Studies: Dg Chest 2 View  Result Date: 11/12/2018 CLINICAL DATA:  Chronic body aches, fever and headaches. Loss of vision at the right eye. EXAM: CHEST - 2 VIEW COMPARISON:  Chest radiograph performed 04/02/2018 FINDINGS: The lungs are well-aerated and clear. There is no evidence of focal opacification, pleural effusion or pneumothorax. The heart is normal in size; the mediastinal contour is within normal limits. No acute osseous abnormalities are seen. IMPRESSION: No acute cardiopulmonary process seen. Electronically Signed   By: Garald Balding M.D.   On: 11/12/2018 19:19        Scheduled Meds: . doxycycline  100 mg Oral Q12H  . enoxaparin (LOVENOX) injection  40 mg Subcutaneous Q24H  . pantoprazole  40 mg Oral Daily  . scopolamine  1 patch Transdermal Once  . valACYclovir  500 mg Oral TID   Continuous  Infusions: . sodium chloride 10 mL/hr at 11/11/18 1243     LOS: 2 days    Time spent: 2mn    PDomenic Polite MD [facility] Page via [email or web address], password [email or web address] After 7PM please contact night-coverage  11/13/2018, 12:12 PM

## 2018-11-13 NOTE — TOC Initial Note (Signed)
Transition of Care [facility]) - Initial/Assessment Note    Patient Details  Name: [name] MRN: [id number] Date of Birth: 1966-04-04  Transition of [facility]) CM/SW Contact:    Benard Halsted, LCSW Phone Number: 11/13/2018, 3:42 PM  Clinical Narrative:                 CSW spoke with patient who reported that she does not have a PCP. She was agreeable to a follow up appointment at [place]. Appointment info on AVS.  Expected Discharge Plan: Home/Self Care Barriers to Discharge: Continued Medical Work up   Patient Goals and CMS Choice Patient states their goals for this hospitalization and ongoing recovery are:: Return home   Choice offered to / list presented to : Patient  Expected Discharge Plan and Services Expected Discharge Plan: Home/Self Care In-house Referral: PCP / [facility] Discharge Planning Services: Follow-up appt scheduled Post Acute Care Choice: NA Living arrangements for the past 2 months: Single Family Home Expected Discharge Date: 11/14/18                         [facility] Arranged: NA          Prior Living Arrangements/Services Living arrangements for the past 2 months: Single Family Home Lives with:: Spouse Patient language and need for interpreter reviewed:: Yes Do you feel safe going back to the place where you live?: Yes      Need for Family Participation in Patient Care: No (Comment) Care giver support system in place?: Yes (comment)   Criminal Activity/Legal Involvement Pertinent to Current Situation/Hospitalization: No - Comment as needed  Activities of Daily Living Home Assistive Devices/Equipment: None ADL Screening (condition at time of admission) Patient's cognitive ability adequate to safely complete daily activities?: Yes Is the patient deaf or have difficulty hearing?: No Does the patient have difficulty seeing, even when wearing glasses/contacts?: No Does the patient have difficulty concentrating, remembering,  or making decisions?: No Patient able to express need for assistance with ADLs?: Yes Does the patient have difficulty dressing or bathing?: No Independently performs ADLs?: Yes (appropriate for developmental age) Does the patient have difficulty walking or climbing stairs?: No Weakness of Legs: None Weakness of Arms/Hands: None  Permission Sought/Granted Permission sought to share information with : Case Manager                Emotional Assessment Appearance:: Appears stated age Attitude/Demeanor/Rapport: Gracious Affect (typically observed): Accepting Orientation: : Oriented to Self, Oriented to Place, Oriented to  Time, Oriented to Situation Alcohol / Substance Use: Not Applicable Psych Involvement: No (comment)  Admission diagnosis:  Polyarthralgia [M25.50] Blurry vision [H53.8] Retro-orbital pain, unspecified laterality [H57.10] Patient Active Problem List   Diagnosis Date Noted  . 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 [place] [address] Phone: [phone] Fax: [phone]     Social Determinants of Health ([place]) Interventions    Readmission Risk Interventions No flowsheet data found.

## 2018-11-14 DIAGNOSIS — H4710 Unspecified papilledema: Secondary | ICD-10-CM

## 2018-11-14 DIAGNOSIS — H3093 Unspecified chorioretinal inflammation, bilateral: Secondary | ICD-10-CM | POA: Diagnosis present

## 2018-11-14 DIAGNOSIS — H5710 Ocular pain, unspecified eye: Secondary | ICD-10-CM

## 2018-11-14 LAB — TOXOPLASMA ANTIBODIES- IGG AND  IGM
Toxoplasma Antibody- IgM: <3 AU/mL (ref 0.0–7.9)
Toxoplasma IgG Ratio: <3 IU/mL (ref 0.0–7.1)

## 2018-11-14 MED ORDER — PREDNISONE 20 MG PO TABS
40.0000 mg | ORAL_TABLET | Freq: Every day | ORAL | Status: DC
  Administered 2018-11-14: 40 mg via ORAL
  Filled 2018-11-14: qty 2

## 2018-11-14 NOTE — Discharge Planning (Signed)
Nsg Discharge Note  Admit Date:  11/10/2018 Discharge date: 11/14/2018   Gary Fleet to be D/C'd Home per MD order.  AVS completed.    Discharge Medication: Allergies as of 11/14/2018   No Known Allergies     Medication List    STOP taking these medications   cyclobenzaprine 5 MG tablet Commonly known as: FLEXERIL   methocarbamol 500 MG tablet Commonly known as: ROBAXIN   naproxen sodium 550 MG tablet Commonly known as: Anaprox DS     TAKE these medications   CENTRUM SILVER 50+WOMEN PO Take 1 tablet by mouth daily.   ibuprofen 200 MG tablet Commonly known as: ADVIL Take 600 mg by mouth every 6 (six) hours as needed for moderate pain. What changed: Another medication with the same name was removed. Continue taking this medication, and follow the directions you see here.       Discharge Assessment: Vitals:   11/13/18 2141 11/14/18 0549  BP: 129/69 134/86  Pulse: 77 91  Resp: 16 16  Temp: 98.6 F (37 C) 97.9 F (36.6 C)  SpO2: 97% 96%   Skin clean, dry and intact without evidence of skin break down, no evidence of skin tears noted. IV catheter discontinued intact. Site without signs and symptoms of complications - no redness or edema noted at insertion site, patient denies c/o pain - only slight tenderness at site.  Dressing with slight pressure applied.  D/c Instructions-Education: Discharge instructions given to patient/family with verbalized understanding. D/c education completed with patient/family including follow up instructions, medication list, d/c activities limitations if indicated, with other d/c instructions as indicated by MD - patient able to verbalize understanding, all questions fully answered. Patient instructed to return to ED, call 911, or call MD for any changes in condition.  Patient escorted via [place], and D/C home via private auto.  Hiram Comber, RN

## 2018-11-15 LAB — CULTURE, BLOOD (ROUTINE X 2)
Culture: NO GROWTH
Culture: NO GROWTH

## 2018-11-15 LAB — RHEUMATOID FACTOR: Rheumatoid fact SerPl-aCnc: 12.8 [IU]/mL (ref 0.0–13.9)

## 2018-11-16 LAB — ANTI-DNA ANTIBODY, DOUBLE-STRANDED: ds DNA Ab: 1 IU/mL (ref 0–9)

## 2018-11-16 LAB — ANA: Anti Nuclear Antibody (ANA): NEGATIVE

## 2018-11-16 LAB — MISC LABCORP TEST (SEND OUT): Labcorp test code: 4210

## 2018-11-16 LAB — EBV AB TO VIRAL CAPSID AG PNL, IGG+IGM
EBV VCA IgG: 151 U/mL — ABNORMAL HIGH (ref 0.0–17.9)
EBV VCA IgM: <36 U/mL (ref 0.0–35.9)

## 2018-11-17 LAB — MPO/PR-3 (ANCA) ANTIBODIES
ANCA Proteinase 3: <3.5 U/mL (ref 0.0–3.5)
Myeloperoxidase Abs: <9 U/mL (ref 0.0–9.0)

## 2018-11-17 LAB — ANCA TITERS
Atypical P-ANCA titer: <1:20 {titer}
C-ANCA: <1:20 {titer}
P-ANCA: <1:20 {titer}

## 2018-11-18 NOTE — Discharge Summary (Addendum)
Physician Discharge Summary  [name]:[id number] DOB: 05-22-1965 DOA: 11/10/2018  PCP: Patient, No Pcp Per  Admit date: 11/10/2018 Discharge date: 11/14/2018  Time spent: 35 minutes  Recommendations for Outpatient Follow-up:  1. Ophthalmology Dr. Katy Fitch on Monday 2. Referral to retinal specialist and neuro-ophthalmologist 3. Follow-up Lyme serology and antiphospholipid antibody panel   Discharge Diagnoses:  Principal Problem:   Neuroretinitis of both eyes Active Problems:   Retro-orbital pain   Papilledema   Discharge Condition: Stable  Diet recommendation: Regular  Filed Weights   11/11/18 0427 11/11/18 1235  Weight: 67.3 kg 67.3 kg    History of present illness:  Sherri L Cravenis a 53 y.o.femalewith medical history significant ofarthritis. Patient presents to the ED with c/o multiple weeks (at least a month+) of generalized malaise, intermittent fevers, bitemporal headaches. On Wed patient noticed she was having abnormal vision out of her right eye. She states with both eyes open and appears blurry, but if she closes her right eye she sees normally. She closes her left eye she notices on the nasal aspect an area of decreased vision, essentially describing scotoma.Due to the visual changes she saw an ophthalmologist on wednesday who noted papilledema and referred her to ED for neurological evaluation.   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 GrKaty Fitchphthalmology, she will be discharged home today for reassessment in his office on Monday and likely referral to a retinal specialist and or [facility]   Discharge Exam: Vitals:   11/13/18 2141 11/14/18 0549  BP: 129/69 134/86  Pulse: 77 91  Resp: 16 16  Temp: 98.6 F (37 C) 97.9 F (36.6 C)  SpO2: 97% 96%    General: AAOx3 Cardiovascular: S1S2/RRR Respiratory: CTAB  Discharge Instructions   Discharge Instructions    Diet general   Complete by: As directed    Increase activity slowly   Complete by: As directed      Allergies as of 11/14/2018   No Known Allergies     Medication List    STOP taking these medications   cyclobenzaprine 5 MG tablet Commonly known as: FLEXERIL   methocarbamol 500 MG tablet Commonly known as: ROBAXIN   naproxen sodium 550 MG tablet Commonly known as: Anaprox DS     TAKE these medications   CENTRUM SILVER 50+WOMEN PO Take 1 tablet by mouth daily.   ibuprofen 200 MG tablet Commonly known as: ADVIL Take 600 mg by mouth every 6 (six) hours as needed for moderate pain. What changed: Another medication with the  same name was removed. Continue taking this medication, and follow the directions you see here.      No Known Allergies Follow-up Information    [facility]. Go on 12/11/2018.   Why: Hospital follow up appointment at 2:30pm with Dr. Sondra Barges information: [place] [address] [phone]       Warden Fillers, MD Follow up on 11/16/2018.   Specialty: Ophthalmology Why: Monday office will call you with an appointment Contact information: [address] [place] [address] [phone]            The results of significant diagnostics from this hospitalization (including imaging, microbiology, ancillary and laboratory) are listed below for reference.    Significant Diagnostic Studies: Ct Angio Head W Or Wo Contrast  Result Date: 11/11/2018 CLINICAL DATA:  Blurred vision of the right eye. Headache, acute, normal neuro exam. Abnormal ophthalmologic exam. EXAM: CT ANGIOGRAPHY HEAD TECHNIQUE: Multidetector CT imaging of the head was performed using the standard protocol during bolus administration of intravenous contrast. Multiplanar CT image reconstructions and MIPs were obtained to evaluate the vascular anatomy. CONTRAST:  34m OMNIPAQUE IOHEXOL 350 MG/ML SOLN COMPARISON:  MRI brain and orbits 11/10/2018 FINDINGS: CT HEAD Brain: CT head without and with contrast is within normal limits. No acute infarct, hemorrhage, or mass lesion is present. No significant white matter lesions are present. The ventricles are of normal size. No significant extraaxial fluid collection is present. Vascular: No hyperdense vessel or unexpected calcification. Skull: Calvarium is intact. No focal lytic or blastic lesions are present. Sinuses: The paranasal sinuses and mastoid air cells are clear. Orbits: The globes and orbits are within normal limits. CTA HEAD Anterior circulation: Internal carotid arteries are within normal limits from the high cervical segments through the ICA termini. A1 and M1 segments are normal. ACA and MCA branch vessels are normal. MCA bifurcations are  within normal limits. Focal vascular abnormality is present in the orbits. The ophthalmic artery is not discretely visualized. Superior ophthalmic veins are normal bilaterally. Posterior circulation: The vertebral arteries are codominant. PICA origin origins are visualized and normal. Basilar artery is normal. Both posterior cerebral arteries originate from the basilar tip. Prominent posterior communicating arteries present on right. PCA branch vessels are normal. Venous sinuses: Dural sinuses are patent. The right transverse sinus is dominant. Straight sinus deep cerebral veins are intact. Cortical veins are unremarkable. Anatomic variants: Prominent right posterior communicating artery. Delayed phase: No pathologic enhancement IMPRESSION: Normal CTA circle-of-Willis without significant proximal stenosis, aneurysm, or branch vessel occlusion. No focal vascular abnormality within the orbits. Electronically Signed   By: CSan MorelleM.D.   On: 11/11/2018 12:23   Dg Chest 2 View  Result Date: 11/12/2018 CLINICAL DATA:  Chronic body aches, fever and headaches. Loss of vision at the right eye. EXAM: CHEST - 2 VIEW COMPARISON:  Chest radiograph performed 04/02/2018 FINDINGS: The lungs are well-aerated and clear. There is no evidence of focal opacification, pleural effusion or pneumothorax. The heart is normal in size; the mediastinal contour is within normal limits. No acute osseous abnormalities are seen. IMPRESSION: No acute cardiopulmonary process seen. Electronically Signed   By: JGarald BaldingM.D.   On: 11/12/2018 19:19   Mr BJeri CosAnd Wo Contrast  Result Date: 11/10/2018 CLINICAL DATA:  Papilledema with new headache that started 7 weeks ago. Blurry vision beginning last week. EXAM: MRI HEAD AND ORBITS WITHOUT AND WITH CONTRAST TECHNIQUE: Multiplanar, multiecho pulse  sequences of the brain and surrounding structures were obtained without and with intravenous contrast. Multiplanar, multiecho pulse  sequences of the orbits and surrounding structures were obtained including fat saturation techniques, before and after intravenous contrast administration. CONTRAST:  5 mL Gadavist COMPARISON:  None. FINDINGS: MRI HEAD FINDINGS BRAIN: There is no acute infarct, acute hemorrhage or extra-axial collection. The midline structures are normal. There is no midline shift or mass effect. The white matter signal is normal for the patient's age. The cerebral and cerebellar volume are age-appropriate. There is no hydrocephalus. Susceptibility-sensitive sequences show no chronic microhemorrhage or superficial siderosis. VASCULAR: The major intracranial arterial and venous sinus flow voids are normal. SKULL AND UPPER CERVICAL SPINE: Calvarial bone marrow signal is normal. There is no skull base mass. The visualized upper cervical spine and soft tissues are normal. MRI ORBITS FINDINGS Orbits: --Globes: Normal. --Bony orbit: Normal. --Preseptal soft tissues: Normal. --Intra- and extraconal orbital fat: Normal. No inflammatory stranding. --Optic nerves: Normal. --Lacrimal glands and fossae: Normal. --Extraocular muscles: Normal. Visualized sinuses:  No fluid levels or advanced mucosal thickening. Soft tissues: Normal. Limited intracranial: Normal. IMPRESSION: Normal brain and orbits. Electronically Signed   By: Ulyses Jarred M.D.   On: 11/10/2018 23:04   Mr Cervical Spine W Wo Contrast  Result Date: 11/11/2018 CLINICAL DATA:  Papilledema and headache EXAM: MRI CERVICAL SPINE WITHOUT AND WITH CONTRAST TECHNIQUE: Multiplanar and multiecho pulse sequences of the cervical spine, to include the craniocervical junction and cervicothoracic junction, were obtained without and with intravenous contrast. CONTRAST:  5 mL Gadavist COMPARISON:  None. FINDINGS: Alignment: Grade 1 anterolisthesis at C4-5. Vertebrae: No fracture, evidence of discitis, or bone lesion. Cord: Normal signal and morphology. Posterior Fossa, vertebral arteries,  paraspinal tissues: Negative. Disc levels: C1-2: Normal. C2-3: No disc herniation or stenosis. C3-4: Left facet hypertrophy. No spinal canal or neural foraminal stenosis. No disc herniation. C4-5: No disc herniation or stenosis.  Mild right facet hypertrophy. C5-6: Left-greater-than-right facet hypertrophy. No spinal canal or neural foraminal stenosis. C6-7: Mild uncovertebral hypertrophy, left-greater-than-right with mild bilateral foraminal narrowing. C7-T1: No disc herniation or stenosis. T1-2: Normal. T2-3: Normal. IMPRESSION: 1. No cervical spinal cord abnormality. 2. Mild multilevel facet arthrosis with mild bilateral C7 neural foraminal narrowing. 3. No cervical spinal canal stenosis. Electronically Signed   By: Ulyses Jarred M.D.   On: 11/11/2018 00:07   Mr [name] PV Contrast  Result Date: 11/10/2018 CLINICAL DATA:  Papilledema with new headache that started 7 weeks ago. Blurry vision beginning last week. EXAM: MRI HEAD AND ORBITS WITHOUT AND WITH CONTRAST TECHNIQUE: Multiplanar, multiecho pulse sequences of the brain and surrounding structures were obtained without and with intravenous contrast. Multiplanar, multiecho pulse sequences of the orbits and surrounding structures were obtained including fat saturation techniques, before and after intravenous contrast administration. CONTRAST:  5 mL Gadavist COMPARISON:  None. FINDINGS: MRI HEAD FINDINGS BRAIN: There is no acute infarct, acute hemorrhage or extra-axial collection. The midline structures are normal. There is no midline shift or mass effect. The white matter signal is normal for the patient's age. The cerebral and cerebellar volume are age-appropriate. There is no hydrocephalus. Susceptibility-sensitive sequences show no chronic microhemorrhage or superficial siderosis. VASCULAR: The major intracranial arterial and venous sinus flow voids are normal. SKULL AND UPPER CERVICAL SPINE: Calvarial bone marrow signal is normal. There is no skull base  mass. The visualized upper cervical spine and soft tissues are normal. MRI ORBITS FINDINGS Orbits: --Globes: Normal. --Bony orbit: Normal. --Preseptal soft tissues: Normal. --Intra-  and extraconal orbital fat: Normal. No inflammatory stranding. --Optic nerves: Normal. --Lacrimal glands and fossae: Normal. --Extraocular muscles: Normal. Visualized sinuses:  No fluid levels or advanced mucosal thickening. Soft tissues: Normal. Limited intracranial: Normal. IMPRESSION: Normal brain and orbits. Electronically Signed   By: Ulyses Jarred M.D.   On: 11/10/2018 23:04    Microbiology: Recent Results (from the past 240 hour(s))  Culture, blood (routine x 2)     Status: None   Collection Time: 11/10/18  6:50 PM   Specimen: BLOOD  Result Value Ref Range Status   Specimen Description BLOOD BLOOD RIGHT FOREARM  Final   Special Requests   Final    BOTTLES DRAWN AEROBIC AND ANAEROBIC Blood Culture results may not be optimal due to an inadequate volume of blood received in culture bottles   Culture   Final    NO GROWTH 5 DAYS Performed at [facility], [place] [address] [place], [place] [address]    Report Status 11/15/2018 FINAL  Final  Culture, blood (routine x 2)     Status: None   Collection Time: 11/10/18  7:10 PM   Specimen: BLOOD  Result Value Ref Range Status   Specimen Description BLOOD LEFT ANTECUBITAL  Final   Special Requests   Final    BOTTLES DRAWN AEROBIC AND ANAEROBIC Blood Culture results may not be optimal due to an inadequate volume of blood received in culture bottles   Culture   Final    NO GROWTH 5 DAYS Performed at [facility], [place] [address] [place], [place] [address]    Report Status 11/15/2018 FINAL  Final  SARS Coronavirus 2 (CEPHEID - Performed in [place] hospital lab), Hosp Order     Status: None   Collection Time: 11/10/18  8:24 PM   Specimen: Nasopharyngeal Swab  Result Value Ref Range Status   SARS Coronavirus 2 NEGATIVE NEGATIVE Final    Comment:  (NOTE) If result is NEGATIVE SARS-CoV-2 target nucleic acids are NOT DETECTED. The SARS-CoV-2 RNA is generally detectable in upper and lower  respiratory specimens during the acute phase of infection. The lowest  concentration of SARS-CoV-2 viral copies this assay can detect is 250  copies / mL. A negative result does not preclude SARS-CoV-2 infection  and should not be used as the sole basis for treatment or other  patient management decisions.  A negative result may occur with  improper specimen collection / handling, submission of specimen other  than nasopharyngeal swab, presence of viral mutation(s) within the  areas targeted by this assay, and inadequate number of viral copies  (<250 copies / mL). A negative result must be combined with clinical  observations, patient history, and epidemiological information. If result is POSITIVE SARS-CoV-2 target nucleic acids are DETECTED. The SARS-CoV-2 RNA is generally detectable in upper and lower  respiratory specimens dur ing the acute phase of infection.  Positive  results are indicative of active infection with SARS-CoV-2.  Clinical  correlation with patient history and other diagnostic information is  necessary to determine patient infection status.  Positive results do  not rule out bacterial infection or co-infection with other viruses. If result is PRESUMPTIVE POSTIVE SARS-CoV-2 nucleic acids MAY BE PRESENT.   A presumptive positive result was obtained on the submitted specimen  and confirmed on repeat testing.  While 2019 novel coronavirus  (SARS-CoV-2) nucleic acids may be present in the submitted sample  additional confirmatory testing may be necessary for epidemiological  and / or clinical  management purposes  to differentiate between  SARS-CoV-2 and other Sarbecovirus currently known to infect humans.  If clinically indicated additional testing with an alternate test  methodology [phone]) is advised. The SARS-CoV-2 RNA is  generally  detectable in upper and lower respiratory sp ecimens during the acute  phase of infection. The expected result is Negative. Fact Sheet for Patients:  StrictlyIdeas.no Fact Sheet for Healthcare Providers: BankingDealers.co.za This test is not yet approved or cleared by the [place] FDA and has been authorized for detection and/or diagnosis of SARS-CoV-2 by FDA under an Emergency Use Authorization (EUA).  This EUA will remain in effect (meaning this test can be used) for the duration of the COVID-19 declaration under Section 564(b)(1) of the Act, 21 U.S.C. section 360bbb-3(b)(1), unless the authorization is terminated or revoked sooner. Performed at [facility], [place] [address] [place], [place] [address]      Labs: Basic Metabolic Panel: No results for input(s): NA, K, CL, CO2, GLUCOSE, BUN, CREATININE, CALCIUM, MG, PHOS in the last 168 hours. Liver Function Tests: No results for input(s): AST, ALT, ALKPHOS, BILITOT, PROT, ALBUMIN in the last 168 hours. No results for input(s): LIPASE, AMYLASE in the last 168 hours. No results for input(s): AMMONIA in the last 168 hours. CBC: No results for input(s): WBC, NEUTROABS, HGB, HCT, MCV, PLT in the last 168 hours. Cardiac Enzymes: No results for input(s): CKTOTAL, CKMB, CKMBINDEX, TROPONINI in the last 168 hours. BNP: BNP (last 3 results) No results for input(s): BNP in the last 8760 hours.  ProBNP (last 3 results) No results for input(s): PROBNP in the last 8760 hours.  CBG: No results for input(s): GLUCAP in the last 168 hours.     Signed:  Domenic Polite MD.  [facility] 11/18/2018, 3:04 PM

## 2018-11-21 LAB — ANTIPHOSPHOLIPID SYNDROME EVAL, BLD
Anticardiolipin IgA: <9 APL U/mL (ref 0–11)
Anticardiolipin IgG: <9 GPL U/mL (ref 0–14)
Anticardiolipin IgM: <9 MPL U/mL (ref 0–12)
DRVVT: 45.4 s (ref 0.0–47.0)
PTT Lupus Anticoagulant: 32.3 s (ref 0.0–51.9)
Phosphatydalserine, IgA: 3 APS IgA (ref 0–20)
Phosphatydalserine, IgG: 3 GPS IgG (ref 0–11)
Phosphatydalserine, IgM: 13 MPS IgM (ref 0–25)

## 2018-11-24 LAB — LYME DISEASE DNA BY PCR(BORRELIA BURG): Lyme Disease(B.burgdorferi)PCR: NEGATIVE

## 2018-12-11 ENCOUNTER — Inpatient Hospital Stay: Payer: BC Managed Care – PPO | Admitting: Family Medicine

## 2018-12-30 ENCOUNTER — Ambulatory Visit (HOSPITAL_BASED_OUTPATIENT_CLINIC_OR_DEPARTMENT_OTHER): Payer: BC Managed Care – PPO | Admitting: Family Medicine

## 2018-12-30 ENCOUNTER — Encounter: Payer: Self-pay | Admitting: Family Medicine

## 2018-12-30 DIAGNOSIS — Z09 Encounter for follow-up examination after completed treatment for conditions other than malignant neoplasm: Secondary | ICD-10-CM | POA: Diagnosis not present

## 2018-12-30 DIAGNOSIS — R5383 Other fatigue: Secondary | ICD-10-CM

## 2018-12-30 DIAGNOSIS — H3093 Unspecified chorioretinal inflammation, bilateral: Secondary | ICD-10-CM

## 2018-12-30 NOTE — Progress Notes (Signed)
Virtual Visit via Telephone Note  I connected with [name] on 12/30/18 at  2:30 PM EDT by telephone and verified that I am speaking with the correct person using two identifiers.   I discussed the limitations, risks, security and privacy concerns of performing an evaluation and management service by telephone and the availability of in person appointments. I also discussed with the patient that there may be a patient responsible charge related to this service. The patient expressed understanding and agreed to proceed.  Patient Location: home Provider Location: [facility] office Others participating in call: none   History of Present Illness:        53 yo female who is new to the practice status post hospitalization in July of this year after having onset of visual changes and not feeling well.  She reports that she still has some issues with decreased vision in the right eye as vision still appears cloudy.  Patient did see an eye specialist, Dr. Katy Fitch, yesterday in follow-up and she states that she has the definitive diagnosis of neuroretinitis.  She is not currently on any medications.  Patient reports no significant past medical history.  Patient states that she generally does not go to the doctor very often but realizes that after the summer she does need to establish with a primary care physician so that she can catch up on health maintenance.  She has looked online at her blood work status post hospitalization.  She was not aware of elevated Epstein-Barr virus antibody however she states that her son actually had mononucleosis at about the same time that she had onset of fatigue and she wonders if this might be the reason for her elevated EBV antibody.  She states that overall she feels well other than having the cloudy vision.  She has had no increased thirst, no urinary frequency and denies any numbness or tingling in her hands or feet.  She denies any headaches or dizziness.  No current muscle  or joint pain.  No abdominal pain-no nausea/vomiting/diarrhea or constipation.  No peripheral edema.  No shortness of breath or cough and no chest pain or palpitations.   Past Medical History:  Diagnosis Date  . Arthritis     Past Surgical History:  Procedure Laterality Date  . ARTERY BIOPSY Right 11/11/2018   Procedure: BIOPSY TEMPORAL ARTERY;  Surgeon: Rosetta Posner, MD;  Location: [facility];  Service: Vascular;  Laterality: Right;  . CESAREAN SECTION      Family History  Problem Relation Age of Onset  . COPD Mother   . Emphysema Mother   . Cancer Father   . Heart disease Maternal Grandfather     Social History   Tobacco Use  . Smoking status: Never Smoker  . Smokeless tobacco: Never Used  Substance Use Topics  . Alcohol use: Yes    Alcohol/week: 2.0 standard drinks    Types: 2 Glasses of wine per week  . Drug use: No     No Known Allergies     Observations/Objective: No vital signs or physical exam conducted as visit was done via telephone  Assessment and Plan: 1. Neuroretinitis of both eyes; Hospital discharge follow-up Continue follow-up with Dr. Katy Fitch regarding neuroretinitis/vision changes-we will place referral just in case this is needed by patient's insurance company.  Notes from patient's hospitalization from 11/09/2020 through 11/13/2020 reviewed and discussed with the patient at today's visit.  Lyme disease test was pending at time of hospital discharge and patient is  aware that this is negative.  2. Fatigue, unspecified type Discussed with patient that she had positive Epstein-Barr IgG antibodies.  Patient was asked to call or return if she has continued issues with fatigue or if she would like referral for further evaluation. She believes that perhaps she contracted mono from her son around the time of her hospitalization.  She reports improvement in fatigue since the time of her hospitalization.  Follow Up Instructions:Return for yearly/annual well exam and as  needed.    I discussed the assessment and treatment plan with the patient. The patient was provided an opportunity to ask questions and all were answered. The patient agreed with the plan and demonstrated an understanding of the instructions.   The patient was advised to call back or seek an in-person evaluation if the symptoms worsen or if the condition fails to improve as anticipated.  I provided 12 minutes of non-face-to-face time during this encounter.   Antony Blackbird, MD

## 2019-01-26 ENCOUNTER — Telehealth: Payer: Self-pay | Admitting: *Deleted

## 2019-01-26 NOTE — Telephone Encounter (Signed)
LMOM

## 2019-01-26 NOTE — Telephone Encounter (Signed)
-----   Message from Antony Blackbird, MD sent at 01/20/2019  5:07 PM EDT ----- Regarding: health maintenance Please call patient at your convenience and see if she would like to be scheduled for mammogram or referral for screening colonoscopy.  Please ask if she would like nurse visit to have influenza immunization or if she has had this done at a local pharmacy or through her workplace and if so, please document in patient's chart.

## 2019-04-28 ENCOUNTER — Telehealth: Payer: Self-pay | Admitting: *Deleted

## 2019-04-28 DIAGNOSIS — Z1231 Encounter for screening mammogram for malignant neoplasm of breast: Secondary | ICD-10-CM

## 2019-04-28 DIAGNOSIS — Z1211 Encounter for screening for malignant neoplasm of colon: Secondary | ICD-10-CM

## 2019-04-28 NOTE — Telephone Encounter (Signed)
Patient verified DOB Patient shared she would like to have referral for mammogram.  Referral was placed for mammogram, patient declined the phone number and stated she would await the call from [facility]. Patient denies any nipple discharge, pain or tenderness. Patient states she would like to complete the FECAL home test. Patient will pick up next week and the order has been placed.  Patient also self reported her flu vaccine which was received at [facility].

## 2019-04-28 NOTE — Telephone Encounter (Signed)
-----   Message from Antony Blackbird, MD sent at 01/20/2019  5:07 PM EDT ----- Regarding: health maintenance Please call patient at your convenience and see if she would like to be scheduled for mammogram or referral for screening colonoscopy.  Please ask if she would like nurse visit to have influenza immunization or if she has had this done at a local pharmacy or through her workplace and if so, please document in patient's chart.

## 2019-07-12 ENCOUNTER — Ambulatory Visit
Admission: RE | Admit: 2019-07-12 | Discharge: 2019-07-12 | Disposition: A | Discharge status: Home / Self Care | Payer: BC Managed Care – PPO | Source: Ambulatory Visit | Attending: Family Medicine | Admitting: Family Medicine

## 2019-07-12 ENCOUNTER — Other Ambulatory Visit: Payer: Self-pay

## 2019-07-12 DIAGNOSIS — Z1231 Encounter for screening mammogram for malignant neoplasm of breast: Secondary | ICD-10-CM

## 2019-07-14 ENCOUNTER — Other Ambulatory Visit: Payer: Self-pay | Admitting: Family Medicine

## 2019-07-14 DIAGNOSIS — R928 Other abnormal and inconclusive findings on diagnostic imaging of breast: Secondary | ICD-10-CM

## 2019-07-19 ENCOUNTER — Ambulatory Visit
Admission: RE | Admit: 2019-07-19 | Discharge: 2019-07-19 | Disposition: A | Discharge status: Home / Self Care | Payer: BC Managed Care – PPO | Source: Ambulatory Visit | Attending: Family Medicine | Admitting: Family Medicine

## 2019-07-19 ENCOUNTER — Other Ambulatory Visit: Payer: Self-pay | Admitting: Family Medicine

## 2019-07-19 ENCOUNTER — Other Ambulatory Visit: Payer: Self-pay

## 2019-07-19 DIAGNOSIS — R928 Other abnormal and inconclusive findings on diagnostic imaging of breast: Secondary | ICD-10-CM

## 2019-07-19 DIAGNOSIS — N6320 Unspecified lump in the left breast, unspecified quadrant: Secondary | ICD-10-CM

## 2019-07-19 NOTE — Progress Notes (Signed)
Radiology will contact patient and ask that she be scheduled for US guided biopsy of left breast mass

## 2019-07-22 ENCOUNTER — Ambulatory Visit
Admission: RE | Admit: 2019-07-22 | Discharge: 2019-07-22 | Disposition: A | Discharge status: Home / Self Care | Payer: BC Managed Care – PPO | Source: Ambulatory Visit | Attending: Family Medicine | Admitting: Family Medicine

## 2019-07-22 ENCOUNTER — Other Ambulatory Visit: Payer: Self-pay

## 2019-07-22 DIAGNOSIS — N6320 Unspecified lump in the left breast, unspecified quadrant: Secondary | ICD-10-CM

## 2019-07-23 ENCOUNTER — Encounter: Payer: Self-pay | Admitting: *Deleted

## 2019-07-23 NOTE — Progress Notes (Signed)
Confirmed [facility] for 07/28/19 at 0815 .  Instructions and contact information given.

## 2019-07-27 ENCOUNTER — Other Ambulatory Visit: Payer: Self-pay | Admitting: *Deleted

## 2019-07-27 DIAGNOSIS — C50412 Malignant neoplasm of upper-outer quadrant of left female breast: Secondary | ICD-10-CM

## 2019-07-27 DIAGNOSIS — Z171 Estrogen receptor negative status [ER-]: Secondary | ICD-10-CM | POA: Insufficient documentation

## 2019-07-27 NOTE — Progress Notes (Signed)
[facility] CONSULT NOTE  Patient Care Team: Patient, No Pcp Per as PCP - General (General Practice) Mauro Kaufmann, RN as Oncology Nurse Navigator Rockwell Germany, RN as Oncology Nurse Navigator Rolm Bookbinder, MD as Consulting Physician (General Surgery) Nicholas Lose, MD as Consulting Physician (Hematology and Oncology) Kyung Rudd, MD as Consulting Physician (Radiation Oncology)  CHIEF COMPLAINTS/PURPOSE OF CONSULTATION:  Newly diagnosed breast cancer  HISTORY OF PRESENTING ILLNESS:  [name] 54 y.o. female is here because of recent diagnosis of invasive ductal carcinoma of the left breast. Screening mammogram on 07/12/19 showed a left breast asymmetry. Diagnostic mammogram and [place] on 07/19/19 showed a 1.2cm mass in the left breast at the 12:30 position and no left axillary adenopathy. Biopsy on 07/22/19 showed invasive ductal carcinoma with DCIS, grade 2, HER-2 positive (3+), ER/PR negative, Ki67 90%. She presents to the clinic today for initial evaluation and discussion of treatment options.   I reviewed her records extensively and collaborated the history with the patient.  SUMMARY OF ONCOLOGIC HISTORY: Oncology History  Malignant neoplasm of upper-outer quadrant of left breast in female, estrogen receptor negative ([place])  07/27/2019 Initial Diagnosis   Screening mammogram showed a left breast asymmetry. Diagnostic mammogram and US showed a 1.2cm left breast mass, 12:30 position, no left axillary adenopathy. 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     MEDICAL HISTORY:  Past Medical History:  Diagnosis Date  . Arthritis     SURGICAL HISTORY: Past Surgical History:  Procedure Laterality Date  . ARTERY BIOPSY Right 11/11/2018   Procedure: BIOPSY TEMPORAL ARTERY;  Surgeon: Rosetta Posner, MD;  Location: [facility];  Service: Vascular;  Laterality: Right;  . CESAREAN SECTION      SOCIAL HISTORY: Social History   Socioeconomic  History  . Marital status: Single    Spouse name: Not on file  . Number of children: Not on file  . Years of education: Not on file  . Highest education level: Not on file  Occupational History  . Not on file  Tobacco Use  . Smoking status: Never Smoker  . Smokeless tobacco: Never Used  Substance and Sexual Activity  . Alcohol use: Yes    Alcohol/week: 2.0 standard drinks    Types: 2 Glasses of wine per week  . Drug use: No  . Sexual activity: Yes    Birth control/protection: Condom  Other Topics Concern  . Not on file  Social History Narrative  . Not on file   Social Determinants of Health   Financial Resource Strain:   . Difficulty of Paying Living Expenses:   Food Insecurity:   . Worried About Charity fundraiser in the Last Year:   . Arboriculturist in the Last Year:   Transportation Needs:   . Film/video editor (Medical):   Marland Kitchen Lack of Transportation (Non-Medical):   Physical Activity:   . Days of Exercise per Week:   . Minutes of Exercise per Session:   Stress:   . Feeling of Stress :   Social Connections:   . Frequency of Communication with Friends and Family:   . Frequency of Social Gatherings with Friends and Family:   . Attends Religious Services:   . Active Member of Clubs or Organizations:   . Attends Archivist Meetings:   Marland Kitchen Marital Status:   Intimate Partner Violence:   . Fear of Current or Ex-Partner:   .  Emotionally Abused:   Marland Kitchen Physically Abused:   . Sexually Abused:     FAMILY HISTORY: Family History  Problem Relation Age of Onset  . COPD Mother   . Emphysema Mother   . Cancer Father   . Heart disease Maternal Grandfather     ALLERGIES:  has No Known Allergies.  MEDICATIONS:  Current Outpatient Medications  Medication Sig Dispense Refill  . Multiple Vitamins-Minerals (CENTRUM SILVER 50+WOMEN PO) Take 1 tablet by mouth daily.    Marland Kitchen ibuprofen (ADVIL) 200 MG tablet Take 600 mg by mouth every 6 (six) hours as needed for  moderate pain.     No current facility-administered medications for this visit.    REVIEW OF SYSTEMS:   Constitutional: Denies fevers, chills or abnormal night sweats Eyes: Denies blurriness of vision, double vision or watery eyes Ears, nose, mouth, throat, and face: Denies mucositis or sore throat Respiratory: Denies cough, dyspnea or wheezes Cardiovascular: Denies palpitation, chest discomfort or lower extremity swelling Gastrointestinal:  Denies nausea, heartburn or change in bowel habits Skin: Denies abnormal skin rashes Lymphatics: Denies new lymphadenopathy or easy bruising Neurological:Denies numbness, tingling or new weaknesses Behavioral/Psych: Mood is stable, no new changes  Breast: Denies any palpable lumps or discharge All other systems were reviewed with the patient and are negative.  PHYSICAL EXAMINATION: ECOG PERFORMANCE STATUS: 1 - Symptomatic but completely ambulatory  Vitals:   07/28/19 0917  BP: (!) 141/70  Pulse: 92  Resp: 18  Temp: 98.3 F (36.8 C)  SpO2: 99%   Filed Weights   07/28/19 0917  Weight: 154 lb 6.4 oz (70 kg)    GENERAL:alert, no distress and comfortable SKIN: skin color, texture, turgor are normal, no rashes or significant lesions EYES: normal, conjunctiva are pink and non-injected, sclera clear OROPHARYNX:no exudate, no erythema and lips, buccal mucosa, and tongue normal  NECK: supple, thyroid normal size, non-tender, without nodularity LYMPH:  no palpable lymphadenopathy in the cervical, axillary or inguinal LUNGS: clear to auscultation and percussion with normal breathing effort HEART: regular rate & rhythm and no murmurs and no lower extremity edema ABDOMEN:abdomen soft, non-tender and normal bowel sounds Musculoskeletal:no cyanosis of digits and no clubbing  PSYCH: alert & oriented x 3 with fluent speech NEURO: no focal motor/sensory deficits    LABORATORY DATA:  I have reviewed the data as listed Lab Results  Component Value  Date   WBC 5.1 07/28/2019   HGB 14.5 07/28/2019   HCT 43.1 07/28/2019   MCV 94.1 07/28/2019   PLT 254 07/28/2019   Lab Results  Component Value Date   NA 144 07/28/2019   K 4.6 07/28/2019   CL 109 07/28/2019   CO2 27 07/28/2019    RADIOGRAPHIC STUDIES: I have personally reviewed the radiological reports and agreed with the findings in the report.  ASSESSMENT AND PLAN:  Malignant neoplasm of upper-outer quadrant of left breast in female, estrogen receptor negative ([place]) 07/27/2019:Screening mammogram showed a left breast asymmetry. Diagnostic mammogram and US showed a 1.2cm left breast mass, 12:30 position, no left axillary adenopathy. 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 T1c N0 stage Ia clinical stage  Pathology and radiology counseling: Discussed with the patient, the details of pathology including the type of breast cancer,the clinical staging, the significance of ER, PR and HER-2/neu receptors and the implications for treatment. After reviewing the pathology in detail, we proceeded to discuss the different treatment options between surgery, radiation, chemotherapy, antiestrogen therapies.  Recommendation: 1.  Breast conserving surgery with sentinel lymph node biopsy 2.  Adjuvant chemotherapy with Taxol Herceptin followed by Herceptin maintenance for 1 year 3.  Adjuvant radiation therapy  Chemo counseling: Discussed risks and benefits of Taxol and Herceptin including risk of neuropathy, cytopenias, allergic reactions, Herceptin related decrease in cardiac ejection fraction.  Return to clinic after surgery to discuss pathology report and discuss starting anti-HER-2 based therapy.   All questions were answered. The patient knows to call the clinic with any problems, questions or concerns.   Rulon Eisenmenger, MD, MPH 07/28/2019    I, Molly Dorshimer, am acting as scribe for Nicholas Lose, MD.  I have reviewed the above documentation for accuracy and  completeness, and I agree with the above.

## 2019-07-27 NOTE — Progress Notes (Signed)
Radiation Oncology         [phone] ________________________________  Name: [name]        MRN: [id number]  Date of Service: 07/28/2019 DOB: Aug 20, 1965  HE:NIDPOEU, No Pcp Per  Dr. Donne Hazel  REFERRING PHYSICIAN: Dr. Donne Hazel  DIAGNOSIS: The encounter diagnosis was Malignant neoplasm of upper-outer quadrant of left breast in female, estrogen receptor negative ([place]).   HISTORY OF PRESENT ILLNESS: [name] is a 54 y.o. female seen in the multidisciplinary breast clinic for a new diagnosis of left breast cancer. The patient was noted to have a screening detected abnormality in the left breast, and a possible asymmetry, and no abnormal findings in the right breast on recent mammography.  Diagnostic mammogram revealed a persistence of a 1.4 cm mass in the upper outer quadrant of the left breast.  Targeted ultrasound revealed a mass at 1232 cm from the nipple measuring 1.2 x 0.8 x 1.1 cm.  Her axilla was negative for adenopathy.  She underwent a biopsy on 07/22/2018 one of the mass at 1230 o'clock in the left breast revealing an invasive ductal carcinoma, at least grade 2, and her tumor was ER/PR negative, HER-2 amplified with a Ki-67 of 90%.  She is seen today to discuss treatment recommendations for her cancer.     PREVIOUS RADIATION THERAPY: No   PAST MEDICAL HISTORY:  Past Medical History:  Diagnosis Date  . Arthritis        PAST SURGICAL HISTORY: Past Surgical History:  Procedure Laterality Date  . ARTERY BIOPSY Right 11/11/2018   Procedure: BIOPSY TEMPORAL ARTERY;  Surgeon: Rosetta Posner, MD;  Location: [facility];  Service: Vascular;  Laterality: Right;  . CESAREAN SECTION       FAMILY HISTORY:  Family History  Problem Relation Age of Onset  . COPD Mother   . Emphysema Mother   . Cancer Father   . Heart disease Maternal Grandfather      SOCIAL HISTORY:  reports that she has never smoked. She has never used smokeless tobacco. She reports current alcohol use of  about 2.0 standard drinks of alcohol per week. She reports that she does not use drugs. The patient is married and lives in [place]. 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 She has two teenage children.   ALLERGIES: Patient has no known allergies.   MEDICATIONS:  Current Outpatient Medications  Medication Sig Dispense Refill  . ibuprofen (ADVIL) 200 MG tablet Take 600 mg by mouth every 6 (six) hours as needed for moderate pain.    . Multiple Vitamins-Minerals (CENTRUM SILVER 50+WOMEN PO) Take 1 tablet by mouth daily.     No current facility-administered medications for this encounter.     REVIEW OF SYSTEMS: On review of systems, the patient reports that she is doing well overall. She denies any chest pain, shortness of breath, cough, fevers, chills, night sweats, unintended weight changes. She denies any bowel or bladder disturbances, and denies abdominal pain, nausea or vomiting. She denies any new musculoskeletal or joint aches or pains. A complete review of systems is obtained and is otherwise negative.     PHYSICAL EXAM:  Wt Readings from Last 3 Encounters:  07/28/19 154 lb 6.4 oz (70 kg)  11/11/18 148 lb 5.9 oz (67.3 kg)  04/02/18 150 lb (68 kg)   Temp Readings from Last 3 Encounters:  07/28/19 98.3 F (36.8 C) (Temporal)  11/14/18 97.9 F (36.6 C)  04/02/18 98.5 F (36.9 C) (Oral)  BP Readings from Last 3 Encounters:  07/28/19 (!) 141/70  11/14/18 134/86  04/02/18 (!) 136/92   Pulse Readings from Last 3 Encounters:  07/28/19 92  11/14/18 91  04/02/18 93    In general this is a well appearing Caucasian female in no acute distress. She's alert and oriented x4 and appropriate throughout the examination. Cardiopulmonary assessment is negative for acute distress and she exhibits normal effort. Bilateral breast exam is deferred.    ECOG = 0  0 - Asymptomatic (Fully active, able to carry on all predisease activities without  restriction)  1 - Symptomatic but completely ambulatory (Restricted in physically strenuous activity but ambulatory and able to carry out work of a light or sedentary nature. For example, light housework, office work)  2 - Symptomatic, <50% in bed during the day (Ambulatory and capable of all self care but unable to carry out any work activities. Up and about more than 50% of waking hours)  3 - Symptomatic, >50% in bed, but not bedbound (Capable of only limited self-care, confined to bed or chair 50% or more of waking hours)  4 - Bedbound (Completely disabled. Cannot carry on any self-care. Totally confined to bed or chair)  5 - Death   Eustace Pen MM, Creech RH, Tormey DC, et al. [phone]). "Toxicity and response criteria of the [facility]". [place] Oncol. 5 (6): 649-55    LABORATORY DATA:  Lab Results  Component Value Date   WBC 5.1 07/28/2019   HGB 14.5 07/28/2019   HCT 43.1 07/28/2019   MCV 94.1 07/28/2019   PLT 254 07/28/2019   Lab Results  Component Value Date   NA 144 07/28/2019   K 4.6 07/28/2019   CL 109 07/28/2019   CO2 27 07/28/2019   Lab Results  Component Value Date   ALT 17 07/28/2019   AST 15 07/28/2019   ALKPHOS 87 07/28/2019   BILITOT 0.5 07/28/2019      RADIOGRAPHY: US BREAST LTD UNI LEFT INC AXILLA  Result Date: 07/19/2019 CLINICAL DATA:  Patient was called back from screening mammogram for a possible asymmetry in the left breast. EXAM: DIGITAL DIAGNOSTIC LEFT MAMMOGRAM WITH CAD AND TOMO ULTRASOUND LEFT BREAST COMPARISON:  Previous exam(s). ACR Breast Density Category b: There are scattered areas of fibroglandular density. FINDINGS: Additional imaging of the left breast was performed. There is persistence of a 1.4 cm mass in the upper-outer quadrant of the left breast. There are several punctate calcifications associated with the mass. Mammographic images were processed with CAD. Targeted ultrasound is performed, showing there is an  irregular hypoechoic mass in the left breast at 12:30 2 cm from the nipple measuring 1.2 x 0.8 x 1.1 cm. Sonographic evaluation of the left axilla does not show any enlarged adenopathy. IMPRESSION: Suspicious left breast mass and calcifications. RECOMMENDATION: Ultrasound-guided core biopsy of the mass in the 12:30 region of the left breast is recommended. The biopsy will be scheduled the patient's convenience. I have discussed the findings and recommendations with the patient. If applicable, a reminder letter will be sent to the patient regarding the next appointment. BI-RADS CATEGORY  5: Highly suggestive of malignancy. Electronically Signed   By: Lillia Mountain M.D.   On: 07/19/2019 09:16   MM DIAG BREAST TOMO UNI LEFT  Result Date: 07/19/2019 CLINICAL DATA:  Patient was called back from screening mammogram for a possible asymmetry in the left breast. EXAM: DIGITAL DIAGNOSTIC LEFT MAMMOGRAM WITH CAD AND TOMO ULTRASOUND LEFT BREAST COMPARISON:  Previous exam(s). ACR Breast Density Category b: There are scattered areas of fibroglandular density. FINDINGS: Additional imaging of the left breast was performed. There is persistence of a 1.4 cm mass in the upper-outer quadrant of the left breast. There are several punctate calcifications associated with the mass. Mammographic images were processed with CAD. Targeted ultrasound is performed, showing there is an irregular hypoechoic mass in the left breast at 12:30 2 cm from the nipple measuring 1.2 x 0.8 x 1.1 cm. Sonographic evaluation of the left axilla does not show any enlarged adenopathy. IMPRESSION: Suspicious left breast mass and calcifications. RECOMMENDATION: Ultrasound-guided core biopsy of the mass in the 12:30 region of the left breast is recommended. The biopsy will be scheduled the patient's convenience. I have discussed the findings and recommendations with the patient. If applicable, a reminder letter will be sent to the patient regarding the next  appointment. BI-RADS CATEGORY  5: Highly suggestive of malignancy. Electronically Signed   By: Lillia Mountain M.D.   On: 07/19/2019 09:16   MM 3D SCREEN BREAST BILATERAL  Result Date: 07/13/2019 CLINICAL DATA:  Screening. EXAM: DIGITAL SCREENING BILATERAL MAMMOGRAM WITH TOMO AND CAD COMPARISON:  Previous exam(s). ACR Breast Density Category b: There are scattered areas of fibroglandular density. FINDINGS: In the left breast, a possible asymmetry warrants further evaluation. In the right breast, no findings suspicious for malignancy. Images were processed with CAD. IMPRESSION: Further evaluation is suggested for possible asymmetry in the left breast. RECOMMENDATION: Diagnostic mammogram and possibly ultrasound of the left breast. (Code:[id number]) The patient will be contacted regarding the findings, and additional imaging will be scheduled. BI-RADS CATEGORY  0: Incomplete. Need additional imaging evaluation and/or prior mammograms for comparison. Electronically Signed   By: Nolon Nations M.D.   On: 07/13/2019 09:44   MM CLIP PLACEMENT LEFT  Result Date: 07/22/2019 CLINICAL DATA:  Status post ultrasound-guided core needle biopsy a 1.2 cm mass in the 12:30 o'clock position of the breast. EXAM: DIAGNOSTIC LEFT MAMMOGRAM POST ULTRASOUND BIOPSY COMPARISON:  Previous exam(s). FINDINGS: Mammographic images were obtained following ultrasound guided biopsy of the recently demonstrated 1.2 cm mass in the 12:30 o'clock position of the breast. The biopsy marking clip is in expected position at the site of biopsy. IMPRESSION: Appropriate positioning of the ribbon shaped biopsy marking clip at the site of biopsy in the biopsied mass in the 12:30 o'clock position of the left breast. Final Assessment: Post Procedure Mammograms for Marker Placement Electronically Signed   By: Claudie Revering M.D.   On: 07/22/2019 08:18   [place] LT BREAST BX W LOC DEV 1ST LESION IMG BX SPEC US GUIDE  Addendum Date: 07/23/2019   ADDENDUM REPORT:  07/23/2019 12:55 ADDENDUM: Pathology revealed GRADE II INVASIVE DUCTAL CARCINOMA, DUCTAL CARCINOMA IN SITU WITH CALCIFICATIONS of the LEFT breast, 12:30 o'clock. This was found to be concordant by Dr. Claudie Revering. Pathology results were discussed with the patient by telephone. The patient reported doing well after the biopsy with tenderness at the site. Post biopsy instructions and care were reviewed and questions were answered. The patient was encouraged to call [facility] for any additional concerns. The patient was referred to [facility] on July 28, 2019. Pathology results reported by Stacie Acres RN on 07/23/2019. Electronically Signed   By: Claudie Revering M.D.   On: 07/23/2019 12:55   Result Date: 07/23/2019 CLINICAL DATA:  1.2 cm mass suspicious for malignancy in the  12:30 o'clock position of left breast at recent mammography and ultrasound. EXAM: ULTRASOUND GUIDED LEFT BREAST CORE NEEDLE BIOPSY COMPARISON:  Previous exam(s). PROCEDURE: I met with the patient and we discussed the procedure of ultrasound-guided biopsy, including benefits and alternatives. We discussed the high likelihood of a successful procedure. We discussed the risks of the procedure, including infection, bleeding, tissue injury, clip migration, and inadequate sampling. Informed written consent was given. The usual time-out protocol was performed immediately prior to the procedure. Lesion quadrant: Upper outer quadrant Using sterile technique and 1% Lidocaine as local anesthetic, under direct ultrasound visualization, a 12 gauge spring-loaded device was used to perform biopsy of the recently demonstrated 1.2 cm mass in the 12:30 o'clock position of the left breast, 2 cm from the nipple, using an inferolateral approach. At the conclusion of the procedure a ribbon shaped tissue marker clip was deployed into the biopsy cavity. Follow up 2 view  mammogram was performed and dictated separately. IMPRESSION: Ultrasound guided biopsy of the recently demonstrated 1.2 cm mass in the 12:30 o'clock position of breast. No apparent complications. Electronically Signed: By: Claudie Revering M.D. On: 07/22/2019 08:10       IMPRESSION/PLAN: 1. Stage IA, [id number] HER-2 amplified grade 2 invasive ductal carcinoma of the left breast. Dr. Lisbeth Renshaw discusses the pathology findings and reviews the nature of invasive left breast disease. The consensus from the breast conference includes breast conservation with lumpectomy with sentinel node biopsy. She is being offered systemic chemotherapy with targeted HER2 therapy, and she would benefit from adjuvant radiotherapy to reduce risks of local recurrence. We discussed the risks, benefits, short, and long term effects of radiotherapy, and the patient is interested in proceeding. Dr. Lisbeth Renshaw discusses the delivery and logistics of radiotherapy and anticipates a course of 4-6 1/2 weeks of radiotherapy with deep inspiration breath hold technique. We will see her back about 2-3 weeks after chemotherapy.    In a visit lasting 45 minutes, greater than 50% of the time was spent face to face reviewing her case, as well as in preparation of, discussing, and coordinating the patient's care.  The above documentation reflects my direct findings during this shared patient visit. Please see the separate note by Dr. Lisbeth Renshaw on this date for the remainder of the patient's plan of care.    Carola Rhine, PAC

## 2019-07-28 ENCOUNTER — Ambulatory Visit
Admission: RE | Admit: 2019-07-28 | Discharge: 2019-07-28 | Disposition: A | Discharge status: Home / Self Care | Payer: BC Managed Care – PPO | Source: Ambulatory Visit | Attending: Radiation Oncology | Admitting: Radiation Oncology

## 2019-07-28 ENCOUNTER — Ambulatory Visit: Payer: BC Managed Care – PPO | Attending: General Surgery | Admitting: Physical Therapy

## 2019-07-28 ENCOUNTER — Encounter: Payer: Self-pay | Admitting: *Deleted

## 2019-07-28 ENCOUNTER — Inpatient Hospital Stay: Payer: BC Managed Care – PPO

## 2019-07-28 ENCOUNTER — Inpatient Hospital Stay: Payer: BC Managed Care – PPO | Attending: Hematology and Oncology | Admitting: Hematology and Oncology

## 2019-07-28 ENCOUNTER — Other Ambulatory Visit: Payer: Self-pay

## 2019-07-28 ENCOUNTER — Encounter: Payer: Self-pay | Admitting: Physical Therapy

## 2019-07-28 ENCOUNTER — Other Ambulatory Visit: Payer: Self-pay | Admitting: General Surgery

## 2019-07-28 DIAGNOSIS — M1990 Unspecified osteoarthritis, unspecified site: Secondary | ICD-10-CM | POA: Diagnosis not present

## 2019-07-28 DIAGNOSIS — Z171 Estrogen receptor negative status [ER-]: Secondary | ICD-10-CM

## 2019-07-28 DIAGNOSIS — C50412 Malignant neoplasm of upper-outer quadrant of left female breast: Secondary | ICD-10-CM | POA: Insufficient documentation

## 2019-07-28 DIAGNOSIS — R293 Abnormal posture: Secondary | ICD-10-CM | POA: Diagnosis present

## 2019-07-28 LAB — CMP (CANCER CENTER ONLY)
ALT: 17 U/L (ref 0–44)
AST: 15 U/L (ref 15–41)
Albumin: 3.8 g/dL (ref 3.5–5.0)
Alkaline Phosphatase: 87 U/L (ref 38–126)
Anion gap: 8 (ref 5–15)
BUN: 13 mg/dL (ref 6–20)
CO2: 27 mmol/L (ref 22–32)
Calcium: 9.3 mg/dL (ref 8.9–10.3)
Chloride: 109 mmol/L (ref 98–111)
Creatinine: 0.76 mg/dL (ref 0.44–1.00)
GFR, Est AFR Am: >60 mL/min (ref >60)
GFR, Estimated: >60 mL/min (ref >60)
Glucose, Bld: 97 mg/dL (ref 70–99)
Potassium: 4.6 mmol/L (ref 3.5–5.1)
Sodium: 144 mmol/L (ref 135–145)
Total Bilirubin: 0.5 mg/dL (ref 0.3–1.2)
Total Protein: 7.1 g/dL (ref 6.5–8.1)

## 2019-07-28 LAB — CBC WITH DIFFERENTIAL (CANCER CENTER ONLY)
Abs Immature Granulocytes: 0.01 10*3/uL (ref 0.00–0.07)
Basophils Absolute: 0 10*3/uL (ref 0.0–0.1)
Basophils Relative: 0 %
Eosinophils Absolute: 0.1 10*3/uL (ref 0.0–0.5)
Eosinophils Relative: 2 %
HCT: 43.1 % (ref 36.0–46.0)
Hemoglobin: 14.5 g/dL (ref 12.0–15.0)
Immature Granulocytes: 0 %
Lymphocytes Relative: 34 %
Lymphs Abs: 1.8 10*3/uL (ref 0.7–4.0)
MCH: 31.7 pg (ref 26.0–34.0)
MCHC: 33.6 g/dL (ref 30.0–36.0)
MCV: 94.1 fL (ref 80.0–100.0)
Monocytes Absolute: 0.5 10*3/uL (ref 0.1–1.0)
Monocytes Relative: 9 %
Neutro Abs: 2.8 10*3/uL (ref 1.7–7.7)
Neutrophils Relative %: 55 %
Platelet Count: 254 10*3/uL (ref 150–400)
RBC: 4.58 MIL/uL (ref 3.87–5.11)
RDW: 11.4 % — ABNORMAL LOW (ref 11.5–15.5)
WBC Count: 5.1 10*3/uL (ref 4.0–10.5)
nRBC: 0 % (ref 0.0–0.2)

## 2019-07-28 LAB — GENETIC SCREENING ORDER

## 2019-07-28 NOTE — Patient Instructions (Signed)

## 2019-07-28 NOTE — Assessment & Plan Note (Signed)
07/27/2019:Screening mammogram showed a left breast asymmetry. Diagnostic mammogram and US showed a 1.2cm left breast mass, 12:30 position, no left axillary adenopathy. 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 T1c N0 stage Ia clinical stage  Pathology and radiology counseling: Discussed with the patient, the details of pathology including the type of breast cancer,the clinical staging, the significance of ER, PR and HER-2/neu receptors and the implications for treatment. After reviewing the pathology in detail, we proceeded to discuss the different treatment options between surgery, radiation, chemotherapy, antiestrogen therapies.  Recommendation: 1.  Breast conserving surgery with sentinel lymph node biopsy 2.  Adjuvant chemotherapy with Taxol Herceptin followed by Herceptin maintenance for 1 year 3.  Adjuvant radiation therapy  Return to clinic after surgery to discuss pathology report and discuss starting anti-HER-2 based therapy.

## 2019-07-28 NOTE — Therapy (Signed)
[place], [place], [address] Phone: [phone]   Fax:  [phone]  Physical Therapy Evaluation  Patient Details  Name: [name] MRN: [id number] Date of Birth: Dec 23, 1965 Referring Provider (PT): Dr. Rolm Bookbinder   Encounter Date: 07/28/2019  PT End of Session - 07/28/19 1326    Visit Number  1    Number of Visits  2    Date for PT Re-Evaluation  09/22/19    PT Start Time  9169    PT Stop Time  1039    PT Time Calculation (min)  24 min    Activity Tolerance  Patient tolerated treatment well    Behavior During Therapy  [facility] for tasks assessed/performed       Past Medical History:  Diagnosis Date  . Arthritis     Past Surgical History:  Procedure Laterality Date  . ARTERY BIOPSY Right 11/11/2018   Procedure: BIOPSY TEMPORAL ARTERY;  Surgeon: Rosetta Posner, MD;  Location: [facility];  Service: Vascular;  Laterality: Right;  . CESAREAN SECTION      There were no vitals filed for this visit.   Subjective Assessment - 07/28/19 1237    Subjective  Patient reports she is here today to be seen by her medical team for her newly diagnosed left breast cancer.    Pertinent History  Patient was diagnosed on 07/12/2019 with left grade II invasive ductal carcinoma breast cancer. It measures 1.2 cm and is located in the upper outer quadrant. It is ER/PR negative and HER2 positive with a Ki67 of 90%.    Patient Stated Goals  reduce lymphedema risk and learn post op shoulder ROM HEP    Currently in Pain?  Yes    Pain Score  5     Pain Location  Back    Pain Orientation  Lower    Pain Descriptors / Indicators  Aching    Pain Type  Chronic pain    Pain Onset  More than a month ago    Pain Frequency  Intermittent    Aggravating Factors   Unknown    Pain Relieving Factors  Unknown    Multiple Pain Sites  No         [facility] PT Assessment - 07/28/19 0001      Assessment   Medical Diagnosis  Left  breast cancer    Referring Provider (PT)  Dr. Rolm Bookbinder    Onset Date/Surgical Date  07/12/19    Hand Dominance  Right    Prior Therapy  none      Precautions   Precautions  Other (comment)    Precaution Comments  active cancer      Restrictions   Weight Bearing Restrictions  No      Balance Screen   Has the patient fallen in the past 6 months  No    Has the patient had a decrease in activity level because of a fear of falling?   No    Is the patient reluctant to leave their home because of a fear of falling?   No      Home Environment   Living Environment  Private residence    Living Arrangements  Children;Spouse/significant other   Husband, 33 and 6 y.o. 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[employer]    Leisure  She walks once a week and plays tennis once a month      Cognition   Overall Cognitive Status  Within Functional Limits for tasks assessed      Posture/Postural Control   Posture/Postural Control  Postural limitations    Postural Limitations  Rounded Shoulders;Forward head      ROM / Strength   AROM / PROM / Strength  AROM;Strength      AROM   Overall AROM Comments  Cervical AROM is WNL    AROM Assessment Site  Shoulder    Right/Left Shoulder  Right;Left    Right Shoulder Extension  49 Degrees    Right Shoulder Flexion  140 Degrees    Right Shoulder ABduction  158 Degrees    Right Shoulder Internal Rotation  66 Degrees    Right Shoulder External Rotation  79 Degrees    Left Shoulder Extension  57 Degrees    Left Shoulder Flexion  140 Degrees    Left Shoulder ABduction  157 Degrees    Left Shoulder Internal Rotation  65 Degrees    Left Shoulder External Rotation  72 Degrees      Strength   Overall Strength  Within functional limits for tasks performed        LYMPHEDEMA/ONCOLOGY  QUESTIONNAIRE - 07/28/19 1324      Type   Cancer Type  Left breast cancer      Lymphedema Assessments   Lymphedema Assessments  Upper extremities      Right Upper Extremity Lymphedema   10 cm Proximal to Olecranon Process  28 cm    Olecranon Process  24.7 cm    10 cm Proximal to Ulnar Styloid Process  24.1 cm    Just Proximal to Ulnar Styloid Process  15.4 cm    Across Hand at [employer]  17.8 cm    At [place] of 2nd Digit  6.1 cm      Left Upper Extremity Lymphedema   10 cm Proximal to Olecranon Process  27 cm    Olecranon Process  24.1 cm    10 cm Proximal to Ulnar Styloid Process  23 cm    Just Proximal to Ulnar Styloid Process  15.2 cm    Across Hand at [employer]  17.2 cm    At [place] of 2nd Digit  5.9 cm          Quick Dash - 07/28/19 0001    Open a tight or new jar  Moderate difficulty    Do heavy household chores (wash walls, wash floors)  No difficulty    Carry a shopping bag or briefcase  No difficulty    Wash your back  No difficulty    Use a knife to cut food  No difficulty    Recreational activities in which you take some force or impact through your arm, shoulder, or hand (golf, hammering, tennis)  No difficulty    During the past week, to what extent has your arm, shoulder or hand problem interfered with your normal social activities with family, friends, neighbors, or groups?  Not at all    During the past week, to what extent has your arm, shoulder or hand problem limited your work or other regular daily activities  Not at all    Arm, shoulder, or hand pain.  None    Tingling (pins and needles) in your arm, shoulder, or hand  None  Difficulty Sleeping  No difficulty    DASH Score  4.55 %        Objective measurements completed on examination: See above findings.      Patient was instructed today in a home exercise program today for post op shoulder range of motion. These included active assist shoulder flexion in sitting, scapular retraction,  wall walking with shoulder abduction, and hands behind head external rotation.  She was encouraged to do these twice a day, holding 3 seconds and repeating 5 times when permitted by her physician.            PT Education - 07/28/19 1325    Education Details  Lymphedema risk reduction and post op shoulder ROM HEP    Person(s) Educated  Patient    Methods  Explanation;Demonstration;Handout    Comprehension  Returned demonstration;Verbalized understanding          PT Long Term Goals - 07/28/19 1331      PT LONG TERM GOAL #1   Title  Patient will demonstrate she has regained full shoulder ROM and function post operatively compared to baselines.    Time  8    Period  Weeks    Status  New    Target Date  09/22/19      Breast Clinic Goals - 07/28/19 1331      Patient will be able to verbalize understanding of pertinent lymphedema risk reduction practices relevant to her diagnosis specifically related to skin care.   Time  1    Period  Days    Status  Achieved      Patient will be able to return demonstrate and/or verbalize understanding of the post-op home exercise program related to regaining shoulder range of motion.   Time  1    Period  Days    Status  Achieved      Patient will be able to verbalize understanding of the importance of attending the postoperative After Breast Cancer Class for further lymphedema risk reduction education and therapeutic exercise.   Time  1    Period  Days    Status  Achieved            Plan - 07/28/19 1327    Clinical Impression Statement  Patient was diagnosed on 07/12/2019 with left grade II invasive ductal carcinoma breast cancer. It measures 1.2 cm and is located in the upper outer quadrant. It is ER/PR negative and HER2 positive with a Ki67 of 90%. Her multidisciplainry medical team met prior to her assessments to determine a recommended treatment plan. She is planning to have a left lumpectomy and sentinel node biopsy followed  by chemotherapy and radiation. She will benefit from a post op PT reassessment to determine needs and 3 month L-Dex screenings to prevent clinically significant lymphedema.    Stability/Clinical Decision Making  Stable/Uncomplicated    Clinical Decision Making  Low    Rehab Potential  Excellent    PT Frequency  --   Eval and 1 f/u visit   PT Treatment/Interventions  ADLs/Self Care Home Management;Therapeutic exercise;Patient/family education    PT Next Visit Plan  Will reassess 3-4 weeks post op to determine needs and then see every 3 months for L-Dex screenings    PT Home Exercise Plan  Post op shoulder ROM HEP    Consulted and Agree with Plan of Care  Patient;Other (Comment)   friend      Patient will benefit from skilled therapeutic intervention in order to improve  the following deficits and impairments:  Postural dysfunction, Decreased range of motion, Pain, Impaired UE functional use, Decreased knowledge of precautions  Visit Diagnosis: Malignant neoplasm of upper-outer quadrant of left breast in female, estrogen receptor negative ([place]) - Plan: PT plan of care cert/re-cert  Abnormal posture - Plan: PT plan of care cert/re-cert   Patient will follow up at outpatient cancer rehab 3-4 weeks following surgery.  If the patient requires physical therapy at that time, a specific plan will be dictated and sent to the referring physician for approval. The patient was educated today on appropriate basic range of motion exercises to begin post operatively and the importance of attending the After Breast Cancer class following surgery.  Patient was educated today on lymphedema risk reduction practices as it pertains to recommendations that will benefit the patient immediately following surgery.  She verbalized good understanding.    The patient was assessed using the L-Dex machine today to produce a lymphedema index baseline score. The patient will be reassessed on a regular basis (typically every 3  months) to obtain new L-Dex scores. If the score is > 6.5 points away from his/her baseline score indicating onset of subclinical lymphedema, it will be recommended to wear a compression garment for 4 weeks, 12 hours per day and then be reassessed. If the score continues to be > 6.5 points from baseline at reassessment, we will initiate lymphedema treatment. Assessing in this manner has a 95% rate of preventing clinically significant lymphedema.    Problem List Patient Active Problem List   Diagnosis Date Noted  . Malignant neoplasm of upper-outer quadrant of left breast in female, estrogen receptor negative ([place]) 07/27/2019  . Neuroretinitis of both eyes 11/14/2018  . Retro-orbital pain   . Papilledema    Annia Friendly, [place] 07/28/19 2:01 PM  [place], [place], [address] Phone: [phone]   Fax:  [phone]  Name: [name] MRN: [id number] Date of Birth: January 22, 1966

## 2019-07-29 ENCOUNTER — Other Ambulatory Visit: Payer: Self-pay | Admitting: General Surgery

## 2019-07-29 DIAGNOSIS — Z171 Estrogen receptor negative status [ER-]: Secondary | ICD-10-CM

## 2019-07-29 DIAGNOSIS — C50412 Malignant neoplasm of upper-outer quadrant of left female breast: Secondary | ICD-10-CM

## 2019-07-30 ENCOUNTER — Other Ambulatory Visit: Payer: Self-pay | Admitting: General Surgery

## 2019-07-30 ENCOUNTER — Telehealth: Payer: Self-pay | Admitting: Hematology and Oncology

## 2019-07-30 DIAGNOSIS — C50412 Malignant neoplasm of upper-outer quadrant of left female breast: Secondary | ICD-10-CM

## 2019-07-30 DIAGNOSIS — Z171 Estrogen receptor negative status [ER-]: Secondary | ICD-10-CM

## 2019-07-30 NOTE — Telephone Encounter (Signed)
Scheduled appt per 4/9 sch message - unable to reach pt .left message with appt date and time

## 2019-07-30 NOTE — Pre-Procedure Instructions (Signed)
[name]  07/30/2019     Your procedure is scheduled on Thursday, April 15.  Report to [facility], Main Entrance or Entrance "A" at  11:30 A.M.                Your surgery or procedure is scheduled for 1:30 AM   Call this number if you have problems the morning of surgery: [phone]  This is the number for the Pre- Surgical Desk.                  For any other questions, please call [phone], Monday - Friday 8 AM - 4 PM.                  Remember:  Do not eat after midnight, Wednesday, April 14.  You may drink clear liquids until  10:30 AM .  Clear liquids allowed are: Water, Juice (non-citric and without pulp), Clear Tea, Plain Jell-O only, Gatorade and Plain Popsicles only .   Please drink the Pre Surgery Ensure between 8:00 AM and 8:30 AM   Take these medicines the morning of surgery with A SIP OF WATER: None  Stop ttaking Aspirin, Aspirin Products (Goody Powder, Excedrin Migraine), Ibuprofen (Advil), Naproxen (Aleve), Vitamins and Herbal Products (ie Fish Oil).   Special instructions:  [facility]- Preparing For Surgery  Before surgery, you can play an important role. Because skin is not sterile, your skin needs to be as free of germs as possible. You can reduce the number of germs on your skin by washing with CHG (chlorahexidine gluconate) Soap before surgery.  CHG is an antiseptic cleaner which kills germs and bonds with the skin to continue killing germs even after washing.    Oral Hygiene is also important to reduce your risk of infection.  Remember - BRUSH YOUR TEETH THE MORNING OF SURGERY WITH YOUR REGULAR TOOTHPASTE  Please do not use if you have an allergy to CHG or antibacterial soaps. If your skin becomes reddened/irritated stop using the CHG.  Do not shave (including legs and underarms) for at least 48 hours prior to first CHG shower. It is OK to shave your face.  Please follow these instructions carefully.   1. Shower the NIGHT BEFORE  SURGERY and the MORNING OF SURGERY with CHG.   2. If you chose to wash your hair, wash your hair first as usual with your normal shampoo.  3. After you shampoo,wash your face and private area with the soap you use at home, then rinse your hair and body thoroughly to remove the shampoo and soap.  4. Use CHG as you would any other liquid soap. You can apply CHG directly to the skin and wash gently with a scrungie or a clean washcloth.   5. Apply the CHG Soap to your body ONLY FROM THE NECK DOWN.  Do not use on open wounds or open sores. Avoid contact with your eyes, ears, mouth and genitals (private parts).   6. Wash thoroughly, paying special attention to the area where your surgery will be performed.  7. Thoroughly rinse your body with warm water from the neck down.  8. DO NOT shower/wash with your normal soap after using and rinsing off the CHG Soap.  9. Pat yourself dry with a CLEAN TOWEL.  10. Wear CLEAN PAJAMAS to bed the night before surgery, wear comfortable clothes the morning of surgery  11. Place CLEAN SHEETS on your bed the night of your  first shower and DO NOT SLEEP WITH PETS.  Day of Surgery: Shower as instructed above.  Do not wear lotions, powders, or perfumes, or deodorant. Please wear clean clothes to the hospital/surgery center.   Remember to brush your teeth WITH YOUR REGULAR TOOTHPASTE.  Do not wear jewelry, make-up or nail polish.  Do not wear lotions, powders, or perfumes, or deodorant.  Do not shave 48 hours prior to surgery.    Do not bring valuables to the hospital.  [facility] is not responsible for any belongings or valuables.  Contacts, dentures or bridgework may not be worn into surgery.  Leave your suitcase in the car.  After surgery it may be brought to your room.  For patients admitted to the hospital, discharge time will be determined by your treatment team.  Patients discharged the day of surgery will not be allowed to drive home.   Please read  over the following fact sheets that you were given.

## 2019-08-02 ENCOUNTER — Other Ambulatory Visit (HOSPITAL_COMMUNITY)
Admission: RE | Admit: 2019-08-02 | Discharge: 2019-08-02 | Disposition: A | Discharge status: Home / Self Care | Payer: BC Managed Care – PPO | Source: Ambulatory Visit | Attending: General Surgery | Admitting: General Surgery

## 2019-08-02 ENCOUNTER — Encounter (HOSPITAL_COMMUNITY): Payer: Self-pay

## 2019-08-02 ENCOUNTER — Other Ambulatory Visit: Payer: Self-pay

## 2019-08-02 ENCOUNTER — Encounter (HOSPITAL_COMMUNITY)
Admission: RE | Admit: 2019-08-02 | Discharge: 2019-08-02 | Disposition: A | Discharge status: Home / Self Care | Payer: BC Managed Care – PPO | Source: Ambulatory Visit | Attending: General Surgery | Admitting: General Surgery

## 2019-08-02 ENCOUNTER — Ambulatory Visit (HOSPITAL_COMMUNITY)
Admission: RE | Admit: 2019-08-02 | Discharge: 2019-08-02 | Disposition: A | Discharge status: Home / Self Care | Payer: BC Managed Care – PPO | Source: Ambulatory Visit | Attending: Hematology and Oncology | Admitting: Hematology and Oncology

## 2019-08-02 DIAGNOSIS — Z171 Estrogen receptor negative status [ER-]: Secondary | ICD-10-CM | POA: Insufficient documentation

## 2019-08-02 DIAGNOSIS — Z20822 Contact with and (suspected) exposure to covid-19: Secondary | ICD-10-CM | POA: Insufficient documentation

## 2019-08-02 DIAGNOSIS — I340 Nonrheumatic mitral (valve) insufficiency: Secondary | ICD-10-CM | POA: Insufficient documentation

## 2019-08-02 DIAGNOSIS — C50412 Malignant neoplasm of upper-outer quadrant of left female breast: Secondary | ICD-10-CM

## 2019-08-02 DIAGNOSIS — Z01818 Encounter for other preprocedural examination: Secondary | ICD-10-CM | POA: Diagnosis present

## 2019-08-02 HISTORY — DX: Malignant (primary) neoplasm, unspecified: C80.1

## 2019-08-02 HISTORY — DX: Headache, unspecified: R51.9

## 2019-08-02 LAB — SARS CORONAVIRUS 2 (TAT 6-24 HRS): SARS Coronavirus 2: NEGATIVE

## 2019-08-02 NOTE — Progress Notes (Signed)
  Echocardiogram 2D Echocardiogram has been performed.  [name] [name] 08/02/2019, 9:43 AM

## 2019-08-02 NOTE — Progress Notes (Addendum)
PCP - Dr. Antony Blackbird  Cardiologist - Dr Nicholas Lose for pre chemo ECHO  Chest x-ray - na  EKG - 11/09/2018  Stress Test - no  ECHO - 08/02/2019  Cardiac Cath - no  Sleep Study - no CPAP - no  LABS- CMP and CBC with diff were drawn on 07/28/2019  ASA-no  ERAS-yes  HA1C-na Fasting Blood Sugar - na  Anesthesia-  Pt denies having chest pain, sob, or fever at this time. All instructions explained to the pt, with a verbal understanding of the material. Pt agrees to go over the instructions while at home for a better understanding. Pt also instructed to self quarantine after being tested for COVID-19. The opportunity to ask questions was provided.

## 2019-08-05 ENCOUNTER — Other Ambulatory Visit: Payer: Self-pay | Admitting: General Surgery

## 2019-08-05 ENCOUNTER — Ambulatory Visit (HOSPITAL_COMMUNITY)
Admission: RE | Admit: 2019-08-05 | Discharge: 2019-08-05 | Disposition: A | Discharge status: Home / Self Care | Payer: BC Managed Care – PPO | Source: Ambulatory Visit | Attending: General Surgery | Admitting: General Surgery

## 2019-08-05 ENCOUNTER — Other Ambulatory Visit: Payer: Self-pay

## 2019-08-05 ENCOUNTER — Ambulatory Visit (HOSPITAL_COMMUNITY): Payer: BC Managed Care – PPO

## 2019-08-05 ENCOUNTER — Encounter (HOSPITAL_COMMUNITY): Payer: Self-pay | Admitting: General Surgery

## 2019-08-05 ENCOUNTER — Encounter: Payer: Self-pay | Admitting: *Deleted

## 2019-08-05 ENCOUNTER — Ambulatory Visit
Admission: RE | Admit: 2019-08-05 | Discharge: 2019-08-05 | Disposition: A | Discharge status: Home / Self Care | Payer: BC Managed Care – PPO | Source: Ambulatory Visit | Attending: General Surgery | Admitting: General Surgery

## 2019-08-05 ENCOUNTER — Ambulatory Visit (HOSPITAL_COMMUNITY): Payer: BC Managed Care – PPO | Admitting: Anesthesiology

## 2019-08-05 ENCOUNTER — Ambulatory Visit (HOSPITAL_COMMUNITY)
Admission: RE | Admit: 2019-08-05 | Discharge: 2019-08-05 | Disposition: A | Discharge status: Home / Self Care | Payer: BC Managed Care – PPO | Attending: General Surgery | Admitting: General Surgery

## 2019-08-05 ENCOUNTER — Encounter (HOSPITAL_COMMUNITY)
Admission: RE | Disposition: A | Discharge status: Home / Self Care | Payer: Self-pay | Source: Home / Self Care | Attending: General Surgery

## 2019-08-05 DIAGNOSIS — Z170 Estrogen receptor positive status [ER+]: Secondary | ICD-10-CM | POA: Diagnosis not present

## 2019-08-05 DIAGNOSIS — Z419 Encounter for procedure for purposes other than remedying health state, unspecified: Secondary | ICD-10-CM

## 2019-08-05 DIAGNOSIS — Z171 Estrogen receptor negative status [ER-]: Secondary | ICD-10-CM

## 2019-08-05 DIAGNOSIS — C50412 Malignant neoplasm of upper-outer quadrant of left female breast: Secondary | ICD-10-CM | POA: Diagnosis not present

## 2019-08-05 DIAGNOSIS — M1990 Unspecified osteoarthritis, unspecified site: Secondary | ICD-10-CM | POA: Insufficient documentation

## 2019-08-05 DIAGNOSIS — Z95828 Presence of other vascular implants and grafts: Secondary | ICD-10-CM

## 2019-08-05 DIAGNOSIS — C50912 Malignant neoplasm of unspecified site of left female breast: Secondary | ICD-10-CM | POA: Diagnosis present

## 2019-08-05 HISTORY — PX: BREAST LUMPECTOMY WITH RADIOACTIVE SEED AND SENTINEL LYMPH NODE BIOPSY: SHX6550

## 2019-08-05 HISTORY — PX: PORTACATH PLACEMENT: SHX2246

## 2019-08-05 SURGERY — BREAST LUMPECTOMY WITH RADIOACTIVE SEED AND SENTINEL LYMPH NODE BIOPSY
Anesthesia: General | Site: Chest | Laterality: Right

## 2019-08-05 MED ORDER — PROPOFOL 10 MG/ML IV BOLUS
INTRAVENOUS | Status: DC | PRN
  Administered 2019-08-05: 150 mg via INTRAVENOUS

## 2019-08-05 MED ORDER — HEPARIN SOD (PORK) LOCK FLUSH 100 UNIT/ML IV SOLN
INTRAVENOUS | Status: AC
  Filled 2019-08-05: qty 5

## 2019-08-05 MED ORDER — DEXAMETHASONE SODIUM PHOSPHATE 10 MG/ML IJ SOLN
INTRAMUSCULAR | Status: AC
  Filled 2019-08-05: qty 3

## 2019-08-05 MED ORDER — BUPIVACAINE HCL 0.25 % IJ SOLN
INTRAMUSCULAR | Status: DC | PRN
  Administered 2019-08-05: 7 mL

## 2019-08-05 MED ORDER — MIDAZOLAM HCL 2 MG/2ML IJ SOLN
INTRAMUSCULAR | Status: AC
  Administered 2019-08-05: 1 mg via INTRAVENOUS
  Filled 2019-08-05: qty 2

## 2019-08-05 MED ORDER — ENSURE PRE-SURGERY PO LIQD: 296.0000 mL | Freq: Once | ORAL | Status: DC

## 2019-08-05 MED ORDER — TECHNETIUM TC 99M SULFUR COLLOID FILTERED
1.0000 | Freq: Once | INTRAVENOUS | Status: AC | PRN
  Administered 2019-08-05: 12:00:00 1 via INTRADERMAL

## 2019-08-05 MED ORDER — PROPOFOL 10 MG/ML IV BOLUS
INTRAVENOUS | Status: AC
  Filled 2019-08-05: qty 20

## 2019-08-05 MED ORDER — CEFAZOLIN SODIUM-DEXTROSE 2-4 GM/100ML-% IV SOLN
INTRAVENOUS | Status: AC
  Filled 2019-08-05: qty 100

## 2019-08-05 MED ORDER — ACETAMINOPHEN 500 MG PO TABS
ORAL_TABLET | ORAL | Status: AC
  Administered 2019-08-05: 1000 mg via ORAL
  Filled 2019-08-05: qty 2

## 2019-08-05 MED ORDER — OXYCODONE HCL 5 MG PO TABS: 5.0000 mg | ORAL_TABLET | Freq: Four times a day (QID) | ORAL | 0 refills | Status: DC | PRN

## 2019-08-05 MED ORDER — SODIUM CHLORIDE 0.9 % IV SOLN
INTRAVENOUS | Status: AC
  Filled 2019-08-05: qty 1.2

## 2019-08-05 MED ORDER — LIDOCAINE 2% (20 MG/ML) 5 ML SYRINGE
INTRAMUSCULAR | Status: AC
  Filled 2019-08-05: qty 15

## 2019-08-05 MED ORDER — GABAPENTIN 100 MG PO CAPS: 100.0000 mg | ORAL_CAPSULE | ORAL | Status: AC

## 2019-08-05 MED ORDER — CLONIDINE HCL (ANALGESIA) 100 MCG/ML EP SOLN
EPIDURAL | Status: DC | PRN
  Administered 2019-08-05: 50 ug

## 2019-08-05 MED ORDER — SODIUM CHLORIDE 0.9 % IV SOLN
INTRAVENOUS | Status: DC | PRN
  Administered 2019-08-05: 500 mL

## 2019-08-05 MED ORDER — FENTANYL CITRATE (PF) 100 MCG/2ML IJ SOLN
INTRAMUSCULAR | Status: AC
  Administered 2019-08-05: 50 ug via INTRAVENOUS
  Filled 2019-08-05: qty 2

## 2019-08-05 MED ORDER — MIDAZOLAM HCL 2 MG/2ML IJ SOLN
INTRAMUSCULAR | Status: AC
  Filled 2019-08-05: qty 2

## 2019-08-05 MED ORDER — ONDANSETRON HCL 4 MG/2ML IJ SOLN
INTRAMUSCULAR | Status: AC
  Filled 2019-08-05: qty 6

## 2019-08-05 MED ORDER — HEPARIN SOD (PORK) LOCK FLUSH 100 UNIT/ML IV SOLN
INTRAVENOUS | Status: DC | PRN
  Administered 2019-08-05: 500 [IU] via INTRAVENOUS

## 2019-08-05 MED ORDER — SODIUM CHLORIDE (PF) 0.9 % IJ SOLN
INTRAMUSCULAR | Status: AC
  Filled 2019-08-05: qty 10

## 2019-08-05 MED ORDER — LACTATED RINGERS IV SOLN: INTRAVENOUS | Status: DC

## 2019-08-05 MED ORDER — BUPIVACAINE-EPINEPHRINE (PF) 0.5% -1:200000 IJ SOLN
INTRAMUSCULAR | Status: DC | PRN
  Administered 2019-08-05: 25 mL

## 2019-08-05 MED ORDER — LACTATED RINGERS IV SOLN
INTRAVENOUS | Status: DC | PRN
  Administered 2019-08-05: 1000 mL via INTRAVENOUS

## 2019-08-05 MED ORDER — BUPIVACAINE HCL (PF) 0.25 % IJ SOLN
INTRAMUSCULAR | Status: AC
  Filled 2019-08-05: qty 30

## 2019-08-05 MED ORDER — PROMETHAZINE HCL 25 MG/ML IJ SOLN: 6.2500 mg | INTRAMUSCULAR | Status: DC | PRN

## 2019-08-05 MED ORDER — EPHEDRINE 5 MG/ML INJ
INTRAVENOUS | Status: AC
  Filled 2019-08-05: qty 10

## 2019-08-05 MED ORDER — GABAPENTIN 100 MG PO CAPS
ORAL_CAPSULE | ORAL | Status: AC
  Administered 2019-08-05: 100 mg via ORAL
  Filled 2019-08-05: qty 1

## 2019-08-05 MED ORDER — FENTANYL CITRATE (PF) 250 MCG/5ML IJ SOLN
INTRAMUSCULAR | Status: AC
  Filled 2019-08-05: qty 5

## 2019-08-05 MED ORDER — FENTANYL CITRATE (PF) 100 MCG/2ML IJ SOLN: 25.0000 ug | INTRAMUSCULAR | Status: DC | PRN

## 2019-08-05 MED ORDER — PHENYLEPHRINE HCL (PRESSORS) 10 MG/ML IV SOLN
INTRAVENOUS | Status: DC | PRN
  Administered 2019-08-05 (×3): 80 ug via INTRAVENOUS
  Administered 2019-08-05: 40 ug via INTRAVENOUS

## 2019-08-05 MED ORDER — FENTANYL CITRATE (PF) 100 MCG/2ML IJ SOLN
INTRAMUSCULAR | Status: DC | PRN
  Administered 2019-08-05 (×4): 25 ug via INTRAVENOUS

## 2019-08-05 MED ORDER — KETOROLAC TROMETHAMINE 15 MG/ML IJ SOLN: 15.0000 mg | INTRAMUSCULAR | Status: AC

## 2019-08-05 MED ORDER — ONDANSETRON HCL 4 MG/2ML IJ SOLN
INTRAMUSCULAR | Status: DC | PRN
  Administered 2019-08-05: 4 mg via INTRAVENOUS

## 2019-08-05 MED ORDER — 0.9 % SODIUM CHLORIDE (POUR BTL) OPTIME
TOPICAL | Status: DC | PRN
  Administered 2019-08-05: 12:00:00 1000 mL

## 2019-08-05 MED ORDER — MIDAZOLAM HCL 2 MG/2ML IJ SOLN: 1.0000 mg | Freq: Once | INTRAMUSCULAR | Status: AC

## 2019-08-05 MED ORDER — KETOROLAC TROMETHAMINE 15 MG/ML IJ SOLN
INTRAMUSCULAR | Status: AC
  Administered 2019-08-05: 15 mg via INTRAVENOUS
  Filled 2019-08-05: qty 1

## 2019-08-05 MED ORDER — CEFAZOLIN SODIUM-DEXTROSE 2-4 GM/100ML-% IV SOLN
2.0000 g | INTRAVENOUS | Status: AC
  Administered 2019-08-05: 12:00:00 2 g via INTRAVENOUS

## 2019-08-05 MED ORDER — PHENYLEPHRINE HCL-NACL 10-0.9 MG/250ML-% IV SOLN
INTRAVENOUS | Status: DC | PRN
  Administered 2019-08-05: 20 ug/min via INTRAVENOUS

## 2019-08-05 MED ORDER — LIDOCAINE 2% (20 MG/ML) 5 ML SYRINGE
INTRAMUSCULAR | Status: DC | PRN
  Administered 2019-08-05: 20 mg via INTRAVENOUS

## 2019-08-05 MED ORDER — METHYLENE BLUE 0.5 % INJ SOLN
INTRAVENOUS | Status: AC
  Filled 2019-08-05: qty 10

## 2019-08-05 MED ORDER — PHENYLEPHRINE 40 MCG/ML (10ML) SYRINGE FOR IV PUSH (FOR BLOOD PRESSURE SUPPORT)
PREFILLED_SYRINGE | INTRAVENOUS | Status: AC
  Filled 2019-08-05: qty 10

## 2019-08-05 MED ORDER — FENTANYL CITRATE (PF) 100 MCG/2ML IJ SOLN: 50.0000 ug | Freq: Once | INTRAMUSCULAR | Status: AC

## 2019-08-05 MED ORDER — ACETAMINOPHEN 500 MG PO TABS: 1000.0000 mg | ORAL_TABLET | ORAL | Status: AC

## 2019-08-05 MED ORDER — ROCURONIUM BROMIDE 10 MG/ML (PF) SYRINGE
PREFILLED_SYRINGE | INTRAVENOUS | Status: AC
  Filled 2019-08-05: qty 20

## 2019-08-05 SURGICAL SUPPLY — 75 items
ADH SKN CLS APL DERMABOND .7 (GAUZE/BANDAGES/DRESSINGS) ×4
APL PRP STRL LF DISP 70% ISPRP (MISCELLANEOUS) ×1
APPLIER CLIP 9.375 MED OPEN (MISCELLANEOUS)
APR CLP MED 9.3 20 MLT OPN (MISCELLANEOUS)
BAG DECANTER FOR FLEXI CONT (MISCELLANEOUS) ×2 IMPLANT
BINDER BREAST LRG (GAUZE/BANDAGES/DRESSINGS) IMPLANT
BINDER BREAST XLRG (GAUZE/BANDAGES/DRESSINGS) IMPLANT
CANISTER SUCT 3000ML PPV (MISCELLANEOUS) ×4 IMPLANT
CHLORAPREP W/TINT 10.5 ML (MISCELLANEOUS) ×4 IMPLANT
CHLORAPREP W/TINT 26 (MISCELLANEOUS) ×4 IMPLANT
CLIP APPLIE 9.375 MED OPEN (MISCELLANEOUS) IMPLANT
CLIP VESOCCLUDE MED 6/CT (CLIP) ×4 IMPLANT
CLOSURE STERI-STRIP 1/4X4 (GAUZE/BANDAGES/DRESSINGS) ×4 IMPLANT
CLOSURE WOUND 1/2 X4 (GAUZE/BANDAGES/DRESSINGS) ×1
COVER PROBE W GEL 5X96 (DRAPES) ×4 IMPLANT
COVER SURGICAL LIGHT HANDLE (MISCELLANEOUS) ×4 IMPLANT
COVER TRANSDUCER ULTRASND GEL (DRAPE) ×4 IMPLANT
COVER WAND RF STERILE (DRAPES) ×2 IMPLANT
DECANTER SPIKE VIAL GLASS SM (MISCELLANEOUS) ×4 IMPLANT
DERMABOND ADVANCED (GAUZE/BANDAGES/DRESSINGS) ×4
DERMABOND ADVANCED .7 DNX12 (GAUZE/BANDAGES/DRESSINGS) ×2 IMPLANT
DEVICE DUBIN SPECIMEN MAMMOGRA (MISCELLANEOUS) ×4 IMPLANT
DRAPE C-ARM 42X120 X-RAY (DRAPES) ×4 IMPLANT
DRAPE CHEST BREAST 15X10 FENES (DRAPES) ×4 IMPLANT
ELECT CAUTERY BLADE 6.4 (BLADE) ×4 IMPLANT
ELECT COATED BLADE 2.86 ST (ELECTRODE) ×4 IMPLANT
ELECT REM PT RETURN 9FT ADLT (ELECTROSURGICAL) ×4
ELECTRODE REM PT RTRN 9FT ADLT (ELECTROSURGICAL) ×2 IMPLANT
GAUZE 4X4 16PLY RFD (DISPOSABLE) ×4 IMPLANT
GEL ULTRASOUND 20GR AQUASONIC (MISCELLANEOUS) ×4 IMPLANT
GLOVE BIO SURGEON STRL SZ7 (GLOVE) ×8 IMPLANT
GLOVE BIOGEL PI IND STRL 7.5 (GLOVE) ×2 IMPLANT
GLOVE BIOGEL PI INDICATOR 7.5 (GLOVE) ×4
GOWN STRL REUS W/ TWL LRG LVL3 (GOWN DISPOSABLE) ×4 IMPLANT
GOWN STRL REUS W/TWL LRG LVL3 (GOWN DISPOSABLE) ×16
ILLUMINATOR WAVEGUIDE N/F (MISCELLANEOUS) IMPLANT
INTRODUCER COOK 11FR (CATHETERS) IMPLANT
KIT BASIN OR (CUSTOM PROCEDURE TRAY) ×4 IMPLANT
KIT MARKER MARGIN INK (KITS) ×4 IMPLANT
KIT PORT POWER 8FR ISP CVUE (Port) ×4 IMPLANT
KIT TURNOVER KIT B (KITS) ×4 IMPLANT
NDL 18GX1X1/2 (RX/OR ONLY) (NEEDLE) IMPLANT
NDL FILTER BLUNT 18X1 1/2 (NEEDLE) IMPLANT
NDL HYPO 25GX1X1/2 BEV (NEEDLE) ×2 IMPLANT
NEEDLE 18GX1X1/2 (RX/OR ONLY) (NEEDLE) IMPLANT
NEEDLE FILTER BLUNT 18X 1/2SAF (NEEDLE)
NEEDLE FILTER BLUNT 18X1 1/2 (NEEDLE) IMPLANT
NEEDLE HYPO 25GX1X1/2 BEV (NEEDLE) ×4 IMPLANT
NS IRRIG 1000ML POUR BTL (IV SOLUTION) ×4 IMPLANT
PACK GENERAL/GYN (CUSTOM PROCEDURE TRAY) ×2 IMPLANT
PAD ARMBOARD 7.5X6 YLW CONV (MISCELLANEOUS) ×8 IMPLANT
PENCIL BUTTON HOLSTER BLD 10FT (ELECTRODE) ×6 IMPLANT
POSITIONER HEAD DONUT 9IN (MISCELLANEOUS) ×4 IMPLANT
RETRACTOR ONETRAX LX 90X20 (MISCELLANEOUS) ×2 IMPLANT
SET INTRODUCER 12FR PACEMAKER (INTRODUCER) IMPLANT
SET SHEATH INTRODUCER 10FR (MISCELLANEOUS) IMPLANT
SHEATH COOK PEEL AWAY SET 9F (SHEATH) IMPLANT
STRIP CLOSURE SKIN 1/2X4 (GAUZE/BANDAGES/DRESSINGS) ×3 IMPLANT
SUT MNCRL AB 4-0 PS2 18 (SUTURE) ×10 IMPLANT
SUT MON AB 5-0 PS2 18 (SUTURE) ×2 IMPLANT
SUT PROLENE 2 0 SH DA (SUTURE) ×4 IMPLANT
SUT SILK 2 0 (SUTURE)
SUT SILK 2 0 SH (SUTURE) ×2 IMPLANT
SUT SILK 2-0 18XBRD TIE 12 (SUTURE) IMPLANT
SUT VIC AB 2-0 SH 27 (SUTURE) ×12
SUT VIC AB 2-0 SH 27X BRD (SUTURE) IMPLANT
SUT VIC AB 2-0 SH 27XBRD (SUTURE) ×4 IMPLANT
SUT VIC AB 3-0 SH 27 (SUTURE) ×12
SUT VIC AB 3-0 SH 27X BRD (SUTURE) ×4 IMPLANT
SUT VIC AB 3-0 SH 27XBRD (SUTURE) ×2 IMPLANT
SYR 5ML LUER SLIP (SYRINGE) ×4 IMPLANT
SYR CONTROL 10ML LL (SYRINGE) ×4 IMPLANT
TOWEL GREEN STERILE (TOWEL DISPOSABLE) ×4 IMPLANT
TOWEL GREEN STERILE FF (TOWEL DISPOSABLE) ×4 IMPLANT
TRAY LAPAROSCOPIC MC (CUSTOM PROCEDURE TRAY) ×4 IMPLANT

## 2019-08-05 NOTE — Discharge Instructions (Signed)
[facility] Office Phone Number [phone]  BREAST BIOPSY/ PARTIAL MASTECTOMY: POST OP INSTRUCTIONS Take 400 mg of ibuprofen every 8 hours or 650 mg tylenol every 6 hours for next 72 hours then as needed. Use ice several times daily also. Always review your discharge instruction sheet given to you by the facility where your surgery was performed.  IF YOU HAVE DISABILITY OR FAMILY LEAVE FORMS, YOU MUST BRING THEM TO THE OFFICE FOR PROCESSING.  DO NOT GIVE THEM TO YOUR DOCTOR.  1. A prescription for pain medication may be given to you upon discharge.  Take your pain medication as prescribed, if needed.  If narcotic pain medicine is not needed, then you may take acetaminophen (Tylenol), naprosyn (Alleve) or ibuprofen (Advil) as needed. 2. Take your usually prescribed medications unless otherwise directed 3. If you need a refill on your pain medication, please contact your pharmacy.  They will contact our office to request authorization.  Prescriptions will not be filled after 5pm or on week-ends. 4. You should eat very light the first 24 hours after surgery, such as soup, crackers, pudding, etc.  Resume your normal diet the day after surgery. 5. Most patients will experience some swelling and bruising in the breast.  Ice packs and a good support bra will help.  Wear the breast binder provided or a sports bra for 72 hours day and night.  After that wear a sports bra during the day until you return to the office. Swelling and bruising can take several days to resolve.  6. It is common to experience some constipation if taking pain medication after surgery.  Increasing fluid intake and taking a stool softener will usually help or prevent this problem from occurring.  A mild laxative (Milk of Magnesia or Miralax) should be taken according to package directions if there are no bowel movements after 48 hours. 7. Unless discharge instructions indicate otherwise, you may remove your bandages 48  hours after surgery and you may shower at that time.  You may have steri-strips (small skin tapes) in place directly over the incision.  These strips should be left on the skin for 7-10 days and will come off on their own.  If your surgeon used skin glue on the incision, you may shower in 24 hours.  The glue will flake off over the next 2-3 weeks.  Any sutures or staples will be removed at the office during your follow-up visit. 8. ACTIVITIES:  You may resume regular daily activities (gradually increasing) beginning the next day.  Wearing a good support bra or sports bra minimizes pain and swelling.  You may have sexual intercourse when it is comfortable. a. You may drive when you no longer are taking prescription pain medication, you can comfortably wear a seatbelt, and you can safely maneuver your car and apply brakes. b. RETURN TO WORK:  ______________________________________________________________________________________ 9. You should see your doctor in the office for a follow-up appointment approximately two weeks after your surgery.  Your doctor's nurse will typically make your follow-up appointment when she calls you with your pathology report.  Expect your pathology report 3-4 business days after your surgery.  You may call to check if you do not hear from us after three days. 10. OTHER INSTRUCTIONS: _______________________________________________________________________________________________ _____________________________________________________________________________________________________________________________________ _____________________________________________________________________________________________________________________________________ _____________________________________________________________________________________________________________________________________  WHEN TO CALL DR [name]: 1. Fever over 101.0 2. Nausea and/or vomiting. 3. Extreme swelling or  bruising. 4. Continued bleeding from incision. 5. Increased pain, redness, or drainage from the incision.  The clinic   staff is available to answer your questions during regular business hours.  Please don't hesitate to call and ask to speak to one of the nurses for clinical concerns.  If you have a medical emergency, go to the nearest emergency room or call 911.  A surgeon from [facility] is always on call at the hospital.  For further questions, please visit [email or web address]       PORT-A-CATH: POST OP INSTRUCTIONS  Always review your discharge instruction sheet given to you by the facility where your surgery was performed.   1. A prescription for pain medication may be given to you upon discharge. Take your pain medication as prescribed, if needed. If narcotic pain medicine is not needed, then you make take acetaminophen (Tylenol) or ibuprofen (Advil) as needed.  2. Take your usually prescribed medications unless otherwise directed. 3. If you need a refill on your pain medication, please contact our office. All narcotic pain medicine now requires a paper prescription.  Phoned in and fax refills are no longer allowed by law.  Prescriptions will not be filled after 5 pm or on weekends.  4. You should follow a light diet for the remainder of the day after your procedure. 5. Most patients will experience some mild swelling and/or bruising in the area of the incision. It may take several days to resolve. 6. It is common to experience some constipation if taking pain medication after surgery. Increasing fluid intake and taking a stool softener (such as Colace) will usually help or prevent this problem from occurring. A mild laxative (Milk of Magnesia or Miralax) should be taken according to package directions if there are no bowel movements after 48 hours.  7. Unless discharge instructions indicate otherwise, you may remove your bandages 48 hours after surgery, and you  may shower at that time. You may have steri-strips (small white skin tapes) in place directly over the incision.  These strips should be left on the skin for 7-10 days.  If your surgeon used Dermabond (skin glue) on the incision, you may shower in 24 hours.  The glue will flake off over the next 2-3 weeks.  8. If your port is left accessed at the end of surgery (needle left in port), the dressing cannot get wet and should only by changed by a healthcare professional. When the port is no longer accessed (when the needle has been removed), follow step 7.   9. ACTIVITIES:  Limit activity involving your arms for the next 72 hours. Do no strenuous exercise or activity for 1 week. You may drive when you are no longer taking prescription pain medication, you can comfortably wear a seatbelt, and you can maneuver your car. 10.You may need to see your doctor in the office for a follow-up appointment.  Please       check with your doctor.  11.When you receive a new Port-a-Cath, you will get a product guide and        ID card.  Please keep them in case you need them.  WHEN TO CALL YOUR DOCTOR [phone]): 1. Fever over 101.0 2. Chills 3. Continued bleeding from incision 4. Increased redness and tenderness at the site 5. Shortness of breath, difficulty breathing   The clinic staff is available to answer your questions during regular business hours. Please don't hesitate to call and ask to speak to one of the nurses or medical assistants for clinical concerns. If you have a medical emergency, go to the nearest emergency  room or call 911.  A surgeon from [facility] is always on call at the hospital.     For further information, please visit [email or web address]

## 2019-08-05 NOTE — Transfer of Care (Signed)
Immediate Anesthesia Transfer of Care Note  Patient: [name]  Procedure(s) Performed: LEFT BREAST LUMPECTOMY WITH RADIOACTIVE SEED AND LEFT AXILLARY SENTINEL LYMPH NODE BIOPSY (Left Breast) INSERTION PORT-A-CATH WITH ULTRASOUND GUIDANCE (Right Chest)  Patient Location: PACU  Anesthesia Type:General  Level of Consciousness: awake, alert  and oriented  Airway & Oxygen Therapy: Patient Spontanous Breathing and Patient connected to nasal cannula oxygen  Post-op Assessment: Report given to RN, Post -op Vital signs reviewed and stable and Patient moving all extremities X 4  Post vital signs: Reviewed and stable  Last Vitals:  Vitals Value Taken Time  BP 136/102 08/05/19 1406  Temp    Pulse 101 08/05/19 1407  Resp 27 08/05/19 1407  SpO2 100 % 08/05/19 1407  Vitals shown include unvalidated device data.  Last Pain:  Vitals:   08/05/19 1215  TempSrc:   PainSc: 0-No pain      Patients Stated Pain Goal: 3 ([id number])  Complications: No apparent anesthesia complications

## 2019-08-05 NOTE — Anesthesia Preprocedure Evaluation (Addendum)
Anesthesia Evaluation  Patient identified by MRN, date of birth, ID band Patient awake    Reviewed: Allergy & Precautions, NPO status , Patient's Chart, lab work & pertinent test results  Airway Mallampati: I  TM Distance: >3 FB Neck ROM: Full    Dental no notable dental hx. (+) Chipped,    Pulmonary neg pulmonary ROS,    Pulmonary exam normal breath sounds clear to auscultation       Cardiovascular negative cardio ROS Normal cardiovascular exam Rhythm:Regular Rate:Normal     Neuro/Psych negative neurological ROS  negative psych ROS   GI/Hepatic negative GI ROS, Neg liver ROS,   Endo/Other  negative endocrine ROS  Renal/GU negative Renal ROS  negative genitourinary   Musculoskeletal  (+) Arthritis ,   Abdominal   Peds  Hematology negative hematology ROS (+)   Anesthesia Other Findings   Reproductive/Obstetrics                             Anesthesia Physical  Anesthesia Plan  ASA: II  Anesthesia Plan: General   Post-op Pain Management:  Regional for Post-op pain   Induction: Intravenous  PONV Risk Score and Plan: 3 and Ondansetron, Dexamethasone and Midazolam  Airway Management Planned: LMA  Additional Equipment:   Intra-op Plan:   Post-operative Plan: Extubation in OR  Informed Consent: I have reviewed the patients History and Physical, chart, labs and discussed the procedure including the risks, benefits and alternatives for the proposed anesthesia with the patient or authorized representative who has indicated his/her understanding and acceptance.     Dental advisory given  Plan Discussed with: CRNA  Anesthesia Plan Comments:        Anesthesia Quick Evaluation

## 2019-08-05 NOTE — Interval H&P Note (Signed)
History and Physical Interval Note:  08/05/2019 11:05 AM  [place]  has presented today for surgery, with the diagnosis of LEFT BREAST CANCER.  The various methods of treatment have been discussed with the patient and family. After consideration of risks, benefits and other options for treatment, the patient has consented to  Procedure(s) with comments: LEFT BREAST LUMPECTOMY WITH RADIOACTIVE SEED AND LEFT AXILLARY SENTINEL LYMPH NODE BIOPSY (Left) - PEC BLOCK INSERTION PORT-A-CATH WITH ULTRASOUND GUIDANCE (N/A) as a surgical intervention.  The patient's history has been reviewed, patient examined, no change in status, stable for surgery.  I have reviewed the patient's chart and labs.  Questions were answered to the patient's satisfaction.     Rolm Bookbinder

## 2019-08-05 NOTE — Anesthesia Procedure Notes (Signed)
Procedure Name: LMA Insertion Date/Time: 08/05/2019 12:25 PM Performed by: Neldon Newport, CRNA Pre-anesthesia Checklist: Timeout performed, Emergency Drugs available, Suction available, Patient being monitored and Patient identified Patient Re-evaluated:Patient Re-evaluated prior to induction Oxygen Delivery Method: Circle system utilized Preoxygenation: Pre-oxygenation with 100% oxygen Induction Type: IV induction Ventilation: Mask ventilation without difficulty LMA: LMA inserted LMA Size: 4.0 Number of attempts: 1 Placement Confirmation: positive ETCO2 and breath sounds checked- equal and bilateral Tube secured with: Tape Dental Injury: Teeth and Oropharynx as per pre-operative assessment

## 2019-08-05 NOTE — Progress Notes (Signed)
Spoke with patient to follow up from [facility] and to assess navigation needs. She is having surgery today. Informed her that she would get a call about scheduling her chemo class for sometime the week of 4/26 and to call with any concerns or questions.  Patient verbalized understanding.

## 2019-08-05 NOTE — H&P (Signed)
54 yof originally from [place] currently works as Oncologist. she has no family history of breast cancer, no prior history. had episode of retinitis this summer otherwise healthy. she has no mass or dc. she was noted on screening mm to have a left breast asymmetry. she has b density breasts. on [place] this is a 1.2x1.1 cm mass. her axillary [place] is negative. 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    Past Surgical History Conni Slipper, RN; 07/28/2019 7:55 AM) Breast Biopsy  Left. Cesarean Section - Multiple   Diagnostic Studies History Conni Slipper, RN; 07/28/2019 7:55 AM) Colonoscopy  never Mammogram  within last year Pap Smear  >5 years ago  Medication History Conni Slipper, RN; 07/28/2019 7:56 AM) Medications Reconciled  Social History Conni Slipper, RN; 07/28/2019 7:55 AM) Alcohol use  Occasional alcohol use. Caffeine use  Coffee, Tea. No drug use  Tobacco use  Never smoker.  Family History Conni Slipper, RN; 07/28/2019 7:55 AM) Cancer  Father. Thyroid problems  Mother.  Pregnancy / Birth History Conni Slipper, RN; 07/28/2019 7:55 AM) Age at menarche  29 years. Age of menopause  [age] Contraceptive History  Oral contraceptives. Gravida  2 Length (months) of breastfeeding  3-6 Maternal age  66-40 Para  2 Regular periods   Other Problems Conni Slipper, RN; 07/28/2019 7:55 AM) Arthritis  Breast Cancer  Other disease, cancer, significant illness    Review of Systems Conni Slipper RN; 07/28/2019 7:55 AM) General Present- Night Sweats. Not Present- Appetite Loss, Chills, Fatigue, Fever, Weight Gain and Weight Loss. Skin Not Present- Change in Wart/Mole, Dryness, Hives, Jaundice, New Lesions, Non-Healing Wounds, Rash and Ulcer. HEENT Present- Sore Throat and Wears glasses/contact lenses. Not Present- Earache, Hearing Loss, Hoarseness, Nose Bleed, Oral Ulcers, Ringing in the Ears, Seasonal  Allergies, Sinus Pain, Visual Disturbances and Yellow Eyes. Respiratory Not Present- Bloody sputum, Chronic Cough, Difficulty Breathing, Snoring and Wheezing. Breast Present- Breast Pain. Not Present- Breast Mass, Nipple Discharge and Skin Changes. Cardiovascular Not Present- Chest Pain, Difficulty Breathing Lying Down, Leg Cramps, Palpitations, Rapid Heart Rate, Shortness of Breath and Swelling of Extremities. Gastrointestinal Not Present- Abdominal Pain, Bloating, Bloody Stool, Change in Bowel Habits, Chronic diarrhea, Constipation, Difficulty Swallowing, Excessive gas, Gets full quickly at meals, Hemorrhoids, Indigestion, Nausea, Rectal Pain and Vomiting. Female Genitourinary Not Present- Frequency, Nocturia, Painful Urination, Pelvic Pain and Urgency. Musculoskeletal Present- Back Pain. Not Present- Joint Pain, Joint Stiffness, Muscle Pain, Muscle Weakness and Swelling of Extremities. Neurological Not Present- Decreased Memory, Fainting, Headaches, Numbness, Seizures, Tingling, Tremor, Trouble walking and Weakness. Psychiatric Present- Change in Sleep Pattern. Not Present- Anxiety, Bipolar, Depression, Fearful and Frequent crying. Endocrine Not Present- Cold Intolerance, Excessive Hunger, Hair Changes, Heat Intolerance, Hot flashes and New Diabetes.   Physical Exam Rolm Bookbinder MD; 07/28/2019 2:35 PM) General Mental Status-Alert. Orientation-Oriented X3. Breast Nipples-No Discharge. Breast Lump-No Palpable Breast Mass. Lymphatic Head & Neck General Head & Neck Lymphatics: Bilateral - Description - Normal. Axillary General Axillary Region: Bilateral - Description - Normal. 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 We discussed all  of the different options for treatment for breast cancer including surgery, chemotherapy, radiation therapy, Herceptin, and antiestrogen therapy. We discussed a sentinel lymph  node biopsy as she does not appear to having lymph node involvement right now. We discussed the performance of that with injection of radioactive tracer. We discussed that there is a chance of having a positive node with a sentinel lymph node biopsy and we will await the permanent pathology to make any other first further decisions in terms of her treatment. We discussed up to a 5% risk lifetime of chronic shoulder pain as well as lymphedema associated with a sentinel lymph node biopsy. We discussed the options for treatment of the breast cancer which included lumpectomy versus a mastectomy. We discussed the performance of the lumpectomy with radioactive seed placement. We discussed a 5-10% chance of a positive margin requiring reexcision in the operating room. We also discussed that she will likely need radiation therapy if she undergoes lumpectomy. The breast cannot undergo more radiation therapy in the same breast after lumpectomy in the future. We discussed mastectomy and the postoperative care for that as well. Mastectomy can be followed by reconstruction. The decision for lumpectomy vs mastectomy has no impact on decision for chemotherapy. Most mastectomy patients will not need radiation therapy. We discussed that there is no difference in her survival whether she undergoes lumpectomy with radiation therapy or antiestrogen therapy versus a mastectomy. There is also no real difference between her recurrence in the breast. We discussed the risks of operation including bleeding, infection, possible reoperation. She understands her further therapy will be based on what her stages at the time of her operation.

## 2019-08-05 NOTE — Anesthesia Procedure Notes (Signed)
Anesthesia Regional Block: Pectoralis block   Pre-Anesthetic Checklist: ,, timeout performed, Correct Patient, Correct Site, Correct Laterality, Correct Procedure, Correct Position, site marked, Risks and benefits discussed,  Surgical consent,  Pre-op evaluation,  At surgeon's request and post-op pain management  Laterality: Left  Prep: chloraprep       Needles:  Injection technique: Single-shot  Needle Type: Echogenic Needle     Needle Length: 9cm  Needle Gauge: 21     Additional Needles:   Procedures:,,,, ultrasound used (permanent image in chart),,,,  Narrative:  Start time: 08/05/2019 11:52 AM End time: 08/05/2019 11:59 AM Injection made incrementally with aspirations every 5 mL.  Performed by: Personally  Anesthesiologist: Suzette Battiest, MD

## 2019-08-05 NOTE — Op Note (Signed)
Preoperative diagnosis:her 2 positive left breast cancer Postoperative diagnosis: saa Procedure:Right ij port placement with US guidance Left breast seed guided lumpectomy Left deep axillary sentinel node biopsy Surgeon: Dr Serita Grammes EBL: 25 cc Anesthesia: general with left pec block Complications none Drains none Specimens: 1. Left breast lumpectomy with seed and clip marked with paint 2. Left deep axillary nodes with highest count of 469 3. Additional lateral, medial, superior and anterior margins marked short superior, long lateral double deep Sponge and needle count correct times two dispo to recovery stable  Indications:53 yof was noted on screening mm to have a left breast asymmetry. she has b density breasts. on [place] this is a 1.2x1.1 cm mass. her axillary [place] is negative. 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 Was seen in [facility] and elected to proceed with lumpectomy, axillary sn biopsy and port placement.    Procedure:After informed consent was obtained the patient was taken to the operating room. She was given antibiotics. SCDs were placed. She was placed under general anesthesia without complication. She underwent a pectoral block on the left.  She was prepped and draped in the standard sterile surgical fashion. A surgical timeout was then performed.  I identified the internal jugular vein on theright side with the ultrasound. I made a small nick in the skin. I accessed the internal jugular vein with the needle under ultrasound guidance. I passed the wire. The wire was confirmed to be in position with fluoroscopy.The wire was in the vein by ultrasound as well.I then infiltrated Marcaine below the clavicle on theleftside. Imade anincision and developed a subcutaneous pocket for the port. I then tunneled between the port site as well as the insertion site. I brought the line through this.  I then placed the dilator under fluoroscopic guidance over the wire. I removedthe wire andthe dilator. I then placed the line into the sheath. The sheath was then removed. I pulled the line back to be in the distal vena cava. I then hooked this up to the port. This was placed in the pocket and sutured in place with 2-0 Prolene suture. I then closed this with 3-0 Vicryl and 4-0 Monocryl. Glue was placed.   The tumor was in theupper outer right breast  and I infiltrated marcaine and then made aperiareolar incision to hide the scar later.I then used the neoprobe and lighted retractor to remove the breast cancer with an attempt to get a clear margin. Mammogram confirmed removal of the seed and clip.I did remove additional margins.I then obtained hemostasis. I then placed clips in the cavity.I pulled the breast tissue together with 2-0 vicryl. The skin was closed with 3-0 vicryl and 4-0 monocryl. Glue and steristrips were placed  I then made a curvilinear incision below the axillary hairline. There was radioactivity noted.I then entered the axilla. I removed what appear to be a couple sentinel nodes with highest radioactivity as above. There was no background radioactivity. Hemostasis was obtained. I closed the axillary fasciawith 2-0 Vicryl. I then closed the dermis with 3-0 Vicryl and the skin with 4-0 Monocryl. Glue and Steri-Strips were applied. She tolerated this well was extubated and transferred to recovery stable.

## 2019-08-06 NOTE — Anesthesia Postprocedure Evaluation (Signed)
Anesthesia Post Note  Patient: [name]  Procedure(s) Performed: LEFT BREAST LUMPECTOMY WITH RADIOACTIVE SEED AND LEFT AXILLARY SENTINEL LYMPH NODE BIOPSY (Left Breast) INSERTION PORT-A-CATH WITH ULTRASOUND GUIDANCE (Right Chest)     Patient location during evaluation: PACU Anesthesia Type: General Level of consciousness: awake and alert Pain management: pain level controlled Vital Signs Assessment: post-procedure vital signs reviewed and stable Respiratory status: spontaneous breathing, nonlabored ventilation, respiratory function stable and patient connected to nasal cannula oxygen Cardiovascular status: blood pressure returned to baseline and stable Postop Assessment: no apparent nausea or vomiting Anesthetic complications: no    Last Vitals:  Vitals:   08/05/19 1422 08/05/19 1437  BP: 118/85 (!) 117/59  Pulse: 96 96  Resp: 14 14  Temp:  36.7 C  SpO2: 100% 96%    Last Pain:  Vitals:   08/05/19 1437  TempSrc:   PainSc: 0-No pain                 Tiajuana Amass

## 2019-08-09 LAB — SURGICAL PATHOLOGY

## 2019-08-10 ENCOUNTER — Encounter: Payer: Self-pay | Admitting: *Deleted

## 2019-08-11 NOTE — Progress Notes (Signed)
 Patient Care Team: Fulp, Cammie, MD as PCP - General (Family Medicine) Stuart, Genavive C, RN as Oncology Nurse Navigator Martini, Keisha N, RN as Oncology Nurse Navigator Wakefield, Matthew, MD as Consulting Physician (General Surgery) Gudena, Vinay, MD as Consulting Physician (Hematology and Oncology) Moody, John, MD as Consulting Physician (Radiation Oncology)  DIAGNOSIS:    ICD-10-CM   1. Malignant neoplasm of upper-outer quadrant of left breast in female, estrogen receptor negative (HCC)  C50.412    Z17.1     SUMMARY OF ONCOLOGIC HISTORY: Oncology History  Malignant neoplasm of upper-outer quadrant of left breast in female, estrogen receptor negative (HCC)  07/27/2019 Initial Diagnosis   Screening mammogram showed a left breast asymmetry. Diagnostic mammogram and US showed a 1.2cm left breast mass, 12:30 position, no left axillary adenopathy. 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   08/05/2019 Surgery   Left lumpectomy: Grade 3 IDC, 2 cm with high-grade DCIS, lymphovascular invasion was identified, 1 lymph node negative, margins negative, ER 0%, PR 0%, HER-2 positive, Ki-67 90%     CHIEF COMPLIANT: Follow-up s/p lumpectomy to review pathology  INTERVAL HISTORY: [name] is a 54 y.o. with above-mentioned history of left breast cancer. She underwent a lumpectomy on 08/15/19 with Dr. 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 She presents to the clinic today to discuss the pathology report and further treatment.  She is recovering very well from the recent surgery with many minimal discomfort especially under the arm.  ALLERGIES:  has No Known Allergies.  MEDICATIONS:  Current Outpatient Medications  Medication Sig Dispense Refill  . ibuprofen (ADVIL) 200 MG tablet Take 400-600 mg by mouth every 8 (eight) hours as needed for moderate pain.      . Multiple Vitamin (MULTIVITAMIN WITH MINERALS) TABS tablet Take 1 tablet by mouth daily. One-A-Day for Women 50+    . oxyCODONE (OXY IR/ROXICODONE) 5 MG immediate release tablet Take 1 tablet (5 mg total) by mouth every 6 (six) hours as needed for moderate pain, severe pain or breakthrough pain. 12 tablet 0   No current facility-administered medications for this visit.    PHYSICAL EXAMINATION: ECOG PERFORMANCE STATUS: 1 - Symptomatic but completely ambulatory  Vitals:   08/12/19 1421  BP: 137/84  Pulse: (!) 116  Resp: 17  Temp: 98.5 F (36.9 C)  SpO2: 99%   Filed Weights   08/12/19 1421  Weight: 151 lb 14.4 oz (68.9 kg)    LABORATORY DATA:  I have reviewed the data as listed CMP Latest Ref Rng & Units 07/28/2019 11/11/2018 11/10/2018  Glucose 70 - 99 mg/dL 97 129(H) 114(H)  BUN 6 - 20 mg/dL 13 12 13  Creatinine 0.44 - 1.00 mg/dL 0.76 0.81 0.84  Sodium 135 - 145 mmol/L 144 138 139  Potassium 3.5 - 5.1 mmol/L 4.6 4.2 3.8  Chloride 98 - 111 mmol/L 109 104 105  CO2 22 - 32 mmol/L 27 24 25  Calcium 8.9 - 10.3 mg/dL 9.3 8.6(L) 8.6(L)  Total Protein 6.5 - 8.1 g/dL 7.1 - -  Total Bilirubin 0.3 - 1.2 mg/dL 0.5 - -  Alkaline Phos 38 - 126 U/L 87 - -  AST 15 - 41 U/L 15 - -  ALT 0 - 44 U/L 17 - -    Lab Results  Component Value Date   WBC 5.1 07/28/2019   HGB 14.5   07/28/2019   HCT 43.1 07/28/2019   MCV 94.1 07/28/2019   PLT 254 07/28/2019   NEUTROABS 2.8 07/28/2019    ASSESSMENT & PLAN:  Malignant neoplasm of upper-outer quadrant of left breast in female, estrogen receptor negative ([place]) 07/27/2019:Screening mammogram showed a left breast asymmetry. Diagnostic mammogram and US showed a 1.2cm left breast mass, 12:30 position, no left axillary adenopathy. 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 T1c N0 stage Ia clinical stage  Recommendation: 1.  Breast conserving surgery with sentinel lymph node biopsy 2.  Adjuvant chemotherapy with Taxol  Herceptin followed by Herceptin maintenance for 1 year 3.  Adjuvant radiation therapy --------------------------------------------------------------------------------------------------------------------------------------------------------------- 08/05/2019:Left lumpectomy: Grade 3 IDC, 2 cm with high-grade DCIS, lymphovascular invasion was identified, 1 lymph node negative, margins negative, ER 0%, PR 0%, HER-2 positive, Ki-67 90%  Pathology counseling: I discussed the final pathology report of the patient provided  a copy of this report. I discussed the margins as well as lymph node surgeries. We also discussed the final staging along with previously performed ER/PR and HER-2/neu testing.  The treatment plan remains the same. Return to clinic in 3 weeks to start Taxol with Herceptin. I counseled her about upbeat clinical trial. We also counseled her about neuropathy clinical trial.   No orders of the defined types were placed in this encounter.  The patient has a good understanding of the overall plan. she agrees with it. she will call with any problems that may develop before the next visit here.  Total time spent: 50 mins including face to face time and time spent for planning, charting and coordination of care  Nicholas Lose, MD 08/12/2019  I, Cloyde Reams Dorshimer, am acting as scribe for Dr. Nicholas Lose.  I have reviewed the above documentation for accuracy and completeness, and I agree with the above.

## 2019-08-12 ENCOUNTER — Encounter: Payer: Self-pay | Admitting: *Deleted

## 2019-08-12 ENCOUNTER — Other Ambulatory Visit: Payer: Self-pay

## 2019-08-12 ENCOUNTER — Inpatient Hospital Stay: Payer: BC Managed Care – PPO | Admitting: Hematology and Oncology

## 2019-08-12 VITALS — BP 137/84 | HR 116 | Temp 98.5°F | Resp 17 | Ht 60.0 in | Wt 151.9 lb

## 2019-08-12 DIAGNOSIS — Z171 Estrogen receptor negative status [ER-]: Secondary | ICD-10-CM | POA: Diagnosis not present

## 2019-08-12 DIAGNOSIS — C50412 Malignant neoplasm of upper-outer quadrant of left female breast: Secondary | ICD-10-CM

## 2019-08-12 DIAGNOSIS — M1990 Unspecified osteoarthritis, unspecified site: Secondary | ICD-10-CM | POA: Diagnosis not present

## 2019-08-12 MED ORDER — ONDANSETRON HCL 8 MG PO TABS: 8.0000 mg | ORAL_TABLET | Freq: Two times a day (BID) | ORAL | 1 refills | Status: DC | PRN

## 2019-08-12 MED ORDER — LIDOCAINE-PRILOCAINE 2.5-2.5 % EX CREA: TOPICAL_CREAM | CUTANEOUS | 3 refills | Status: DC

## 2019-08-12 MED ORDER — LORAZEPAM 0.5 MG PO TABS: 0.5000 mg | ORAL_TABLET | Freq: Every evening | ORAL | 1 refills | Status: DC | PRN

## 2019-08-12 MED ORDER — PROCHLORPERAZINE MALEATE 10 MG PO TABS: 10.0000 mg | ORAL_TABLET | Freq: Four times a day (QID) | ORAL | 1 refills | Status: DC | PRN

## 2019-08-12 NOTE — Progress Notes (Signed)
START ON PATHWAY REGIMEN - Breast     Cycle 1: A cycle is 7 days:     Trastuzumab-xxxx      Paclitaxel    Cycles 2 through 12: A cycle is every 7 days:     Trastuzumab-xxxx      Paclitaxel    Cycles 13 through 25: A cycle is every 21 days:     Trastuzumab-xxxx   **Always confirm dose/schedule in your pharmacy ordering system**  Patient Characteristics: Postoperative without Neoadjuvant Therapy (Pathologic Staging), Invasive Disease, Adjuvant Therapy, HER2 Positive, ER Negative/Unknown, Node Negative, pT1c, pN0/N41m Therapeutic Status: Postoperative without Neoadjuvant Therapy (Pathologic Staging) AJCC Grade: G3 AJCC N Category: pN0 AJCC M Category: cM0 ER Status: Negative (-) AJCC 8 Stage Grouping: IA HER2 Status: Positive (+) Oncotype Dx Recurrence Score: Not Appropriate AJCC T Category: pT1c PR Status: Negative (-) Intent of Therapy: Curative Intent, Discussed with Patient

## 2019-08-12 NOTE — Assessment & Plan Note (Signed)
07/27/2019:Screening mammogram showed a left breast asymmetry. Diagnostic mammogram and US showed a 1.2cm left breast mass, 12:30 position, no left axillary adenopathy. 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 T1c N0 stage Ia clinical stage  Recommendation: 1.  Breast conserving surgery with sentinel lymph node biopsy 2.  Adjuvant chemotherapy with Taxol Herceptin followed by Herceptin maintenance for 1 year 3.  Adjuvant radiation therapy --------------------------------------------------------------------------------------------------------------------------------------------------------------- 08/05/2019:Left lumpectomy: Grade 3 IDC, 2 cm with high-grade DCIS, lymphovascular invasion was identified, 1 lymph node negative, margins negative, ER 0%, PR 0%, HER-2 positive, Ki-67 90%  Pathology counseling: I discussed the final pathology report of the patient provided  a copy of this report. I discussed the margins as well as lymph node surgeries. We also discussed the final staging along with previously performed ER/PR and HER-2/neu testing.  The treatment plan remains the same. Return to clinic in 3 weeks to start Taxol with Herceptin. I counseled her about upbeat clinical trial. We also counseled her about neuropathy clinical trial.

## 2019-08-13 ENCOUNTER — Telehealth: Payer: Self-pay | Admitting: Medical Oncology

## 2019-08-13 ENCOUNTER — Telehealth: Payer: Self-pay | Admitting: Hematology and Oncology

## 2019-08-13 NOTE — Telephone Encounter (Signed)
Referral Dr. Lindi Adie referred patient to the [id number] and [id number] studies. I called patient to introduce myself and inform her that I was following up with Dr. Geralyn Flash referral and to see if she had any questions. Patient confirms that MD spoke to her about both studies. I informed patient that participation is voluntary and asked her if it was alright for me to send her information, to both studies, via her e-mail, so that she has time to review the information and decide if she wishes to participate in either. Patient confirmed it was, and confirmed her e-mail address with me. Patient understands that the consent forms are for information purposes only and no need to sign them. I thanked patient for her time and interest and encouraged her to call Dr. Lindi Adie or me, with any questions she may have. Patient denies any questions at this time.  Maxwell Marion, RN, BSN, [facility] Clinical [facility] 08/13/2019 1:39 PM

## 2019-08-13 NOTE — Telephone Encounter (Signed)
Scheduled per 04/22 los, patient has been called and notified regarding upcoming appointments.

## 2019-08-16 ENCOUNTER — Encounter: Payer: Self-pay | Admitting: *Deleted

## 2019-08-17 ENCOUNTER — Telehealth: Payer: Self-pay | Admitting: Medical Oncology

## 2019-08-17 NOTE — Telephone Encounter (Signed)
Referral Follow up call to patient regarding study information sent to her via e-mail, last week Friday. Inquired with patient as to whether she had any questions regarding either study, UPBEAT and [id number], information provided to her. Patient states she read over the study consent forms and declines having any questions. Patient states she does not wish to participate in the UPBEAT study, but would like to participate in the S1714 (neuropathy) study. Patient will be in clinic tomorrow for chemo-education class and patient and I made plans to meet to review the study consent form, answer any questions she may have and sign for study participation, should she still be interested after we review information together, after completion of the class. Patient thanked for her time and interest in study and was encouraged to call in the meantime with questions.  Chemo-education nurse notified to call me when patient's appointment is complete.   Patient inquired about her FMLA papers she submitted today, she informed me that she had written the incorrect date on them and asked if I could find out where the papers would be. I informed patient that I will try to locate them and have them returned to her tomorrow to correct, when she is in clinic.  I spoke with RN Ros, who handles the FMLA documents and informed her of patient's request. Per Ros, she will have the papers for patient when in clinic tomorrow.  Maxwell Marion, RN, BSN, [facility] Clinical Research 08/17/2019 4:33 PM

## 2019-08-18 ENCOUNTER — Other Ambulatory Visit: Payer: Self-pay

## 2019-08-18 ENCOUNTER — Inpatient Hospital Stay: Payer: BC Managed Care – PPO

## 2019-08-18 ENCOUNTER — Encounter: Payer: Self-pay | Admitting: Medical Oncology

## 2019-08-18 ENCOUNTER — Telehealth: Payer: Self-pay

## 2019-08-18 DIAGNOSIS — C50412 Malignant neoplasm of upper-outer quadrant of left female breast: Secondary | ICD-10-CM

## 2019-08-18 DIAGNOSIS — Z171 Estrogen receptor negative status [ER-]: Secondary | ICD-10-CM

## 2019-08-18 NOTE — Telephone Encounter (Signed)
Nutrition Assessment  Reason for Assessment:  Pt attended Breast Clinic on 07/28/19 and was given nutrition packet by nurse navigator  ASSESSMENT:  54 year old female s/p lumpectomy.  Planning adjuvant chemotherapy with taxol and herceptin.  Past medical history reviewed.   Spoke with patient via phone to introduce self and service at [facility].  Patient reports normal appetite  Medications:  reviewed  Labs: reviewed  Anthropometrics:   Height: 60 inches Weight: 151 lb BMI: 29   NUTRITION DIAGNOSIS: Food and nutrition related knowledge deficit related to new diagnosis of breast cancer as evidenced by no prior need for nutrition related information.  INTERVENTION:   Discussed briefly packet of information regarding nutritional tips for breast cancer patients.  Questions answered.  Teachback method used.  Contact information provided and patient knows to contact me with questions/concerns.    MONITORING, EVALUATION, and GOAL: Pt will consume a healthy plant based diet to maintain lean body mass throughout treatment.   Verley Pariseau B. Zenia Resides, [place], [place] Registered Dietitian [phone] (pager)

## 2019-08-18 NOTE — Progress Notes (Signed)
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 Before we started, I asked patient if she had any questions regarding the study and patient denied any. Patient confirms reading the study consent form that I had e-mailed to her last week. Today, patient and I together, reviewed the study consent form, page by page. Patient confirms understanding that participation is voluntary and she can withdraw from study at any time, should she choose to do so. Patient confirms understanding to the purpose of this study, any risks and/or benefits, the assessments involved, including blood collection, as well as, how her medical information will be protected. Patient opted not to participate in the optional blood sample collection. After all patient's questions were answered to her satisfaction, patient proceeded to sigh, date and time the study informed consent form as well as the study authorization form where indicated on Protocol Version Date 02/28/19. I confirmed patient's smoking history, and per patient, she has never smoked. Patient also confirms no history of falls in the last six months. Per patient, she states that she has numbness and tingling in bilateral fingers related to carpal tunnel.  Patient was thanked for her time today and for her participation. Patient was informed that I will see her on her first day of treatment to collect the baseline assessments, prior to her start of chemotherapy. Patient was encouraged to call Dr. Lindi Adie or myself with any questions or concerns she may have.  Patient signed and dated [facility] ROI today.  Approximately 40 minutes was spent consenting patient to the study.  Patient was provided with a copy of her signed documents as well as the Clinical Trials Information pamphlet and  my contact information.  Maxwell Marion, RN, BSN, [facility] Clinical Research 08/18/2019 2:22 PM

## 2019-08-19 ENCOUNTER — Other Ambulatory Visit: Payer: BC Managed Care – PPO

## 2019-08-24 NOTE — Progress Notes (Signed)
Pharmacist Chemotherapy Monitoring - Initial Assessment    Anticipated start date: 08/30/19   Regimen:  . Are orders appropriate based on the patient's diagnosis, regimen, and cycle? Yes . Does the plan date match the patient's scheduled date? Yes . Is the sequencing of drugs appropriate? Yes . Are the premedications appropriate for the patient's regimen? Yes . Prior Authorization for treatment is: Pending o If applicable, is the correct biosimilar selected based on the patient's insurance? yes  Organ Function and Labs: Marland Kitchen Are dose adjustments needed based on the patient's renal function, hepatic function, or hematologic function? Yes . Are appropriate labs ordered prior to the start of patient's treatment? Yes . Other organ system assessment, if indicated: trastuzumab: Echo/ MUGA . The following baseline labs, if indicated, have been ordered: N/A  Dose Assessment: . Are the drug doses appropriate? Yes . Are the following correct: o Drug concentrations Yes o IV fluid compatible with drug Yes o Administration routes Yes o Timing of therapy Yes . If applicable, does the patient have documented access for treatment and/or plans for port-a-cath placement? yes . If applicable, have lifetime cumulative doses been properly documented and assessed? not applicable Lifetime Dose Tracking  No doses have been documented on this patient for the following tracked chemicals: Doxorubicin, Epirubicin, Idarubicin, Daunorubicin, Mitoxantrone, Bleomycin, Oxaliplatin, Carboplatin, Liposomal Doxorubicin  o   Toxicity Monitoring/Prevention: . The patient has the following take home antiemetics prescribed: Ondansetron, Prochlorperazine and Lorazepam . The patient has the following take home medications prescribed: N/A . Medication allergies and previous infusion related reactions, if applicable, have been reviewed and addressed. Yes . The patient's current medication list has been assessed for drug-drug  interactions with their chemotherapy regimen. no significant drug-drug interactions were identified on review.  Order Review: . Are the treatment plan orders signed? Yes . Is the patient scheduled to see a provider prior to their treatment? Yes  I verify that I have reviewed each item in the above checklist and answered each question accordingly.  [name] 08/24/2019 12:41 PM

## 2019-08-25 ENCOUNTER — Other Ambulatory Visit: Payer: Self-pay | Admitting: Hematology and Oncology

## 2019-08-25 DIAGNOSIS — C50412 Malignant neoplasm of upper-outer quadrant of left female breast: Secondary | ICD-10-CM

## 2019-08-27 ENCOUNTER — Other Ambulatory Visit: Payer: Self-pay | Admitting: Medical Oncology

## 2019-08-27 DIAGNOSIS — C50412 Malignant neoplasm of upper-outer quadrant of left female breast: Secondary | ICD-10-CM

## 2019-08-29 NOTE — Progress Notes (Signed)
Patient Care Team: Antony Blackbird, MD as PCP - General (Family Medicine) Mauro Kaufmann, RN as Oncology Nurse Navigator Rockwell Germany, RN as Oncology Nurse Navigator Rolm Bookbinder, MD as Consulting Physician (General Surgery) Nicholas Lose, MD as Consulting Physician (Hematology and Oncology) Kyung Rudd, MD as Consulting Physician (Radiation Oncology)  DIAGNOSIS:    ICD-10-CM   1. Malignant neoplasm of upper-outer quadrant of left breast in female, estrogen receptor negative ([place])  C50.412    Z17.1     SUMMARY OF ONCOLOGIC HISTORY: Oncology History  Malignant neoplasm of upper-outer quadrant of left breast in female, estrogen receptor negative ([place])  07/27/2019 Initial Diagnosis   Screening mammogram showed a left breast asymmetry. Diagnostic mammogram and US showed a 1.2cm left breast mass, 12:30 position, no left axillary adenopathy. 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   08/05/2019 Surgery   Left lumpectomy: Grade 3 IDC, 2 cm with high-grade DCIS, lymphovascular invasion was identified, 1 lymph node negative, margins negative, ER 0%, PR 0%, HER-2 positive, Ki-67 90%   08/05/2019 Cancer Staging   Staging form: Breast, AJCC 8th Edition - Pathologic stage from 08/05/2019: Stage IIA (pT2, pN0, cM0, G3, ER-, PR-, HER2+) - Signed by Gardenia Phlegm, NP on 08/18/2019   08/30/2019 -  Chemotherapy   The patient had PACLitaxel (TAXOL) 138 mg in sodium chloride 0.9 % 250 mL chemo infusion (</= '80mg'$ /m2), 80 mg/m2 = 138 mg, Intravenous,  Once, 1 of 3 cycles trastuzumab-dkst (OGIVRI) 273 mg in sodium chloride 0.9 % 250 mL chemo infusion, 4 mg/kg = 273 mg, Intravenous,  Once, 1 of 16 cycles  for chemotherapy treatment.      CHIEF COMPLIANT: Cycle 1 Taxol Herceptin  INTERVAL HISTORY: [name] is a 54 y.o. with above-mentioned history of left breast cancer who underwent a lumpectomy and is currently on adjuvant chemotherapy with Taxol  Herceptin. She is a participant in the neuropathy research study. She presents to the clinic today for treatment.   ALLERGIES:  has No Known Allergies.  MEDICATIONS:  Current Outpatient Medications  Medication Sig Dispense Refill  . ibuprofen (ADVIL) 200 MG tablet Take 400-600 mg by mouth every 8 (eight) hours as needed for moderate pain.     Marland Kitchen lidocaine-prilocaine (EMLA) cream Apply to affected area once 30 g 3  . LORazepam (ATIVAN) 0.5 MG tablet Take 1 tablet (0.5 mg total) by mouth at bedtime as needed (Nausea or vomiting). 30 tablet 1  . Multiple Vitamin (MULTIVITAMIN WITH MINERALS) TABS tablet Take 1 tablet by mouth daily. One-A-Day for Women 50+    . ondansetron (ZOFRAN) 8 MG tablet Take 1 tablet (8 mg total) by mouth 2 (two) times daily as needed (Nausea or vomiting). 30 tablet 1  . prochlorperazine (COMPAZINE) 10 MG tablet TAKE 1 TABLET(10 MG) BY MOUTH EVERY 6 HOURS AS NEEDED FOR NAUSEA OR VOMITING 30 tablet 1   No current facility-administered medications for this visit.   Facility-Administered Medications Ordered in Other Visits  Medication Dose Route Frequency Provider Last Rate Last Admin  . heparin lock flush 100 unit/mL  500 Units Intracatheter Once PRN Nicholas Lose, MD      . PACLitaxel (TAXOL) 138 mg in sodium chloride 0.9 % 250 mL chemo infusion (</= '80mg'$ /m2)  80 mg/m2 (Treatment Plan Recorded) Intravenous Once Nicholas Lose, MD      . sodium chloride flush (NS) 0.9 % injection 10 mL  10 mL Intracatheter PRN Nicholas Lose, MD      .  trastuzumab-dkst (OGIVRI) 273 mg in sodium chloride 0.9 % 250 mL chemo infusion  4 mg/kg (Treatment Plan Recorded) Intravenous Once Nicholas Lose, MD 175.3 mL/hr at 08/30/19 1055 273 mg at 08/30/19 1055    PHYSICAL EXAMINATION: ECOG PERFORMANCE STATUS: 0 - Asymptomatic  Vitals:   08/30/19 0908  BP: 132/71  Pulse: 98  Resp: 17  Temp: 98.2 F (36.8 C)  SpO2: 98%   Filed Weights   08/30/19 0908  Weight: 153 lb 12.8 oz (69.8 kg)     LABORATORY DATA:  I have reviewed the data as listed CMP Latest Ref Rng & Units 08/30/2019 07/28/2019 11/11/2018  Glucose 70 - 99 mg/dL 90 97 129(H)  BUN 6 - 20 mg/dL _0 Creatinine 0.44 - 1.00 mg/dL 0.71 0.76 0.81  Sodium 135 - 145 mmol/L 143 144 138  Potassium 3.5 - 5.1 mmol/L 4.2 4.6 4.2  Chloride 98 - 111 mmol/L 109 109 104  CO2 22 - 32 mmol/L _1 Calcium 8.9 - 10.3 mg/dL 9.0 9.3 8.6(L)  Total Protein 6.5 - 8.1 g/dL 6.9 7.1 -  Total Bilirubin 0.3 - 1.2 mg/dL 0.4 0.5 -  Alkaline Phos 38 - 126 U/L 92 87 -  AST 15 - 41 U/L 17 15 -  ALT 0 - 44 U/L 17 17 -    Lab Results  Component Value Date   WBC 4.2 08/30/2019   HGB 14.1 08/30/2019   HCT 42.5 08/30/2019   MCV 93.4 08/30/2019   PLT 240 08/30/2019   NEUTROABS 2.0 08/30/2019    ASSESSMENT & PLAN:  Malignant neoplasm of upper-outer quadrant of left breast in female, estrogen receptor negative ([place]) 07/27/2019:Screening mammogram showed a left breast asymmetry. Diagnostic mammogram and US showed a 1.2cm left breast mass, 12:30 position, no left axillary adenopathy. 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 T1c N0 stage Ia clinical stage  Recommendation: 1.08/05/2019:Left lumpectomy: Grade 3 IDC, 2 cm with high-grade DCIS, lymphovascular invasion was identified, 1 lymph node negative, margins negative, ER 0%, PR 0%, HER-2 positive, Ki-67 90% 2.Adjuvant chemotherapy with Taxol Herceptin followed by Herceptin maintenance for 1 year started 08/30/2019 3.Adjuvant radiation therapy Patient is participating in [place] neuropathy clinical trial --------------------------------------------------------------------------------------------------------------------------------------------------------------- Current treatment: Cycle 1 Taxol Herceptin Echocardiogram: 08/02/2019: EF 60 to 65% Labs reviewed, chemo education completed, chemo consent obtained. Return to clinic in 1 week for  toxicity check and for cycle 2   No orders of the defined types were placed in this encounter.  The patient has a good understanding of the overall plan. she agrees with it. she will call with any problems that may develop before the next visit here.  Total time spent: 30 mins including face to face time and time spent for planning, charting and coordination of care  Nicholas Lose, MD 08/30/2019  I, Cloyde Reams Dorshimer, am acting as scribe for Dr. Nicholas Lose.  I have reviewed the above documentation for accuracy and completeness, and I agree with the above.

## 2019-08-30 ENCOUNTER — Inpatient Hospital Stay: Payer: BC Managed Care – PPO | Attending: Hematology and Oncology

## 2019-08-30 ENCOUNTER — Inpatient Hospital Stay: Payer: BC Managed Care – PPO | Admitting: Hematology and Oncology

## 2019-08-30 ENCOUNTER — Other Ambulatory Visit: Payer: Self-pay

## 2019-08-30 ENCOUNTER — Encounter: Payer: Self-pay | Admitting: *Deleted

## 2019-08-30 ENCOUNTER — Encounter: Payer: Self-pay | Admitting: Medical Oncology

## 2019-08-30 ENCOUNTER — Inpatient Hospital Stay: Payer: BC Managed Care – PPO

## 2019-08-30 ENCOUNTER — Other Ambulatory Visit: Payer: Self-pay | Admitting: Medical Oncology

## 2019-08-30 VITALS — BP 113/71 | HR 103 | Temp 98.8°F | Resp 18

## 2019-08-30 DIAGNOSIS — Z5111 Encounter for antineoplastic chemotherapy: Secondary | ICD-10-CM | POA: Insufficient documentation

## 2019-08-30 DIAGNOSIS — Z5112 Encounter for antineoplastic immunotherapy: Secondary | ICD-10-CM | POA: Diagnosis not present

## 2019-08-30 DIAGNOSIS — Z79899 Other long term (current) drug therapy: Secondary | ICD-10-CM | POA: Insufficient documentation

## 2019-08-30 DIAGNOSIS — Z171 Estrogen receptor negative status [ER-]: Secondary | ICD-10-CM | POA: Diagnosis not present

## 2019-08-30 DIAGNOSIS — C50412 Malignant neoplasm of upper-outer quadrant of left female breast: Secondary | ICD-10-CM | POA: Diagnosis present

## 2019-08-30 DIAGNOSIS — Z006 Encounter for examination for normal comparison and control in clinical research program: Secondary | ICD-10-CM

## 2019-08-30 LAB — CBC WITH DIFFERENTIAL (CANCER CENTER ONLY)
Abs Immature Granulocytes: 0.01 10*3/uL (ref 0.00–0.07)
Basophils Absolute: 0 10*3/uL (ref 0.0–0.1)
Basophils Relative: 0 %
Eosinophils Absolute: 0.1 10*3/uL (ref 0.0–0.5)
Eosinophils Relative: 2 %
HCT: 42.5 % (ref 36.0–46.0)
Hemoglobin: 14.1 g/dL (ref 12.0–15.0)
Immature Granulocytes: 0 %
Lymphocytes Relative: 40 %
Lymphs Abs: 1.7 10*3/uL (ref 0.7–4.0)
MCH: 31 pg (ref 26.0–34.0)
MCHC: 33.2 g/dL (ref 30.0–36.0)
MCV: 93.4 fL (ref 80.0–100.0)
Monocytes Absolute: 0.4 10*3/uL (ref 0.1–1.0)
Monocytes Relative: 10 %
Neutro Abs: 2 10*3/uL (ref 1.7–7.7)
Neutrophils Relative %: 48 %
Platelet Count: 240 10*3/uL (ref 150–400)
RBC: 4.55 MIL/uL (ref 3.87–5.11)
RDW: 11.7 % (ref 11.5–15.5)
WBC Count: 4.2 10*3/uL (ref 4.0–10.5)
nRBC: 0 % (ref 0.0–0.2)

## 2019-08-30 LAB — CMP (CANCER CENTER ONLY)
ALT: 17 U/L (ref 0–44)
AST: 17 U/L (ref 15–41)
Albumin: 3.7 g/dL (ref 3.5–5.0)
Alkaline Phosphatase: 92 U/L (ref 38–126)
Anion gap: 11 (ref 5–15)
BUN: 14 mg/dL (ref 6–20)
CO2: 23 mmol/L (ref 22–32)
Calcium: 9 mg/dL (ref 8.9–10.3)
Chloride: 109 mmol/L (ref 98–111)
Creatinine: 0.71 mg/dL (ref 0.44–1.00)
GFR, Est AFR Am: >60 mL/min (ref >60)
GFR, Estimated: >60 mL/min (ref >60)
Glucose, Bld: 90 mg/dL (ref 70–99)
Potassium: 4.2 mmol/L (ref 3.5–5.1)
Sodium: 143 mmol/L (ref 135–145)
Total Bilirubin: 0.4 mg/dL (ref 0.3–1.2)
Total Protein: 6.9 g/dL (ref 6.5–8.1)

## 2019-08-30 LAB — RESEARCH LABS

## 2019-08-30 MED ORDER — HEPARIN SOD (PORK) LOCK FLUSH 100 UNIT/ML IV SOLN
500.0000 [IU] | Freq: Once | INTRAVENOUS | Status: AC | PRN
  Administered 2019-08-30: 500 [IU]
  Filled 2019-08-30: qty 5

## 2019-08-30 MED ORDER — FAMOTIDINE IN NACL 20-0.9 MG/50ML-% IV SOLN
INTRAVENOUS | Status: AC
  Filled 2019-08-30: qty 50

## 2019-08-30 MED ORDER — SODIUM CHLORIDE 0.9 % IV SOLN
80.0000 mg/m2 | Freq: Once | INTRAVENOUS | Status: AC
  Administered 2019-08-30: 138 mg via INTRAVENOUS
  Filled 2019-08-30: qty 23

## 2019-08-30 MED ORDER — SODIUM CHLORIDE 0.9% FLUSH
10.0000 mL | INTRAVENOUS | Status: DC | PRN
  Administered 2019-08-30: 10 mL
  Filled 2019-08-30: qty 10

## 2019-08-30 MED ORDER — TRASTUZUMAB-DKST CHEMO 150 MG IV SOLR
4.0000 mg/kg | Freq: Once | INTRAVENOUS | Status: AC
  Administered 2019-08-30: 273 mg via INTRAVENOUS
  Filled 2019-08-30: qty 13

## 2019-08-30 MED ORDER — SODIUM CHLORIDE 0.9 % IV SOLN
10.0000 mg | Freq: Once | INTRAVENOUS | Status: AC
  Administered 2019-08-30: 10 mg via INTRAVENOUS
  Filled 2019-08-30: qty 10

## 2019-08-30 MED ORDER — ACETAMINOPHEN 325 MG PO TABS
ORAL_TABLET | ORAL | Status: AC
  Filled 2019-08-30: qty 2

## 2019-08-30 MED ORDER — FAMOTIDINE IN NACL 20-0.9 MG/50ML-% IV SOLN
20.0000 mg | Freq: Once | INTRAVENOUS | Status: AC
  Administered 2019-08-30: 20 mg via INTRAVENOUS

## 2019-08-30 MED ORDER — SODIUM CHLORIDE 0.9 % IV SOLN
Freq: Once | INTRAVENOUS | Status: AC
  Filled 2019-08-30: qty 250

## 2019-08-30 MED ORDER — DIPHENHYDRAMINE HCL 50 MG/ML IJ SOLN
25.0000 mg | Freq: Once | INTRAMUSCULAR | Status: AC
  Administered 2019-08-30: 25 mg via INTRAVENOUS

## 2019-08-30 MED ORDER — DIPHENHYDRAMINE HCL 50 MG/ML IJ SOLN
INTRAMUSCULAR | Status: AC
  Filled 2019-08-30: qty 1

## 2019-08-30 MED ORDER — ACETAMINOPHEN 325 MG PO TABS
650.0000 mg | ORAL_TABLET | Freq: Once | ORAL | Status: AC
  Administered 2019-08-30: 650 mg via ORAL

## 2019-08-30 NOTE — Assessment & Plan Note (Signed)
07/27/2019:Screening mammogram showed a left breast asymmetry. Diagnostic mammogram and US showed a 1.2cm left breast mass, 12:30 position, no left axillary adenopathy. 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 T1c N0 stage Ia clinical stage  Recommendation: 1.08/05/2019:Left lumpectomy: Grade 3 IDC, 2 cm with high-grade DCIS, lymphovascular invasion was identified, 1 lymph node negative, margins negative, ER 0%, PR 0%, HER-2 positive, Ki-67 90% 2.Adjuvant chemotherapy with Taxol Herceptin followed by Herceptin maintenance for 1 year started 08/30/2019 3.Adjuvant radiation therapy --------------------------------------------------------------------------------------------------------------------------------------------------------------- Current treatment: Cycle 1 Taxol Herceptin Echocardiogram: 08/02/2019: EF 60 to 65% Labs reviewed, chemo education completed, chemo consent obtained. Return to clinic in 1 week for toxicity check and for cycle 2  

## 2019-08-30 NOTE — Patient Instructions (Signed)
[facility] Discharge Instructions for Patients Receiving Chemotherapy  Today you received the following chemotherapy agents: Trastuzumab, paclitaxel  To help prevent nausea and vomiting after your treatment, we encourage you to take your nausea medication as prescribed by your physician.    If you develop nausea and vomiting that is not controlled by your nausea medication, call the clinic.   BELOW ARE SYMPTOMS THAT SHOULD BE REPORTED IMMEDIATELY:  *FEVER GREATER THAN 100.5 F  *CHILLS WITH OR WITHOUT FEVER  NAUSEA AND VOMITING THAT IS NOT CONTROLLED WITH YOUR NAUSEA MEDICATION  *UNUSUAL SHORTNESS OF BREATH  *UNUSUAL BRUISING OR BLEEDING  TENDERNESS IN MOUTH AND THROAT WITH OR WITHOUT PRESENCE OF ULCERS  *URINARY PROBLEMS  *BOWEL PROBLEMS  UNUSUAL RASH Items with * indicate a potential emergency and should be followed up as soon as possible.  Feel free to call the clinic should you have any questions or concerns. The clinic phone number is [phone].  Please show the [facility] at check-in to the Emergency Department and triage nurse.   Trastuzumab injection for infusion What is this medicine? TRASTUZUMAB (tras TOO zoo mab) is a monoclonal antibody. It is used to treat breast cancer and stomach cancer. This medicine may be used for other purposes; ask your health care provider or pharmacist if you have questions. COMMON BRAND NAME(S): Herceptin, Galvin Proffer, Trazimera What should I tell my health care provider before I take this medicine? They need to know if you have any of these conditions:  heart disease  heart failure  lung or breathing disease, like asthma  an unusual or allergic reaction to trastuzumab, benzyl alcohol, or other medications, foods, dyes, or preservatives  pregnant or trying to get pregnant  breast-feeding How should I use this medicine? This drug is given as an infusion into a vein. It is  administered in a hospital or clinic by a specially trained health care professional. Talk to your pediatrician regarding the use of this medicine in children. This medicine is not approved for use in children. Overdosage: If you think you have taken too much of this medicine contact a poison control center or emergency room at once. NOTE: This medicine is only for you. Do not share this medicine with others. What if I miss a dose? It is important not to miss a dose. Call your doctor or health care professional if you are unable to keep an appointment. What may interact with this medicine? This medicine may interact with the following medications:  certain types of chemotherapy, such as daunorubicin, doxorubicin, epirubicin, and idarubicin This list may not describe all possible interactions. Give your health care provider a list of all the medicines, herbs, non-prescription drugs, or dietary supplements you use. Also tell them if you smoke, drink alcohol, or use illegal drugs. Some items may interact with your medicine. What should I watch for while using this medicine? Visit your doctor for checks on your progress. Report any side effects. Continue your course of treatment even though you feel ill unless your doctor tells you to stop. Call your doctor or health care professional for advice if you get a fever, chills or sore throat, or other symptoms of a cold or flu. Do not treat yourself. Try to avoid being around people who are sick. You may experience fever, chills and shaking during your first infusion. These effects are usually mild and can be treated with other medicines. Report any side effects during the infusion to your  health care professional. Fever and chills usually do not happen with later infusions. Do not become pregnant while taking this medicine or for 7 months after stopping it. Women should inform their doctor if they wish to become pregnant or think they might be pregnant. Women  of child-bearing potential will need to have a negative pregnancy test before starting this medicine. There is a potential for serious side effects to an unborn child. Talk to your health care professional or pharmacist for more information. Do not breast-feed an infant while taking this medicine or for 7 months after stopping it. Women must use effective birth control with this medicine. What side effects may I notice from receiving this medicine? Side effects that you should report to your doctor or health care professional as soon as possible:  allergic reactions like skin rash, itching or hives, swelling of the face, lips, or tongue  chest pain or palpitations  cough  dizziness  feeling faint or lightheaded, falls  fever  general ill feeling or flu-like symptoms  signs of worsening heart failure like breathing problems; swelling in your legs and feet  unusually weak or tired Side effects that usually do not require medical attention (report to your doctor or health care professional if they continue or are bothersome):  bone pain  changes in taste  diarrhea  joint pain  nausea/vomiting  weight loss This list may not describe all possible side effects. Call your doctor for medical advice about side effects. You may report side effects to FDA at 1-800-FDA-1088. Where should I keep my medicine? This drug is given in a hospital or clinic and will not be stored at home. NOTE: This sheet is a summary. It may not cover all possible information. If you have questions about this medicine, talk to your doctor, pharmacist, or health care provider.  2020 Elsevier/Gold Standard (2016-04-02 14:37:52)  Paclitaxel injection What is this medicine? PACLITAXEL (PAK li TAX el) is a chemotherapy drug. It targets fast dividing cells, like cancer cells, and causes these cells to die. This medicine is used to treat ovarian cancer, breast cancer, lung cancer, Kaposi's sarcoma, and other  cancers. This medicine may be used for other purposes; ask your health care provider or pharmacist if you have questions. COMMON BRAND NAME(S): Onxol, Taxol What should I tell my health care provider before I take this medicine? They need to know if you have any of these conditions:  history of irregular heartbeat  liver disease  low blood counts, like low white cell, platelet, or red cell counts  lung or breathing disease, like asthma  tingling of the fingers or toes, or other nerve disorder  an unusual or allergic reaction to paclitaxel, alcohol, polyoxyethylated castor oil, other chemotherapy, other medicines, foods, dyes, or preservatives  pregnant or trying to get pregnant  breast-feeding How should I use this medicine? This drug is given as an infusion into a vein. It is administered in a hospital or clinic by a specially trained health care professional. Talk to your pediatrician regarding the use of this medicine in children. Special care may be needed. Overdosage: If you think you have taken too much of this medicine contact a poison control center or emergency room at once. NOTE: This medicine is only for you. Do not share this medicine with others. What if I miss a dose? It is important not to miss your dose. Call your doctor or health care professional if you are unable to keep an appointment. What may  interact with this medicine? Do not take this medicine with any of the following medications:  disulfiram  metronidazole This medicine may also interact with the following medications:  antiviral medicines for hepatitis, HIV or AIDS  certain antibiotics like erythromycin and clarithromycin  certain medicines for fungal infections like ketoconazole and itraconazole  certain medicines for seizures like carbamazepine, phenobarbital, phenytoin  gemfibrozil  nefazodone  rifampin  St. John's wort This list may not describe all possible interactions. Give your  health care provider a list of all the medicines, herbs, non-prescription drugs, or dietary supplements you use. Also tell them if you smoke, drink alcohol, or use illegal drugs. Some items may interact with your medicine. What should I watch for while using this medicine? Your condition will be monitored carefully while you are receiving this medicine. You will need important blood work done while you are taking this medicine. This medicine can cause serious allergic reactions. To reduce your risk you will need to take other medicine(s) before treatment with this medicine. If you experience allergic reactions like skin rash, itching or hives, swelling of the face, lips, or tongue, tell your doctor or health care professional right away. In some cases, you may be given additional medicines to help with side effects. Follow all directions for their use. This drug may make you feel generally unwell. This is not uncommon, as chemotherapy can affect healthy cells as well as cancer cells. Report any side effects. Continue your course of treatment even though you feel ill unless your doctor tells you to stop. Call your doctor or health care professional for advice if you get a fever, chills or sore throat, or other symptoms of a cold or flu. Do not treat yourself. This drug decreases your body's ability to fight infections. Try to avoid being around people who are sick. This medicine may increase your risk to bruise or bleed. Call your doctor or health care professional if you notice any unusual bleeding. Be careful brushing and flossing your teeth or using a toothpick because you may get an infection or bleed more easily. If you have any dental work done, tell your dentist you are receiving this medicine. Avoid taking products that contain aspirin, acetaminophen, ibuprofen, naproxen, or ketoprofen unless instructed by your doctor. These medicines may hide a fever. Do not become pregnant while taking this  medicine. Women should inform their doctor if they wish to become pregnant or think they might be pregnant. There is a potential for serious side effects to an unborn child. Talk to your health care professional or pharmacist for more information. Do not breast-feed an infant while taking this medicine. Men are advised not to father a child while receiving this medicine. This product may contain alcohol. Ask your pharmacist or healthcare provider if this medicine contains alcohol. Be sure to tell all healthcare providers you are taking this medicine. Certain medicines, like metronidazole and disulfiram, can cause an unpleasant reaction when taken with alcohol. The reaction includes flushing, headache, nausea, vomiting, sweating, and increased thirst. The reaction can last from 30 minutes to several hours. What side effects may I notice from receiving this medicine? Side effects that you should report to your doctor or health care professional as soon as possible:  allergic reactions like skin rash, itching or hives, swelling of the face, lips, or tongue  breathing problems  changes in vision  fast, irregular heartbeat  high or low blood pressure  mouth sores  pain, tingling, numbness in the hands  or feet  signs of decreased platelets or bleeding - bruising, pinpoint red spots on the skin, black, tarry stools, blood in the urine  signs of decreased red blood cells - unusually weak or tired, feeling faint or lightheaded, falls  signs of infection - fever or chills, cough, sore throat, pain or difficulty passing urine  signs and symptoms of liver injury like dark yellow or brown urine; general ill feeling or flu-like symptoms; light-colored stools; loss of appetite; nausea; right upper belly pain; unusually weak or tired; yellowing of the eyes or skin  swelling of the ankles, feet, hands  unusually slow heartbeat Side effects that usually do not require medical attention (report to your  doctor or health care professional if they continue or are bothersome):  diarrhea  hair loss  loss of appetite  muscle or joint pain  nausea, vomiting  pain, redness, or irritation at site where injected  tiredness This list may not describe all possible side effects. Call your doctor for medical advice about side effects. You may report side effects to FDA at 1-800-FDA-1088. Where should I keep my medicine? This drug is given in a hospital or clinic and will not be stored at home. NOTE: This sheet is a summary. It may not cover all possible information. If you have questions about this medicine, talk to your doctor, pharmacist, or health care provider.  2020 Elsevier/Gold Standard (2016-12-10 13:14:55)

## 2019-08-30 NOTE — Progress Notes (Signed)
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 Baseline and 1st Taxane visit:  Patient presented to the clinic, alone, for her first treatment. I met with patient after her lab and MD appointment.  Patient and I reviewed her medical history as well as her concomitant medications this morning. 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 Patient states numbness and tingling at night, to fingertips, she confirms wearing wrist braces when sleeping. Patient confirms no history of smoking.    PROs: Questionnaires were given to patient to complete in clinic prior to registration. Collected questionnaires and checked for completeness and accuracy.  Registration: Patient meets all eligibility requirements to be enrolled on the S1714 study.  Eligibility also confirmed by research RN, Foye Spurling and RN Wilber Bihari. Dr. Lindi Adie agrees patient meets eligiblity and patient was registered to the S1714 study via Open and assigned patient ID [id number] Labs: Baseline mandatory research labs were collected, per protocol, along with other standard of care labs ordered by provider, including serum creatinine level. Physician Assessments: CTCAE and Treatment Burden forms completed and signed by Dr. Lindi Adie.  History of Falls: Patient denied any falls in the past 6 months.  Medical and Smoking History: Reviewed with patient and CRFs completed.  Assessment for Interventions for CIPN: Reviewed with patient and CRFs completed. Neuropen Assessment: Completed per protocol by certified research RN, Wilber Bihari. [place] Assessment: Completed per protocol by certified research RN, Wilber Bihari.  Timed Get Up and Go Test: Completed per protocol by this trained research RN.  1st Taxane:  Mandatory research labs drawn during last 10 minutes of paclitaxel  infusion. Research nurse coordinated with RN Beverly Milch,  and Lenox Ponds LPN, for this blood draw. Docetaxel infusion start time: 12:49 PM Stop time: 9728 PM Time of Blood Draw: [place] PM by Lenox Ponds, LPN Plan: Informed patient of next study assessments in approximately 4 weeks and will be done on same day as treatment.  Plan for this visit to be approximately June 7th. Patient denied having any questions at this time. Patient thanked for her time and contribution to study and was encouraged to call clinic for any questions or concerns she may have prior to her next appointment.  Maxwell Marion, RN, BSN, [facility] Clinical Research 08/30/2019 2:41 PM

## 2019-08-30 NOTE — Patient Instructions (Signed)

## 2019-08-31 ENCOUNTER — Telehealth: Payer: Self-pay | Admitting: *Deleted

## 2019-08-31 NOTE — Progress Notes (Signed)
Pharmacist Chemotherapy Monitoring - Follow Up Assessment    I verify that I have reviewed each item in the below checklist:  . Regimen for the patient is scheduled for the appropriate day and plan matches scheduled date. Marland Kitchen Appropriate non-routine labs are ordered dependent on drug ordered. . If applicable, additional medications reviewed and ordered per protocol based on lifetime cumulative doses and/or treatment regimen.   Plan for follow-up and/or issues identified: No . I-vent associated with next due treatment: No . MD and/or nursing notified: No  [name] 08/31/2019 10:21 AM

## 2019-08-31 NOTE — Telephone Encounter (Signed)
Called pt to see how she did with her treatment yest.  She reports problem sleeping last night but no other problems.  Informed this may have been from the decadron & hopefully she will sleep better tonight since it should be out of her system fairly quickly.  Discussed benadryl or melatonin if she needs it. She will check with her pharmacist to make sure no interactions. Reminded to call with any concerns.

## 2019-08-31 NOTE — Telephone Encounter (Signed)
-----   Message from Teodoro Spray, RN sent at 08/30/2019  2:09 PM EDT ----- Regarding: Dr. Alvy Bimler first time chemo follow-up Dr. Alvy Bimler patient first time trastuzumab/taxol on 08/30/19. Patient tolerated treatment well. Thank you.

## 2019-09-05 NOTE — Progress Notes (Signed)
Patient Care Team: Antony Blackbird, MD as PCP - General (Family Medicine) Mauro Kaufmann, RN as Oncology Nurse Navigator Rockwell Germany, RN as Oncology Nurse Navigator Rolm Bookbinder, MD as Consulting Physician (General Surgery) Nicholas Lose, MD as Consulting Physician (Hematology and Oncology) Kyung Rudd, MD as Consulting Physician (Radiation Oncology)  DIAGNOSIS:    ICD-10-CM   1. Malignant neoplasm of upper-outer quadrant of left breast in female, estrogen receptor negative ([place])  C50.412    Z17.1     SUMMARY OF ONCOLOGIC HISTORY: Oncology History  Malignant neoplasm of upper-outer quadrant of left breast in female, estrogen receptor negative ([place])  07/27/2019 Initial Diagnosis   Screening mammogram showed a left breast asymmetry. Diagnostic mammogram and US showed a 1.2cm left breast mass, 12:30 position, no left axillary adenopathy. 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   08/05/2019 Surgery   Left lumpectomy: Grade 3 IDC, 2 cm with high-grade DCIS, lymphovascular invasion was identified, 1 lymph node negative, margins negative, ER 0%, PR 0%, HER-2 positive, Ki-67 90%   08/05/2019 Cancer Staging   Staging form: Breast, AJCC 8th Edition - Pathologic stage from 08/05/2019: Stage IIA (pT2, pN0, cM0, G3, ER-, PR-, HER2+) - Signed by Gardenia Phlegm, NP on 08/18/2019   08/30/2019 -  Chemotherapy   The patient had PACLitaxel (TAXOL) 138 mg in sodium chloride 0.9 % 250 mL chemo infusion (</= 85m/m2), 80 mg/m2 = 138 mg, Intravenous,  Once, 1 of 3 cycles Administration: 138 mg (08/30/2019) trastuzumab-dkst (OGIVRI) 273 mg in sodium chloride 0.9 % 250 mL chemo infusion, 4 mg/kg = 273 mg, Intravenous,  Once, 1 of 16 cycles Administration: 273 mg (08/30/2019)  for chemotherapy treatment.      CHIEF COMPLIANT: Cycle 2 Taxol Herceptin  INTERVAL HISTORY: Sherri OHLINGERis a 54y.o. with above-mentioned history of  left breast cancer who underwent  a lumpectomy and is currently on adjuvant chemotherapy with Taxol Herceptin.She is a participant in the neuropathy research study. She presents to the clinic today for a toxicity check and cycle 2.   She tolerated chemotherapy extremely well without any side effects.  Did not experience any nausea or vomiting.  Did not experience any neuropathy symptoms.  Her blood counts are excellent today.  ALLERGIES:  has No Known Allergies.  MEDICATIONS:  Current Outpatient Medications  Medication Sig Dispense Refill  . ibuprofen (ADVIL) 200 MG tablet Take 400-600 mg by mouth every 8 (eight) hours as needed for moderate pain.     .Marland Kitchenlidocaine-prilocaine (EMLA) cream Apply to affected area once 30 g 3  . LORazepam (ATIVAN) 0.5 MG tablet Take 1 tablet (0.5 mg total) by mouth at bedtime as needed (Nausea or vomiting). 30 tablet 1  . Multiple Vitamin (MULTIVITAMIN WITH MINERALS) TABS tablet Take 1 tablet by mouth daily. One-A-Day for Women 50+    . ondansetron (ZOFRAN) 8 MG tablet Take 1 tablet (8 mg total) by mouth 2 (two) times daily as needed (Nausea or vomiting). 30 tablet 1  . prochlorperazine (COMPAZINE) 10 MG tablet TAKE 1 TABLET(10 MG) BY MOUTH EVERY 6 HOURS AS NEEDED FOR NAUSEA OR VOMITING 30 tablet 1   No current facility-administered medications for this visit.    PHYSICAL EXAMINATION: ECOG PERFORMANCE STATUS: 0 - Asymptomatic  Vitals:   09/06/19 1059  BP: 126/84  Pulse: 100  Resp: 17  Temp: 97.8 F (36.6 C)  SpO2: 100%   Filed Weights   09/06/19 1059  Weight:  155 lb 6.4 oz (70.5 kg)    LABORATORY DATA:  I have reviewed the data as listed CMP Latest Ref Rng & Units 08/30/2019 07/28/2019 11/11/2018  Glucose 70 - 99 mg/dL 90 97 129(H)  BUN 6 - 20 mg/dL _0 Creatinine 0.44 - 1.00 mg/dL 0.71 0.76 0.81  Sodium 135 - 145 mmol/L 143 144 138  Potassium 3.5 - 5.1 mmol/L 4.2 4.6 4.2  Chloride 98 - 111 mmol/L 109 109 104  CO2 22 - 32 mmol/L _1 Calcium 8.9 - 10.3 mg/dL 9.0 9.3  8.6(L)  Total Protein 6.5 - 8.1 g/dL 6.9 7.1 -  Total Bilirubin 0.3 - 1.2 mg/dL 0.4 0.5 -  Alkaline Phos 38 - 126 U/L 92 87 -  AST 15 - 41 U/L 17 15 -  ALT 0 - 44 U/L 17 17 -    Lab Results  Component Value Date   WBC 4.8 09/06/2019   HGB 13.4 09/06/2019   HCT 40.6 09/06/2019   MCV 92.9 09/06/2019   PLT 274 09/06/2019   NEUTROABS 2.8 09/06/2019    ASSESSMENT & PLAN:  Malignant neoplasm of upper-outer quadrant of left breast in female, estrogen receptor negative ([place]) 07/27/2019:Screening mammogram showed a left breast asymmetry. Diagnostic mammogram and US showed a 1.2cm left breast mass, 12:30 position, no left axillary adenopathy. 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 T1c N0 stage Ia clinical stage  Recommendation: 1.08/05/2019:Left lumpectomy: Grade 3 IDC, 2 cm with high-grade DCIS, lymphovascular invasion was identified, 1 lymph node negative, margins negative, ER 0%, PR 0%, HER-2 positive, Ki-67 90% 2.Adjuvant chemotherapy with Taxol Herceptin followed by Herceptin maintenance for 1 year started 08/30/2019 3.Adjuvant radiation therapy Patient is participating in [place] neuropathy clinical trial --------------------------------------------------------------------------------------------------------------------------------------------------------------- Current treatment: Cycle 2 Taxol Herceptin Echocardiogram: 08/02/2019: EF 60 to 65%  Chemo toxicities: Denies any side effects to chemo so far. Mild fatigue for a day or 2.  She had difficulty sleeping on the day of chemo because of steroids.   Return to clinic weekly for Taxol every other week for follow-up with me.  No orders of the defined types were placed in this encounter.  The patient has a good understanding of the overall plan. she agrees with it. she will call with any problems that may develop before the next visit here.  Total time spent: 30 mins including face to face  time and time spent for planning, charting and coordination of care  Nicholas Lose, MD 09/06/2019  I, Cloyde Reams Dorshimer, am acting as scribe for Dr. Nicholas Lose.  I have reviewed the above documentation for accuracy and completeness, and I agree with the above.

## 2019-09-06 ENCOUNTER — Other Ambulatory Visit: Payer: BC Managed Care – PPO

## 2019-09-06 ENCOUNTER — Inpatient Hospital Stay: Payer: BC Managed Care – PPO

## 2019-09-06 ENCOUNTER — Encounter: Payer: Self-pay | Admitting: Hematology and Oncology

## 2019-09-06 ENCOUNTER — Encounter: Payer: Self-pay | Admitting: *Deleted

## 2019-09-06 ENCOUNTER — Other Ambulatory Visit: Payer: Self-pay

## 2019-09-06 ENCOUNTER — Inpatient Hospital Stay: Payer: BC Managed Care – PPO | Admitting: Hematology and Oncology

## 2019-09-06 DIAGNOSIS — C50412 Malignant neoplasm of upper-outer quadrant of left female breast: Secondary | ICD-10-CM | POA: Diagnosis not present

## 2019-09-06 DIAGNOSIS — Z171 Estrogen receptor negative status [ER-]: Secondary | ICD-10-CM

## 2019-09-06 LAB — CBC WITH DIFFERENTIAL (CANCER CENTER ONLY)
Abs Immature Granulocytes: 0.04 10*3/uL (ref 0.00–0.07)
Basophils Absolute: 0 10*3/uL (ref 0.0–0.1)
Basophils Relative: 0 %
Eosinophils Absolute: 0.1 10*3/uL (ref 0.0–0.5)
Eosinophils Relative: 2 %
HCT: 40.6 % (ref 36.0–46.0)
Hemoglobin: 13.4 g/dL (ref 12.0–15.0)
Immature Granulocytes: 1 %
Lymphocytes Relative: 33 %
Lymphs Abs: 1.6 10*3/uL (ref 0.7–4.0)
MCH: 30.7 pg (ref 26.0–34.0)
MCHC: 33 g/dL (ref 30.0–36.0)
MCV: 92.9 fL (ref 80.0–100.0)
Monocytes Absolute: 0.2 10*3/uL (ref 0.1–1.0)
Monocytes Relative: 5 %
Neutro Abs: 2.8 10*3/uL (ref 1.7–7.7)
Neutrophils Relative %: 59 %
Platelet Count: 274 10*3/uL (ref 150–400)
RBC: 4.37 MIL/uL (ref 3.87–5.11)
RDW: 11.8 % (ref 11.5–15.5)
WBC Count: 4.8 10*3/uL (ref 4.0–10.5)
nRBC: 0 % (ref 0.0–0.2)

## 2019-09-06 LAB — CMP (CANCER CENTER ONLY)
ALT: 36 U/L (ref 0–44)
AST: 24 U/L (ref 15–41)
Albumin: 3.6 g/dL (ref 3.5–5.0)
Alkaline Phosphatase: 86 U/L (ref 38–126)
Anion gap: 10 (ref 5–15)
BUN: 18 mg/dL (ref 6–20)
CO2: 22 mmol/L (ref 22–32)
Calcium: 8.7 mg/dL — ABNORMAL LOW (ref 8.9–10.3)
Chloride: 109 mmol/L (ref 98–111)
Creatinine: 0.72 mg/dL (ref 0.44–1.00)
GFR, Est AFR Am: >60 mL/min (ref >60)
GFR, Estimated: >60 mL/min (ref >60)
Glucose, Bld: 100 mg/dL — ABNORMAL HIGH (ref 70–99)
Potassium: 4.2 mmol/L (ref 3.5–5.1)
Sodium: 141 mmol/L (ref 135–145)
Total Bilirubin: <0.2 mg/dL — ABNORMAL LOW (ref 0.3–1.2)
Total Protein: 6.6 g/dL (ref 6.5–8.1)

## 2019-09-06 MED ORDER — DIPHENHYDRAMINE HCL 50 MG/ML IJ SOLN
INTRAMUSCULAR | Status: AC
  Filled 2019-09-06: qty 1

## 2019-09-06 MED ORDER — HEPARIN SOD (PORK) LOCK FLUSH 100 UNIT/ML IV SOLN
500.0000 [IU] | Freq: Once | INTRAVENOUS | Status: AC | PRN
  Administered 2019-09-06: 500 [IU]
  Filled 2019-09-06: qty 5

## 2019-09-06 MED ORDER — SODIUM CHLORIDE 0.9 % IV SOLN
80.0000 mg/m2 | Freq: Once | INTRAVENOUS | Status: AC
  Administered 2019-09-06: 138 mg via INTRAVENOUS
  Filled 2019-09-06: qty 23

## 2019-09-06 MED ORDER — ACETAMINOPHEN 325 MG PO TABS
ORAL_TABLET | ORAL | Status: AC
  Filled 2019-09-06: qty 2

## 2019-09-06 MED ORDER — FAMOTIDINE IN NACL 20-0.9 MG/50ML-% IV SOLN
20.0000 mg | Freq: Once | INTRAVENOUS | Status: AC
  Administered 2019-09-06: 20 mg via INTRAVENOUS

## 2019-09-06 MED ORDER — SODIUM CHLORIDE 0.9 % IV SOLN
10.0000 mg | Freq: Once | INTRAVENOUS | Status: AC
  Administered 2019-09-06: 10 mg via INTRAVENOUS
  Filled 2019-09-06: qty 10

## 2019-09-06 MED ORDER — TRASTUZUMAB-DKST CHEMO 150 MG IV SOLR
150.0000 mg | Freq: Once | INTRAVENOUS | Status: AC
  Administered 2019-09-06: 150 mg via INTRAVENOUS
  Filled 2019-09-06: qty 7.14

## 2019-09-06 MED ORDER — SODIUM CHLORIDE 0.9 % IV SOLN
Freq: Once | INTRAVENOUS | Status: AC
  Filled 2019-09-06: qty 250

## 2019-09-06 MED ORDER — SODIUM CHLORIDE 0.9% FLUSH
10.0000 mL | INTRAVENOUS | Status: DC | PRN
  Administered 2019-09-06: 10 mL
  Filled 2019-09-06: qty 10

## 2019-09-06 MED ORDER — ACETAMINOPHEN 325 MG PO TABS
650.0000 mg | ORAL_TABLET | Freq: Once | ORAL | Status: AC
  Administered 2019-09-06: 650 mg via ORAL

## 2019-09-06 MED ORDER — FAMOTIDINE IN NACL 20-0.9 MG/50ML-% IV SOLN
INTRAVENOUS | Status: AC
  Filled 2019-09-06: qty 50

## 2019-09-06 MED ORDER — DIPHENHYDRAMINE HCL 50 MG/ML IJ SOLN
25.0000 mg | Freq: Once | INTRAMUSCULAR | Status: AC
  Administered 2019-09-06: 25 mg via INTRAVENOUS

## 2019-09-06 NOTE — Progress Notes (Signed)
Met with patient in infusion to introduce myself as Arboriculturist and to offer available resources.  Discussed one-time $1000 Radio broadcast assistant to assist with personal expenses while going through treatment.  Gave her my card if interested in applying and for additional financial questions or concerns.

## 2019-09-06 NOTE — Patient Instructions (Signed)
[facility] Discharge Instructions for Patients Receiving Chemotherapy  Today you received the following Immunotherapy agent: Trastuzumab and chemotherapy agent: Paclitaxel  To help prevent nausea and vomiting after your treatment, we encourage you to take your nausea medication as directed by your MD.   If you develop nausea and vomiting that is not controlled by your nausea medication, call the clinic.   BELOW ARE SYMPTOMS THAT SHOULD BE REPORTED IMMEDIATELY:  *FEVER GREATER THAN 100.5 F  *CHILLS WITH OR WITHOUT FEVER  NAUSEA AND VOMITING THAT IS NOT CONTROLLED WITH YOUR NAUSEA MEDICATION  *UNUSUAL SHORTNESS OF BREATH  *UNUSUAL BRUISING OR BLEEDING  TENDERNESS IN MOUTH AND THROAT WITH OR WITHOUT PRESENCE OF ULCERS  *URINARY PROBLEMS  *BOWEL PROBLEMS  UNUSUAL RASH Items with * indicate a potential emergency and should be followed up as soon as possible.  Feel free to call the clinic should you have any questions or concerns. The clinic phone number is [phone].  Please show the [facility] at check-in to the Emergency Department and triage nurse.  Coronavirus (COVID-19) Are you at risk?  Are you at risk for the Coronavirus (COVID-19)?  To be considered HIGH RISK for Coronavirus (COVID-19), you have to meet the following criteria:  . Traveled to [place], [place], [place], [place] or [place]; or in the [place] to [place], [place], [place], or [place]; and have fever, cough, and shortness of breath within the last 2 weeks of travel OR . Been in close contact with a person diagnosed with COVID-19 within the last 2 weeks and have fever, cough, and shortness of breath . IF YOU DO NOT MEET THESE CRITERIA, YOU ARE CONSIDERED LOW RISK FOR COVID-19.  What to do if you are HIGH RISK for COVID-19?  Marland Kitchen If you are having a medical emergency, call 911. . Seek medical care right away. Before you go to a doctor's office, urgent care or emergency  department, call ahead and tell them about your recent travel, contact with someone diagnosed with COVID-19, and your symptoms. You should receive instructions from your physician's office regarding next steps of care.  . When you arrive at healthcare provider, tell the healthcare staff immediately you have returned from visiting [place], [place], [place], [place] or [place]; or traveled in the [place] to [place], [place], [place], or [place]; in the last two weeks or you have been in close contact with a person diagnosed with COVID-19 in the last 2 weeks.   . Tell the health care staff about your symptoms: fever, cough and shortness of breath. . After you have been seen by a medical provider, you will be either: o Tested for (COVID-19) and discharged home on quarantine except to seek medical care if symptoms worsen, and asked to  - Stay home and avoid contact with others until you get your results (4-5 days)  - Avoid travel on public transportation if possible (such as bus, train, or airplane) or o Sent to the Emergency Department by EMS for evaluation, COVID-19 testing, and possible admission depending on your condition and test results.  What to do if you are LOW RISK for COVID-19?  Reduce your risk of any infection by using the same precautions used for avoiding the common cold or flu:  Marland Kitchen Wash your hands often with soap and warm water for at least 20 seconds.  If soap and water are not readily available, use an alcohol-based hand sanitizer with at least 60%  alcohol.  . If coughing or sneezing, cover your mouth and nose by coughing or sneezing into the elbow areas of your shirt or coat, into a tissue or into your sleeve (not your hands). . Avoid shaking hands with others and consider head nods or verbal greetings only. . Avoid touching your eyes, nose, or mouth with unwashed hands.  . Avoid close contact with people who are sick. . Avoid places or events with large numbers of people  in one location, like concerts or sporting events. . Carefully consider travel plans you have or are making. . If you are planning any travel outside or inside the [place], visit the CDC's Travelers' Health webpage for the latest health notices. . If you have some symptoms but not all symptoms, continue to monitor at home and seek medical attention if your symptoms worsen. . If you are having a medical emergency, call 911.   [place] / e-Visit: [email or web address]         [facility]: [facility]: [phone]                   [facility]: [phone]

## 2019-09-06 NOTE — Assessment & Plan Note (Signed)
07/27/2019:Screening mammogram showed a left breast asymmetry. Diagnostic mammogram and US showed a 1.2cm left breast mass, 12:30 position, no left axillary adenopathy. 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 T1c N0 stage Ia clinical stage  Recommendation: 1.08/05/2019:Left lumpectomy: Grade 3 IDC, 2 cm with high-grade DCIS, lymphovascular invasion was identified, 1 lymph node negative, margins negative, ER 0%, PR 0%, HER-2 positive, Ki-67 90% 2.Adjuvant chemotherapy with Taxol Herceptin followed by Herceptin maintenance for 1 year started 08/30/2019 3.Adjuvant radiation therapy Patient is participating in [place] neuropathy clinical trial --------------------------------------------------------------------------------------------------------------------------------------------------------------- Current treatment: Cycle 2 Taxol Herceptin Echocardiogram: 08/02/2019: EF 60 to 65%  Chemo toxicities:   Return to clinic weekly for Taxol every other week for follow-up with me.

## 2019-09-07 ENCOUNTER — Other Ambulatory Visit: Payer: Self-pay | Admitting: Hematology and Oncology

## 2019-09-07 DIAGNOSIS — C50412 Malignant neoplasm of upper-outer quadrant of left female breast: Secondary | ICD-10-CM

## 2019-09-07 NOTE — Progress Notes (Signed)
Pharmacist Chemotherapy Monitoring - Follow Up Assessment    I verify that I have reviewed each item in the below checklist:  . Regimen for the patient is scheduled for the appropriate day and plan matches scheduled date. Marland Kitchen Appropriate non-routine labs are ordered dependent on drug ordered. . If applicable, additional medications reviewed and ordered per protocol based on lifetime cumulative doses and/or treatment regimen.   Plan for follow-up and/or issues identified: No . I-vent associated with next due treatment: No . MD and/or nursing notified: No   Kennith Center, Pharm.D., CPP 09/07/2019@3 :46 PM

## 2019-09-09 ENCOUNTER — Other Ambulatory Visit: Payer: Self-pay

## 2019-09-09 ENCOUNTER — Ambulatory Visit: Payer: BC Managed Care – PPO | Attending: General Surgery | Admitting: Physical Therapy

## 2019-09-09 ENCOUNTER — Encounter: Payer: Self-pay | Admitting: Physical Therapy

## 2019-09-09 DIAGNOSIS — Z171 Estrogen receptor negative status [ER-]: Secondary | ICD-10-CM | POA: Insufficient documentation

## 2019-09-09 DIAGNOSIS — M25612 Stiffness of left shoulder, not elsewhere classified: Secondary | ICD-10-CM | POA: Diagnosis present

## 2019-09-09 DIAGNOSIS — C50412 Malignant neoplasm of upper-outer quadrant of left female breast: Secondary | ICD-10-CM | POA: Diagnosis not present

## 2019-09-09 DIAGNOSIS — Z483 Aftercare following surgery for neoplasm: Secondary | ICD-10-CM | POA: Insufficient documentation

## 2019-09-09 DIAGNOSIS — R293 Abnormal posture: Secondary | ICD-10-CM | POA: Insufficient documentation

## 2019-09-09 NOTE — Therapy (Signed)
[place], [place], [address] Phone: [phone]   Fax:  [phone]  Physical Therapy Treatment  Patient Details  Name: [name] MRN: [id number] Date of Birth: 1965/09/30 Referring Provider (PT): Dr. Rolm Bookbinder   Encounter Date: 09/09/2019  PT End of Session - 09/09/19 1053    Visit Number  2    Number of Visits  6    Date for PT Re-Evaluation  10/07/19    PT Start Time  1018    PT Stop Time  1053    PT Time Calculation (min)  35 min    Activity Tolerance  Patient tolerated treatment well    Behavior During Therapy  [facility] for tasks assessed/performed       Past Medical History:  Diagnosis Date  . Arthritis   . Cancer [facility])    Left breast  . Headache    migraine     Past Surgical History:  Procedure Laterality Date  . ARTERY BIOPSY Right 11/11/2018   Procedure: BIOPSY TEMPORAL ARTERY;  Surgeon: Rosetta Posner, MD;  Location: [facility];  Service: Vascular;  Laterality: Right;  . BREAST LUMPECTOMY WITH RADIOACTIVE SEED AND SENTINEL LYMPH NODE BIOPSY Left 08/05/2019   Procedure: LEFT BREAST LUMPECTOMY WITH RADIOACTIVE SEED AND LEFT AXILLARY SENTINEL LYMPH NODE BIOPSY;  Surgeon: Rolm Bookbinder, MD;  Location: [place];  Service: General;  Laterality: Left;  PEC BLOCK  . CESAREAN SECTION     x2  . PORTACATH PLACEMENT Right 08/05/2019   Procedure: INSERTION PORT-A-CATH WITH ULTRASOUND GUIDANCE;  Surgeon: Rolm Bookbinder, MD;  Location: [facility];  Service: General;  Laterality: Right;    There were no vitals filed for this visit.  Subjective Assessment - 09/09/19 1020    Subjective  Patient underwent a left lumpectomy and sentinel node biopsy (1 negative node) on 08/05/2019. She began chemo 08/30/2019 as this is HER2+. She will then undergo radiation for 4-6 weeks.    Pertinent History  Patient was diagnosed on 07/12/2019 with left grade II invasive ductal carcinoma breast cancer.  It is ER/PR  negative and HER2 positive with a Ki67 of 90%. Patient underwent a left lumpectomy and sentinel node biopsy (1 negative node) on 08/05/2019.    Patient Stated Goals  Get my arm moving better    Currently in Pain?  Yes    Pain Score  4     Pain Location  Axilla    Pain Descriptors / Indicators  Tightness    Pain Type  Surgical pain    Pain Onset  More than a month ago    Pain Frequency  Intermittent    Aggravating Factors   Reaching out to the side    Pain Relieving Factors  Staying still    Multiple Pain Sites  No         [facility] PT Assessment - 09/09/19 0001      Assessment   Medical Diagnosis  s/p left lumpectomy and SLNB    Referring Provider (PT)  Dr. Rolm Bookbinder    Onset Date/Surgical Date  08/05/19    Hand Dominance  Right    Prior Therapy  Baselines      Precautions   Precautions  Other (comment)    Precaution Comments  recent surgery; left arm lymphedema risk      Restrictions   Weight Bearing Restrictions  No      Balance Screen   Has the patient fallen in the past 6  months  No    Has the patient had a decrease in activity level because of a fear of falling?   No    Is the patient reluctant to leave their home because of a fear of falling?   No      Home Environment   Living Environment  Private residence    Living Arrangements  Children;Spouse/significant other   Husband, 52 and 45 y.o. sons   Available Help at Discharge  Family      Prior Function   Level of Independence  Independent    Vocation  Full time employment    Vocation Requirements  Not currently working    Leisure  She is walking daily for 40 minutes      Cognition   Overall Cognitive Status  Within Functional Limits for tasks assessed      Observation/Other Assessments   Observations  Both incisions appear to be well healed and have good skin mobility. There is no visible or palpable edema or cording present.       Posture/Postural Control   Posture/Postural Control  Postural  limitations    Postural Limitations  Rounded Shoulders;Forward head      ROM / Strength   AROM / PROM / Strength  AROM      AROM   AROM Assessment Site  Shoulder    Right/Left Shoulder  Left    Left Shoulder Extension  60 Degrees    Left Shoulder Flexion  130 Degrees    Left Shoulder ABduction  125 Degrees    Left Shoulder Internal Rotation  53 Degrees    Left Shoulder External Rotation  67 Degrees      Strength   Overall Strength  Within functional limits for tasks performed        LYMPHEDEMA/ONCOLOGY QUESTIONNAIRE - 09/09/19 1025      Type   Cancer Type  Left breast cancer      Surgeries   Lumpectomy Date  08/05/19    Sentinel Lymph Node Biopsy Date  08/05/19    Number Lymph Nodes Removed  1      Treatment   Active Chemotherapy Treatment  Yes    Date  08/30/19    Past Chemotherapy Treatment  No    Active Radiation Treatment  No    Past Radiation Treatment  No    Current Hormone Treatment  No    Past Hormone Therapy  No      What other symptoms do you have   Are you Having Heaviness or Tightness  Yes    Are you having Pain  Yes    Are you having pitting edema  No    Is it Hard or Difficult finding clothes that fit  No    Do you have infections  No    Is there Decreased scar mobility  No    Stemmer Sign  No      Lymphedema Assessments   Lymphedema Assessments  Upper extremities      Right Upper Extremity Lymphedema   10 cm Proximal to Olecranon Process  27.4 cm    Olecranon Process  24.9 cm    10 cm Proximal to Ulnar Styloid Process  23.8 cm    Just Proximal to Ulnar Styloid Process  15.4 cm    Across Hand at [employer]  17.8 cm    At [employer] of 2nd Digit  5.9 cm      Left Upper Extremity Lymphedema   10  cm Proximal to Olecranon Process  26.9 cm    Olecranon Process  23.6 cm    10 cm Proximal to Ulnar Styloid Process  22.6 cm    Just Proximal to Ulnar Styloid Process  15.2 cm    Across Hand at [employer]  17.2 cm    At [place] of 2nd Digit  5.8 cm          Quick Dash - 09/09/19 0001    Open a tight or new jar  Mild difficulty    Do heavy household chores (wash walls, wash floors)  Mild difficulty    Carry a shopping bag or briefcase  Mild difficulty    Wash your back  Mild difficulty    Use a knife to cut food  No difficulty    Recreational activities in which you take some force or impact through your arm, shoulder, or hand (golf, hammering, tennis)  Moderate difficulty    During the past week, to what extent has your arm, shoulder or hand problem interfered with your normal social activities with family, friends, neighbors, or groups?  Slightly    During the past week, to what extent has your arm, shoulder or hand problem limited your work or other regular daily activities  Slightly    Arm, shoulder, or hand pain.  Mild    Tingling (pins and needles) in your arm, shoulder, or hand  None    Difficulty Sleeping  No difficulty    DASH Score  20.45 %                     PT Education - 09/09/19 1052    Education Details  Added closed chain flexion and abduction and reviewed previous HEP - encouarged pt to do ER and AAROM flexion exercises in supine    Person(s) Educated  Patient    Methods  Explanation;Demonstration;Handout    Comprehension  Returned demonstration;Verbalized understanding          PT Long Term Goals - 09/09/19 1056      PT LONG TERM GOAL #1   Title  Patient will demonstrate she has regained full shoulder ROM and function post operatively compared to baselines.    Time  4    Period  Weeks    Status  On-going    Target Date  10/07/19      PT LONG TERM GOAL #2   Title  Patient will increase left shoulder active flexion to >/= 140 degrees to increased ease reaching.    Baseline  130 post op; 140 baseline    Time  4    Period  Weeks    Target Date  10/07/19      PT LONG TERM GOAL #3   Title  Patient will increase left shoulder active abduction to >/= 150 degrees to increased ease  reaching.    Baseline  125 post op; 157 baseline    Time  4    Period  Weeks    Status  New    Target Date  10/07/19      PT LONG TERM GOAL #4   Title  Patient will decrease DASH score to </= 8 for improved overall upper extremity function.    Baseline  4.55 baseline; 20.45 post op    Time  4    Period  Weeks    Status  New    Target Date  10/07/19      PT LONG TERM  GOAL #5   Title  Patient will verbalize good understanding of risk reduction practices for lymphedema.    Time  4    Period  Weeks    Status  New    Target Date  10/07/19            Plan - 09/09/19 1053    Clinical Impression Statement  Patient is doing well s/p left lumpectomy and SLNB on 08/05/2019. She has some limited ROM mostly with left shoulder abduction but no signs of axillary cording or lymphedema. Her incisions are well healed but she was encouraged to use Vitamin E cream or coconut oil to reduce the scars. She will benefit from education on lymphedema risk reduction and some PT to regain full shoulder ROM.    PT Frequency  1x / week    PT Duration  4 weeks    PT Treatment/Interventions  ADLs/Self Care Home Management;Therapeutic exercise;Patient/family education    PT Next Visit Plan  PROM left shoulder; myofascial techniques to left medial upper arm and axilla; educate on lymphedema risk reduction as she can not attend an ABC class as chemo is on mondays. Schedule for L-Dex screen around 11/04/2019 (forgot today)    PT Home Exercise Plan  Closed chain shoulder flexion and abduction    Consulted and Agree with Plan of Care  Patient       Patient will benefit from skilled therapeutic intervention in order to improve the following deficits and impairments:  Postural dysfunction, Decreased range of motion, Pain, Impaired UE functional use, Decreased knowledge of precautions  Visit Diagnosis: Malignant neoplasm of upper-outer quadrant of left breast in female, estrogen receptor negative ([facility]) - Plan: PT  plan of care cert/re-cert  Abnormal posture - Plan: PT plan of care cert/re-cert  Aftercare following surgery for neoplasm - Plan: PT plan of care cert/re-cert  Stiffness of left shoulder, not elsewhere classified - Plan: PT plan of care cert/re-cert     Problem List Patient Active Problem List   Diagnosis Date Noted  . Malignant neoplasm of upper-outer quadrant of left breast in female, estrogen receptor negative ([facility]) 07/27/2019  . Neuroretinitis of both eyes 11/14/2018  . Retro-orbital pain   . Papilledema    [name], [place] 09/09/19 11:01 AM  [facility] [employer] [place], [place], [address] Phone: [phone]   Fax:  [phone]  Name: [name] MRN: [id number] Date of Birth: 1966/04/20

## 2019-09-09 NOTE — Patient Instructions (Signed)
Closed Chain: Shoulder Flexion / Extension - on Wall    Hands on wall, step backward WITH BOTH FEET - LIKE DOING DOWNWARD DOG ON THE WALL.  Do __5_ times, HOLDING FOR 5 SECONDS, _2__ times per day.  http://ss.exer.us/265   Copyright  VHI. All rights reserved.  Closed Chain: Shoulder Abduction / Adduction - on Wall    One hand on wall, step to side and return. Stepping causes shoulder to abduct and adduct. Step _5__ times, HOLDING FOR 5 SECONDS, _2__ times per day.  http://ss.exer.us/267   Copyright  VHI. All rights reserved.

## 2019-09-13 ENCOUNTER — Inpatient Hospital Stay: Payer: BC Managed Care – PPO

## 2019-09-13 ENCOUNTER — Other Ambulatory Visit: Payer: Self-pay

## 2019-09-13 VITALS — BP 134/73 | HR 88 | Temp 98.8°F | Resp 17 | Ht 60.0 in | Wt 155.8 lb

## 2019-09-13 DIAGNOSIS — C50412 Malignant neoplasm of upper-outer quadrant of left female breast: Secondary | ICD-10-CM

## 2019-09-13 DIAGNOSIS — Z171 Estrogen receptor negative status [ER-]: Secondary | ICD-10-CM

## 2019-09-13 LAB — CMP (CANCER CENTER ONLY)
ALT: 22 U/L (ref 0–44)
AST: 15 U/L (ref 15–41)
Albumin: 3.5 g/dL (ref 3.5–5.0)
Alkaline Phosphatase: 77 U/L (ref 38–126)
Anion gap: 8 (ref 5–15)
BUN: 16 mg/dL (ref 6–20)
CO2: 24 mmol/L (ref 22–32)
Calcium: 8.6 mg/dL — ABNORMAL LOW (ref 8.9–10.3)
Chloride: 110 mmol/L (ref 98–111)
Creatinine: 0.73 mg/dL (ref 0.44–1.00)
GFR, Est AFR Am: >60 mL/min (ref >60)
GFR, Estimated: >60 mL/min (ref >60)
Glucose, Bld: 92 mg/dL (ref 70–99)
Potassium: 4 mmol/L (ref 3.5–5.1)
Sodium: 142 mmol/L (ref 135–145)
Total Bilirubin: <0.2 mg/dL — ABNORMAL LOW (ref 0.3–1.2)
Total Protein: 6.4 g/dL — ABNORMAL LOW (ref 6.5–8.1)

## 2019-09-13 LAB — CBC WITH DIFFERENTIAL (CANCER CENTER ONLY)
Abs Immature Granulocytes: 0.03 10*3/uL (ref 0.00–0.07)
Basophils Absolute: 0 10*3/uL (ref 0.0–0.1)
Basophils Relative: 0 %
Eosinophils Absolute: 0.1 10*3/uL (ref 0.0–0.5)
Eosinophils Relative: 2 %
HCT: 38.8 % (ref 36.0–46.0)
Hemoglobin: 12.8 g/dL (ref 12.0–15.0)
Immature Granulocytes: 1 %
Lymphocytes Relative: 43 %
Lymphs Abs: 1.5 10*3/uL (ref 0.7–4.0)
MCH: 31 pg (ref 26.0–34.0)
MCHC: 33 g/dL (ref 30.0–36.0)
MCV: 93.9 fL (ref 80.0–100.0)
Monocytes Absolute: 0.3 10*3/uL (ref 0.1–1.0)
Monocytes Relative: 7 %
Neutro Abs: 1.7 10*3/uL (ref 1.7–7.7)
Neutrophils Relative %: 47 %
Platelet Count: 299 10*3/uL (ref 150–400)
RBC: 4.13 MIL/uL (ref 3.87–5.11)
RDW: 12.2 % (ref 11.5–15.5)
WBC Count: 3.6 10*3/uL — ABNORMAL LOW (ref 4.0–10.5)
nRBC: 0 % (ref 0.0–0.2)

## 2019-09-13 MED ORDER — ACETAMINOPHEN 325 MG PO TABS
650.0000 mg | ORAL_TABLET | Freq: Once | ORAL | Status: AC
  Administered 2019-09-13: 650 mg via ORAL

## 2019-09-13 MED ORDER — FAMOTIDINE IN NACL 20-0.9 MG/50ML-% IV SOLN
20.0000 mg | Freq: Once | INTRAVENOUS | Status: AC
  Administered 2019-09-13: 20 mg via INTRAVENOUS

## 2019-09-13 MED ORDER — HEPARIN SOD (PORK) LOCK FLUSH 100 UNIT/ML IV SOLN
500.0000 [IU] | Freq: Once | INTRAVENOUS | Status: AC | PRN
  Administered 2019-09-13: 500 [IU]
  Filled 2019-09-13: qty 5

## 2019-09-13 MED ORDER — SODIUM CHLORIDE 0.9 % IV SOLN
Freq: Once | INTRAVENOUS | Status: AC
  Filled 2019-09-13: qty 250

## 2019-09-13 MED ORDER — TRASTUZUMAB-DKST CHEMO 150 MG IV SOLR
150.0000 mg | Freq: Once | INTRAVENOUS | Status: AC
  Administered 2019-09-13: 150 mg via INTRAVENOUS
  Filled 2019-09-13: qty 7.14

## 2019-09-13 MED ORDER — SODIUM CHLORIDE 0.9% FLUSH
10.0000 mL | INTRAVENOUS | Status: DC | PRN
  Administered 2019-09-13: 10 mL
  Filled 2019-09-13: qty 10

## 2019-09-13 MED ORDER — SODIUM CHLORIDE 0.9 % IV SOLN
10.0000 mg | Freq: Once | INTRAVENOUS | Status: AC
  Administered 2019-09-13: 10 mg via INTRAVENOUS
  Filled 2019-09-13: qty 10

## 2019-09-13 MED ORDER — DIPHENHYDRAMINE HCL 50 MG/ML IJ SOLN
INTRAMUSCULAR | Status: AC
  Filled 2019-09-13: qty 1

## 2019-09-13 MED ORDER — SODIUM CHLORIDE 0.9 % IV SOLN
80.0000 mg/m2 | Freq: Once | INTRAVENOUS | Status: AC
  Administered 2019-09-13: 138 mg via INTRAVENOUS
  Filled 2019-09-13: qty 23

## 2019-09-13 MED ORDER — SODIUM CHLORIDE 0.9% FLUSH
10.0000 mL | Freq: Once | INTRAVENOUS | Status: AC
  Administered 2019-09-13: 10 mL via INTRAVENOUS
  Filled 2019-09-13: qty 10

## 2019-09-13 MED ORDER — ACETAMINOPHEN 325 MG PO TABS
ORAL_TABLET | ORAL | Status: AC
  Filled 2019-09-13: qty 2

## 2019-09-13 MED ORDER — FAMOTIDINE IN NACL 20-0.9 MG/50ML-% IV SOLN
INTRAVENOUS | Status: AC
  Filled 2019-09-13: qty 50

## 2019-09-13 MED ORDER — DIPHENHYDRAMINE HCL 50 MG/ML IJ SOLN
25.0000 mg | Freq: Once | INTRAMUSCULAR | Status: AC
  Administered 2019-09-13: 25 mg via INTRAVENOUS

## 2019-09-15 ENCOUNTER — Other Ambulatory Visit: Payer: Self-pay

## 2019-09-15 ENCOUNTER — Ambulatory Visit: Payer: BC Managed Care – PPO

## 2019-09-15 DIAGNOSIS — Z483 Aftercare following surgery for neoplasm: Secondary | ICD-10-CM

## 2019-09-15 DIAGNOSIS — M25612 Stiffness of left shoulder, not elsewhere classified: Secondary | ICD-10-CM

## 2019-09-15 DIAGNOSIS — C50412 Malignant neoplasm of upper-outer quadrant of left female breast: Secondary | ICD-10-CM | POA: Diagnosis not present

## 2019-09-15 DIAGNOSIS — R293 Abnormal posture: Secondary | ICD-10-CM

## 2019-09-15 NOTE — Therapy (Signed)
[place], [place], [address] Phone: [phone]   Fax:  [phone]  Physical Therapy Treatment  Patient Details  Name: [name] MRN: [id number] Date of Birth: May 26, 1965 Referring Provider (PT): Dr. Rolm Bookbinder   Encounter Date: 09/15/2019  PT End of Session - 09/15/19 1106    Visit Number  3    Number of Visits  6    Date for PT Re-Evaluation  10/07/19    PT Start Time  1000    PT Stop Time  1105    PT Time Calculation (min)  65 min    Activity Tolerance  Patient tolerated treatment well    Behavior During Therapy  [facility] for tasks assessed/performed       Past Medical History:  Diagnosis Date  . Arthritis   . Cancer [facility])    Left breast  . Headache    migraine     Past Surgical History:  Procedure Laterality Date  . ARTERY BIOPSY Right 11/11/2018   Procedure: BIOPSY TEMPORAL ARTERY;  Surgeon: Rosetta Posner, MD;  Location: [place];  Service: Vascular;  Laterality: Right;  . BREAST LUMPECTOMY WITH RADIOACTIVE SEED AND SENTINEL LYMPH NODE BIOPSY Left 08/05/2019   Procedure: LEFT BREAST LUMPECTOMY WITH RADIOACTIVE SEED AND LEFT AXILLARY SENTINEL LYMPH NODE BIOPSY;  Surgeon: Rolm Bookbinder, MD;  Location: [place];  Service: General;  Laterality: Left;  PEC BLOCK  . CESAREAN SECTION     x2  . PORTACATH PLACEMENT Right 08/05/2019   Procedure: INSERTION PORT-A-CATH WITH ULTRASOUND GUIDANCE;  Surgeon: Rolm Bookbinder, MD;  Location: [place];  Service: General;  Laterality: Right;    There were no vitals filed for this visit.  Subjective Assessment - 09/15/19 1002    Subjective  I've been doing the stretches she gave me last time. they are going fine but Ihaven't noticed a huge difference as of yet. I only have pain when I move my arm wrong (reaching out the window in a drive thru).    Pertinent History  Patient was diagnosed on 07/12/2019 with left grade II invasive ductal carcinoma breast  cancer.  It is ER/PR negative and HER2 positive with a Ki67 of 90%. Patient underwent a left lumpectomy and sentinel node biopsy (1 negative node) on 08/05/2019.    Patient Stated Goals  Get my arm moving better    Currently in Pain?  No/denies                        [facility] Adult PT Treatment/Exercise - 09/15/19 0001      Self-Care   Self-Care  Other Self-Care Comments    Other Self-Care Comments   Educated pt in lymphedema risk reduction practices and infection prevention during manual therapy, answering her questions throughout      Manual Therapy   Manual Therapy  Myofascial release;Passive ROM    Myofascial Release  To Lt axilla during P/ROM, also to Lt chest wall with cross hands technique horizontally    Passive ROM  In Supine to Lt shoulder into flexion, abduction and D2 to pts toelrance             PT Education - 09/15/19 1247    Education Details  Educated pt in lymphedema risk reduction practices and infection prevention    Person(s) Educated  Patient    Methods  Explanation;Handout    Comprehension  Verbalized understanding  PT Long Term Goals - 09/09/19 1056      PT LONG TERM GOAL #1   Title  Patient will demonstrate she has regained full shoulder ROM and function post operatively compared to baselines.    Time  4    Period  Weeks    Status  On-going    Target Date  10/07/19      PT LONG TERM GOAL #2   Title  Patient will increase left shoulder active flexion to >/= 140 degrees to increased ease reaching.    Baseline  130 post op; 140 baseline    Time  4    Period  Weeks    Target Date  10/07/19      PT LONG TERM GOAL #3   Title  Patient will increase left shoulder active abduction to >/= 150 degrees to increased ease reaching.    Baseline  125 post op; 157 baseline    Time  4    Period  Weeks    Status  New    Target Date  10/07/19      PT LONG TERM GOAL #4   Title  Patient will decrease DASH score to </= 8 for improved  overall upper extremity function.    Baseline  4.55 baseline; 20.45 post op    Time  4    Period  Weeks    Status  New    Target Date  10/07/19      PT LONG TERM GOAL #5   Title  Patient will verbalize good understanding of risk reduction practices for lymphedema.    Time  4    Period  Weeks    Status  New    Target Date  10/07/19            Plan - 09/15/19 1248    Clinical Impression Statement  First session of manual therapy to focus on promoting increased end Lt shoulder P/ROM and MFR to Lt axilla as well. Also educated her in regards to lymphedema risk reduction (see flowsheet) during manual therapy ansewring her questions throughout. While stretching her today also was instructing her how to incoporate end ROM stretching in doorway throughout day. Pt verbalized understanding all and reported feeling much looser in Lt upper quadrant at end of session. Also issued script for compression bra as this will beneficial when she goes thru radiation, and for a prophylactic compression sleeve.    Stability/Clinical Decision Making  Stable/Uncomplicated    Rehab Potential  Excellent    PT Frequency  1x / week    PT Duration  4 weeks    PT Treatment/Interventions  ADLs/Self Care Home Management;Therapeutic exercise;Patient/family education    PT Next Visit Plan  Schedule for L-Dex screen around 11/04/2019 (forgot today); PROM left shoulder; myofascial techniques to left medial upper arm and axilla; progress HEP to include supine scapular series    PT Home Exercise Plan  Closed chain shoulder flexion and abduction; end ROM stretching in doorway    Consulted and Agree with Plan of Care  Patient       Patient will benefit from skilled therapeutic intervention in order to improve the following deficits and impairments:  Postural dysfunction, Decreased range of motion, Pain, Impaired UE functional use, Decreased knowledge of precautions  Visit Diagnosis: Malignant neoplasm of upper-outer  quadrant of left breast in female, estrogen receptor negative ([facility])  Abnormal posture  Aftercare following surgery for neoplasm  Stiffness of left shoulder, not elsewhere classified  Problem List Patient Active Problem List   Diagnosis Date Noted  . Malignant neoplasm of upper-outer quadrant of left breast in female, estrogen receptor negative ([place]) 07/27/2019  . Neuroretinitis of both eyes 11/14/2018  . Retro-orbital pain   . Papilledema     Otelia Limes, PTA 09/15/2019, 12:53 PM  [place], [place], [address] Phone: [phone]   Fax:  [phone]  Name: [name] MRN: [id number] Date of Birth: [birth year]

## 2019-09-16 ENCOUNTER — Telehealth: Payer: Self-pay | Admitting: Medical Oncology

## 2019-09-16 NOTE — Telephone Encounter (Signed)
DCP-001 Use of a Clinical Trial Screening Tool to address Cancer Health Disparities in the [facility]) I called patient to introduce study to her and to inquire as to whether she would be interested in participating. I informed patient that with her referral to [phone] she is eligible for participation to this study. Patient was informed that participation in this study is voluntary and patient gave her verbal understanding. I explained to the patient that the study includes individuals regardless of their decision to participate in an NCI clinical trial. The patient is aware that this involves a one-time consent, and the collection of demographic variables, with the majority of data collected from her medical record. It was noted to the patient, that no patient identifiers are being reported via the screening tool. The study phone consent script was used with the patient over the phone and patient confirms all of her questions were answered to her satisfaction. Upon completion of consent and study review, patient gave her verbal consent to participate in this study. Once patient provided her verbal consent for participation, I reviewed with her a few demographic questions that would not be found in her medical record. Patient was thanked for her time and willingness to participate and encouraged to call me with questions. Patient will be provided a copy of the study consent form for her records at her upcoming visit.   Patient is eligible and will be enrolled into this study. Maxwell Marion, RN, BSN, [facility] Clinical Research 09/16/2019 3:59 PM

## 2019-09-21 ENCOUNTER — Ambulatory Visit: Payer: BC Managed Care – PPO

## 2019-09-21 ENCOUNTER — Other Ambulatory Visit: Payer: BC Managed Care – PPO

## 2019-09-21 NOTE — Progress Notes (Signed)
Pharmacist Chemotherapy Monitoring - Follow Up Assessment    I verify that I have reviewed each item in the below checklist:   Regimen for the patient is scheduled for the appropriate day and plan matches scheduled date.  Appropriate non-routine labs are ordered dependent on drug ordered.  If applicable, additional medications reviewed and ordered per protocol based on lifetime cumulative doses and/or treatment regimen.   Plan for follow-up and/or issues identified: No  I-vent associated with next due treatment: No  MD and/or nursing notified: No   Kennith Center, Pharm.D., CPP 09/21/2019@3 :29 PM

## 2019-09-21 NOTE — Progress Notes (Signed)
Patient Care Team: Antony Blackbird, MD as PCP - General (Family Medicine) Mauro Kaufmann, RN as Oncology Nurse Navigator Rockwell Germany, RN as Oncology Nurse Navigator Rolm Bookbinder, MD as Consulting Physician (General Surgery) Nicholas Lose, MD as Consulting Physician (Hematology and Oncology) Kyung Rudd, MD as Consulting Physician (Radiation Oncology)  DIAGNOSIS:    ICD-10-CM   1. Malignant neoplasm of upper-outer quadrant of left breast in female, estrogen receptor negative ([place])  C50.412    Z17.1     SUMMARY OF ONCOLOGIC HISTORY: Oncology History  Malignant neoplasm of upper-outer quadrant of left breast in female, estrogen receptor negative ([place])  07/27/2019 Initial Diagnosis   Screening mammogram showed a left breast asymmetry. Diagnostic mammogram and US showed a 1.2cm left breast mass, 12:30 position, no left axillary adenopathy. 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   08/05/2019 Surgery   Left lumpectomy: Grade 3 IDC, 2 cm with high-grade DCIS, lymphovascular invasion was identified, 1 lymph node negative, margins negative, ER 0%, PR 0%, HER-2 positive, Ki-67 90%   08/05/2019 Cancer Staging   Staging form: Breast, AJCC 8th Edition - Pathologic stage from 08/05/2019: Stage IIA (pT2, pN0, cM0, G3, ER-, PR-, HER2+) - Signed by Gardenia Phlegm, NP on 08/18/2019   08/30/2019 -  Chemotherapy   The patient had PACLitaxel (TAXOL) 138 mg in sodium chloride 0.9 % 250 mL chemo infusion (</= 43m/m2), 80 mg/m2 = 138 mg, Intravenous,  Once, 1 of 3 cycles Administration: 138 mg (08/30/2019), 138 mg (09/06/2019), 138 mg (09/13/2019) trastuzumab-dkst (OGIVRI) 273 mg in sodium chloride 0.9 % 250 mL chemo infusion, 4 mg/kg = 273 mg, Intravenous,  Once, 1 of 16 cycles Administration: 273 mg (08/30/2019), 150 mg (09/06/2019), 150 mg (09/13/2019)  for chemotherapy treatment.      CHIEF COMPLIANT: Cycle 4 Taxol Herceptin  INTERVAL HISTORY: [name] is a 54y.o. with above-mentioned history of left breast cancerwhounderwent a lumpectomyand is currently on adjuvant chemotherapy with Taxol Herceptin.She is a participant in the neuropathy research study.She presents to the clinic todayfor a toxicity check and cycle 4.   ALLERGIES:  has No Known Allergies.  MEDICATIONS:  Current Outpatient Medications  Medication Sig Dispense Refill  . ibuprofen (ADVIL) 200 MG tablet Take 400-600 mg by mouth every 8 (eight) hours as needed for moderate pain.     .Marland Kitchenlidocaine-prilocaine (EMLA) cream Apply to affected area once 30 g 3  . LORazepam (ATIVAN) 0.5 MG tablet Take 1 tablet (0.5 mg total) by mouth at bedtime as needed (Nausea or vomiting). 30 tablet 1  . Multiple Vitamin (MULTIVITAMIN WITH MINERALS) TABS tablet Take 1 tablet by mouth daily. One-A-Day for Women 50+    . ondansetron (ZOFRAN) 8 MG tablet Take 1 tablet (8 mg total) by mouth 2 (two) times daily as needed (Nausea or vomiting). 30 tablet 1  . prochlorperazine (COMPAZINE) 10 MG tablet TAKE 1 TABLET(10 MG) BY MOUTH EVERY 6 HOURS AS NEEDED FOR NAUSEA OR VOMITING 30 tablet 1   No current facility-administered medications for this visit.    PHYSICAL EXAMINATION: ECOG PERFORMANCE STATUS: 1 - Symptomatic but completely ambulatory  Vitals:   09/22/19 1132  BP: (!) 149/94  Pulse: 89  Resp: 20  Temp: 98.9 F (37.2 C)  SpO2: 100%   Filed Weights   09/22/19 1132  Weight: 154 lb 11.2 oz (70.2 kg)    LABORATORY DATA:  I have reviewed the data as listed CMP Latest Ref  Rng & Units 09/13/2019 09/06/2019 08/30/2019  Glucose 70 - 99 mg/dL 92 100(H) 90  BUN 6 - 20 mg/dL 16 18 14   Creatinine 0.44 - 1.00 mg/dL 0.73 0.72 0.71  Sodium 135 - 145 mmol/L 142 141 143  Potassium 3.5 - 5.1 mmol/L 4.0 4.2 4.2  Chloride 98 - 111 mmol/L 110 109 109  CO2 22 - 32 mmol/L 24 22 23   Calcium 8.9 - 10.3 mg/dL 8.6(L) 8.7(L) 9.0  Total Protein 6.5 - 8.1 g/dL 6.4(L) 6.6 6.9  Total Bilirubin 0.3 - 1.2 mg/dL  <0.2(L) <0.2(L) 0.4  Alkaline Phos 38 - 126 U/L 77 86 92  AST 15 - 41 U/L 15 24 17   ALT 0 - 44 U/L 22 36 17    Lab Results  Component Value Date   WBC 3.6 (L) 09/13/2019   HGB 12.8 09/13/2019   HCT 38.8 09/13/2019   MCV 93.9 09/13/2019   PLT 299 09/13/2019   NEUTROABS 1.7 09/13/2019    ASSESSMENT & PLAN:  Malignant neoplasm of upper-outer quadrant of left breast in female, estrogen receptor negative ([place]) 07/27/2019:Screening mammogram showed a left breast asymmetry. Diagnostic mammogram and US showed a 1.2cm left breast mass, 12:30 position, no left axillary adenopathy. 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 T1c N0 stage Ia clinical stage  Recommendation: 1.08/05/2019:Left lumpectomy: Grade 3 IDC, 2 cm with high-grade DCIS, lymphovascular invasion was identified, 1 lymph node negative, margins negative, ER 0%, PR 0%, HER-2 positive, Ki-67 90% 2.Adjuvant chemotherapy with Taxol Herceptin followed by Herceptin maintenance for 1 yearstarted 08/30/2019 3.Adjuvant radiation therapy Patient is participating inSWOG S 1714neuropathy clinical trial --------------------------------------------------------------------------------------------------------------------------------------------------------------- Current treatment: Cycle 4 Taxol Herceptin Echocardiogram: 08/02/2019: EF 60 to 65%  Chemo toxicities: Denies any major side effects to chemo so far. Mild fatigue for a day or 2.  She had difficulty sleeping on the day of chemo because of steroids. Diarrhea on day 2  Return to clinic weekly for Taxol every other week for follow-up with me.   No orders of the defined types were placed in this encounter.  The patient has a good understanding of the overall plan. she agrees with it. she will call with any problems that may develop before the next visit here.  Total time spent: 30 mins including face to face time and time spent for planning,  charting and coordination of care  Nicholas Lose, MD 09/22/2019  I, Cloyde Reams Dorshimer, am acting as scribe for Dr. Nicholas Lose.  I have reviewed the above documentation for accuracy and completeness, and I agree with the above.

## 2019-09-22 ENCOUNTER — Inpatient Hospital Stay: Payer: BC Managed Care – PPO | Admitting: Hematology and Oncology

## 2019-09-22 ENCOUNTER — Inpatient Hospital Stay: Payer: BC Managed Care – PPO

## 2019-09-22 ENCOUNTER — Inpatient Hospital Stay: Payer: BC Managed Care – PPO | Attending: Hematology and Oncology

## 2019-09-22 ENCOUNTER — Other Ambulatory Visit: Payer: Self-pay

## 2019-09-22 DIAGNOSIS — Z171 Estrogen receptor negative status [ER-]: Secondary | ICD-10-CM | POA: Diagnosis not present

## 2019-09-22 DIAGNOSIS — C50412 Malignant neoplasm of upper-outer quadrant of left female breast: Secondary | ICD-10-CM | POA: Insufficient documentation

## 2019-09-22 DIAGNOSIS — Z5112 Encounter for antineoplastic immunotherapy: Secondary | ICD-10-CM | POA: Insufficient documentation

## 2019-09-22 DIAGNOSIS — Z006 Encounter for examination for normal comparison and control in clinical research program: Secondary | ICD-10-CM | POA: Insufficient documentation

## 2019-09-22 LAB — CBC WITH DIFFERENTIAL (CANCER CENTER ONLY)
Abs Immature Granulocytes: 0.03 10*3/uL (ref 0.00–0.07)
Basophils Absolute: 0 10*3/uL (ref 0.0–0.1)
Basophils Relative: 1 %
Eosinophils Absolute: 0.1 10*3/uL (ref 0.0–0.5)
Eosinophils Relative: 1 %
HCT: 40.2 % (ref 36.0–46.0)
Hemoglobin: 13.3 g/dL (ref 12.0–15.0)
Immature Granulocytes: 1 %
Lymphocytes Relative: 43 %
Lymphs Abs: 1.8 10*3/uL (ref 0.7–4.0)
MCH: 30.5 pg (ref 26.0–34.0)
MCHC: 33.1 g/dL (ref 30.0–36.0)
MCV: 92.2 fL (ref 80.0–100.0)
Monocytes Absolute: 0.4 10*3/uL (ref 0.1–1.0)
Monocytes Relative: 10 %
Neutro Abs: 1.8 10*3/uL (ref 1.7–7.7)
Neutrophils Relative %: 44 %
Platelet Count: 285 10*3/uL (ref 150–400)
RBC: 4.36 MIL/uL (ref 3.87–5.11)
RDW: 13 % (ref 11.5–15.5)
WBC Count: 4.1 10*3/uL (ref 4.0–10.5)
nRBC: 0 % (ref 0.0–0.2)

## 2019-09-22 LAB — CMP (CANCER CENTER ONLY)
ALT: 20 U/L (ref 0–44)
AST: 17 U/L (ref 15–41)
Albumin: 3.8 g/dL (ref 3.5–5.0)
Alkaline Phosphatase: 97 U/L (ref 38–126)
Anion gap: 10 (ref 5–15)
BUN: 14 mg/dL (ref 6–20)
CO2: 23 mmol/L (ref 22–32)
Calcium: 9.1 mg/dL (ref 8.9–10.3)
Chloride: 108 mmol/L (ref 98–111)
Creatinine: 0.77 mg/dL (ref 0.44–1.00)
GFR, Est AFR Am: >60 mL/min (ref >60)
GFR, Estimated: >60 mL/min (ref >60)
Glucose, Bld: 96 mg/dL (ref 70–99)
Potassium: 4 mmol/L (ref 3.5–5.1)
Sodium: 141 mmol/L (ref 135–145)
Total Bilirubin: <0.2 mg/dL — ABNORMAL LOW (ref 0.3–1.2)
Total Protein: 6.8 g/dL (ref 6.5–8.1)

## 2019-09-22 MED ORDER — SODIUM CHLORIDE 0.9 % IV SOLN
Freq: Once | INTRAVENOUS | Status: AC
  Filled 2019-09-22: qty 250

## 2019-09-22 MED ORDER — SODIUM CHLORIDE 0.9 % IV SOLN
10.0000 mg | Freq: Once | INTRAVENOUS | Status: AC
  Administered 2019-09-22: 10 mg via INTRAVENOUS
  Filled 2019-09-22: qty 10

## 2019-09-22 MED ORDER — SODIUM CHLORIDE 0.9 % IV SOLN
80.0000 mg/m2 | Freq: Once | INTRAVENOUS | Status: AC
  Administered 2019-09-22: 138 mg via INTRAVENOUS
  Filled 2019-09-22: qty 23

## 2019-09-22 MED ORDER — TRASTUZUMAB-DKST CHEMO 150 MG IV SOLR
150.0000 mg | Freq: Once | INTRAVENOUS | Status: AC
  Administered 2019-09-22: 150 mg via INTRAVENOUS
  Filled 2019-09-22: qty 7.14

## 2019-09-22 MED ORDER — ACETAMINOPHEN 325 MG PO TABS
ORAL_TABLET | ORAL | Status: AC
  Filled 2019-09-22: qty 2

## 2019-09-22 MED ORDER — HEPARIN SOD (PORK) LOCK FLUSH 100 UNIT/ML IV SOLN
500.0000 [IU] | Freq: Once | INTRAVENOUS | Status: AC | PRN
  Administered 2019-09-22: 500 [IU]
  Filled 2019-09-22: qty 5

## 2019-09-22 MED ORDER — SODIUM CHLORIDE 0.9% FLUSH
10.0000 mL | INTRAVENOUS | Status: DC | PRN
  Administered 2019-09-22: 10 mL
  Filled 2019-09-22: qty 10

## 2019-09-22 MED ORDER — FAMOTIDINE IN NACL 20-0.9 MG/50ML-% IV SOLN
INTRAVENOUS | Status: AC
  Filled 2019-09-22: qty 50

## 2019-09-22 MED ORDER — FAMOTIDINE IN NACL 20-0.9 MG/50ML-% IV SOLN
20.0000 mg | Freq: Once | INTRAVENOUS | Status: AC
  Administered 2019-09-22: 20 mg via INTRAVENOUS

## 2019-09-22 MED ORDER — ACETAMINOPHEN 325 MG PO TABS
650.0000 mg | ORAL_TABLET | Freq: Once | ORAL | Status: AC
  Administered 2019-09-22: 650 mg via ORAL

## 2019-09-22 MED ORDER — DIPHENHYDRAMINE HCL 50 MG/ML IJ SOLN
25.0000 mg | Freq: Once | INTRAMUSCULAR | Status: AC
  Administered 2019-09-22: 25 mg via INTRAVENOUS

## 2019-09-22 MED ORDER — DIPHENHYDRAMINE HCL 50 MG/ML IJ SOLN
INTRAMUSCULAR | Status: AC
  Filled 2019-09-22: qty 1

## 2019-09-22 NOTE — Assessment & Plan Note (Signed)
07/27/2019:Screening mammogram showed a left breast asymmetry. Diagnostic mammogram and US showed a 1.2cm left breast mass, 12:30 position, no left axillary adenopathy. 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 T1c N0 stage Ia clinical stage  Recommendation: 1.08/05/2019:Left lumpectomy: Grade 3 IDC, 2 cm with high-grade DCIS, lymphovascular invasion was identified, 1 lymph node negative, margins negative, ER 0%, PR 0%, HER-2 positive, Ki-67 90% 2.Adjuvant chemotherapy with Taxol Herceptin followed by Herceptin maintenance for 1 yearstarted 08/30/2019 3.Adjuvant radiation therapy Patient is participating inSWOG S 1714neuropathy clinical trial --------------------------------------------------------------------------------------------------------------------------------------------------------------- Current treatment: Cycle 4 Taxol Herceptin Echocardiogram: 08/02/2019: EF 60 to 65%  Chemo toxicities: Denies any side effects to chemo so far. Mild fatigue for a day or 2.  She had difficulty sleeping on the day of chemo because of steroids.   Return to clinic weekly for Taxol every other week for follow-up with me.

## 2019-09-22 NOTE — Progress Notes (Signed)
Patient Care Team: Antony Blackbird, MD as PCP - General (Family Medicine) Mauro Kaufmann, RN as Oncology Nurse Navigator Rockwell Germany, RN as Oncology Nurse Navigator Rolm Bookbinder, MD as Consulting Physician (General Surgery) Nicholas Lose, MD as Consulting Physician (Hematology and Oncology) Kyung Rudd, MD as Consulting Physician (Radiation Oncology)  DIAGNOSIS:    ICD-10-CM   1. Malignant neoplasm of upper-outer quadrant of left breast in female, estrogen receptor negative ([place])  C50.412    Z17.1     SUMMARY OF ONCOLOGIC HISTORY: Oncology History  Malignant neoplasm of upper-outer quadrant of left breast in female, estrogen receptor negative ([place])  07/27/2019 Initial Diagnosis   Screening mammogram showed a left breast asymmetry. Diagnostic mammogram and US showed a 1.2cm left breast mass, 12:30 position, no left axillary adenopathy. 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   08/05/2019 Surgery   Left lumpectomy: Grade 3 IDC, 2 cm with high-grade DCIS, lymphovascular invasion was identified, 1 lymph node negative, margins negative, ER 0%, PR 0%, HER-2 positive, Ki-67 90%   08/05/2019 Cancer Staging   Staging form: Breast, AJCC 8th Edition - Pathologic stage from 08/05/2019: Stage IIA (pT2, pN0, cM0, G3, ER-, PR-, HER2+) - Signed by Gardenia Phlegm, NP on 08/18/2019   08/30/2019 -  Chemotherapy   The patient had PACLitaxel (TAXOL) 138 mg in sodium chloride 0.9 % 250 mL chemo infusion (</= 30m/m2), 80 mg/m2 = 138 mg, Intravenous,  Once, 1 of 3 cycles Administration: 138 mg (08/30/2019), 138 mg (09/06/2019), 138 mg (09/13/2019) trastuzumab-dkst (OGIVRI) 273 mg in sodium chloride 0.9 % 250 mL chemo infusion, 4 mg/kg = 273 mg, Intravenous,  Once, 1 of 16 cycles Administration: 273 mg (08/30/2019), 150 mg (09/06/2019), 150 mg (09/13/2019)  for chemotherapy treatment.      CHIEF COMPLIANT: Cycle 4 Taxol Herceptin  INTERVAL HISTORY: [name] is a 54y.o. with above-mentioned history of left breast cancerwhounderwent a lumpectomyand is currently on adjuvant chemotherapy with Taxol Herceptin.She is a participant in the neuropathy research study.She presents to the clinic todayfor a toxicity check and cycle 4. She has loose stools on the day after treatment.  Otherwise tolerating it well.  Denies neuropathy.  ALLERGIES:  has No Known Allergies.  MEDICATIONS:  Current Outpatient Medications  Medication Sig Dispense Refill  . ibuprofen (ADVIL) 200 MG tablet Take 400-600 mg by mouth every 8 (eight) hours as needed for moderate pain.     .Marland Kitchenlidocaine-prilocaine (EMLA) cream Apply to affected area once 30 g 3  . LORazepam (ATIVAN) 0.5 MG tablet Take 1 tablet (0.5 mg total) by mouth at bedtime as needed (Nausea or vomiting). 30 tablet 1  . Multiple Vitamin (MULTIVITAMIN WITH MINERALS) TABS tablet Take 1 tablet by mouth daily. One-A-Day for Women 50+    . ondansetron (ZOFRAN) 8 MG tablet Take 1 tablet (8 mg total) by mouth 2 (two) times daily as needed (Nausea or vomiting). 30 tablet 1  . prochlorperazine (COMPAZINE) 10 MG tablet TAKE 1 TABLET(10 MG) BY MOUTH EVERY 6 HOURS AS NEEDED FOR NAUSEA OR VOMITING 30 tablet 1   No current facility-administered medications for this visit.    PHYSICAL EXAMINATION: ECOG PERFORMANCE STATUS: 1 - Symptomatic but completely ambulatory  Vitals:   09/22/19 1132  BP: (!) 149/94  Pulse: 89  Resp: 20  Temp: 98.9 F (37.2 C)  SpO2: 100%   Filed Weights   09/22/19 1132  Weight: 154 lb 11.2 oz (70.2 kg)  LABORATORY DATA:  I have reviewed the data as listed CMP Latest Ref Rng & Units 09/13/2019 09/06/2019 08/30/2019  Glucose 70 - 99 mg/dL 92 100(H) 90  BUN 6 - 20 mg/dL 16 18 14   Creatinine 0.44 - 1.00 mg/dL 0.73 0.72 0.71  Sodium 135 - 145 mmol/L 142 141 143  Potassium 3.5 - 5.1 mmol/L 4.0 4.2 4.2  Chloride 98 - 111 mmol/L 110 109 109  CO2 22 - 32 mmol/L 24 22 23   Calcium 8.9 - 10.3 mg/dL  8.6(L) 8.7(L) 9.0  Total Protein 6.5 - 8.1 g/dL 6.4(L) 6.6 6.9  Total Bilirubin 0.3 - 1.2 mg/dL <0.2(L) <0.2(L) 0.4  Alkaline Phos 38 - 126 U/L 77 86 92  AST 15 - 41 U/L 15 24 17   ALT 0 - 44 U/L 22 36 17    Lab Results  Component Value Date   WBC 3.6 (L) 09/13/2019   HGB 12.8 09/13/2019   HCT 38.8 09/13/2019   MCV 93.9 09/13/2019   PLT 299 09/13/2019   NEUTROABS 1.7 09/13/2019    ASSESSMENT & PLAN:  Malignant neoplasm of upper-outer quadrant of left breast in female, estrogen receptor negative ([place]) 07/27/2019:Screening mammogram showed a left breast asymmetry. Diagnostic mammogram and US showed a 1.2cm left breast mass, 12:30 position, no left axillary adenopathy. 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 T1c N0 stage Ia clinical stage  Recommendation: 1.08/05/2019:Left lumpectomy: Grade 3 IDC, 2 cm with high-grade DCIS, lymphovascular invasion was identified, 1 lymph node negative, margins negative, ER 0%, PR 0%, HER-2 positive, Ki-67 90% 2.Adjuvant chemotherapy with Taxol Herceptin followed by Herceptin maintenance for 1 yearstarted 08/30/2019 3.Adjuvant radiation therapy Patient is participating inSWOG S 1714neuropathy clinical trial --------------------------------------------------------------------------------------------------------------------------------------------------------------- Current treatment: Cycle 4 Taxol Herceptin Echocardiogram: 08/02/2019: EF 60 to 65%  Chemo toxicities: Denies any major side effects to chemo so far. Mild fatigue for a day or 2.  She had difficulty sleeping on the day of chemo because of steroids. Diarrhea on day 2  Return to clinic weekly for Taxol every other week for follow-up with me.   No orders of the defined types were placed in this encounter.  The patient has a good understanding of the overall plan. she agrees with it. she will call with any problems that may develop before the next visit  here.  Total time spent: 30 mins including face to face time and time spent for planning, charting and coordination of care  Nicholas Lose, MD 09/22/2019  I, Cloyde Reams Dorshimer, am acting as scribe for Dr. Nicholas Lose.  I have reviewed the above documentation for accuracy and completeness, and I agree with the above.

## 2019-09-22 NOTE — Patient Instructions (Signed)
[facility] Discharge Instructions for Patients Receiving Chemotherapy  Today you received the following chemotherapy agents: Traztuzumab, Taxol  To help prevent nausea and vomiting after your treatment, we encourage you to take your nausea medication as directed.    If you develop nausea and vomiting that is not controlled by your nausea medication, call the clinic.   BELOW ARE SYMPTOMS THAT SHOULD BE REPORTED IMMEDIATELY:  *FEVER GREATER THAN 100.5 F  *CHILLS WITH OR WITHOUT FEVER  NAUSEA AND VOMITING THAT IS NOT CONTROLLED WITH YOUR NAUSEA MEDICATION  *UNUSUAL SHORTNESS OF BREATH  *UNUSUAL BRUISING OR BLEEDING  TENDERNESS IN MOUTH AND THROAT WITH OR WITHOUT PRESENCE OF ULCERS  *URINARY PROBLEMS  *BOWEL PROBLEMS  UNUSUAL RASH Items with * indicate a potential emergency and should be followed up as soon as possible.  Feel free to call the clinic should you have any questions or concerns. The clinic phone number is [phone].  Please show the [place] at check-in to the Emergency Department and triage nurse.

## 2019-09-27 ENCOUNTER — Other Ambulatory Visit: Payer: BC Managed Care – PPO

## 2019-09-27 ENCOUNTER — Ambulatory Visit: Payer: BC Managed Care – PPO

## 2019-09-27 ENCOUNTER — Ambulatory Visit: Payer: BC Managed Care – PPO | Admitting: Hematology and Oncology

## 2019-09-28 ENCOUNTER — Other Ambulatory Visit: Payer: Self-pay

## 2019-09-28 ENCOUNTER — Ambulatory Visit: Payer: BC Managed Care – PPO | Attending: General Surgery

## 2019-09-28 DIAGNOSIS — Z483 Aftercare following surgery for neoplasm: Secondary | ICD-10-CM | POA: Insufficient documentation

## 2019-09-28 DIAGNOSIS — M25612 Stiffness of left shoulder, not elsewhere classified: Secondary | ICD-10-CM | POA: Insufficient documentation

## 2019-09-28 DIAGNOSIS — R293 Abnormal posture: Secondary | ICD-10-CM | POA: Insufficient documentation

## 2019-09-28 DIAGNOSIS — C50412 Malignant neoplasm of upper-outer quadrant of left female breast: Secondary | ICD-10-CM | POA: Diagnosis present

## 2019-09-28 DIAGNOSIS — Z171 Estrogen receptor negative status [ER-]: Secondary | ICD-10-CM | POA: Insufficient documentation

## 2019-09-28 NOTE — Patient Instructions (Signed)

## 2019-09-28 NOTE — Therapy (Addendum)
[place], [place], [address] Phone: [phone]   Fax:  [phone]  Physical Therapy Treatment  Patient Details  Name: [name] MRN: [id number] Date of Birth: [birth year] Referring Provider (PT): Dr. Rolm Bookbinder   Encounter Date: 09/28/2019  PT End of Session - 09/28/19 1200    Visit Number  4    Number of Visits  6    Date for PT Re-Evaluation  10/07/19    PT Start Time  1108    PT Stop Time  1203    PT Time Calculation (min)  55 min    Activity Tolerance  Patient tolerated treatment well    Behavior During Therapy  [facility] for tasks assessed/performed       Past Medical History:  Diagnosis Date  . Arthritis   . Cancer [facility])    Left breast  . Headache    migraine     Past Surgical History:  Procedure Laterality Date  . ARTERY BIOPSY Right 11/11/2018   Procedure: BIOPSY TEMPORAL ARTERY;  Surgeon: Rosetta Posner, MD;  Location: [place];  Service: Vascular;  Laterality: Right;  . BREAST LUMPECTOMY WITH RADIOACTIVE SEED AND SENTINEL LYMPH NODE BIOPSY Left 08/05/2019   Procedure: LEFT BREAST LUMPECTOMY WITH RADIOACTIVE SEED AND LEFT AXILLARY SENTINEL LYMPH NODE BIOPSY;  Surgeon: Rolm Bookbinder, MD;  Location: [place];  Service: General;  Laterality: Left;  PEC BLOCK  . CESAREAN SECTION     x2  . PORTACATH PLACEMENT Right 08/05/2019   Procedure: INSERTION PORT-A-CATH WITH ULTRASOUND GUIDANCE;  Surgeon: Rolm Bookbinder, MD;  Location: [place];  Service: General;  Laterality: Right;    There were no vitals filed for this visit.  Subjective Assessment - 09/28/19 1114    Subjective  The stretches are going well though I have noticed some pulling into my forerarm this past weekend. I'm not trying to overexert my arm, but I am using it.    Pertinent History  Patient was diagnosed on 07/12/2019 with left grade II invasive ductal carcinoma breast cancer.  It is ER/PR negative and HER2 positive with  a Ki67 of 90%. Patient underwent a left lumpectomy and sentinel node biopsy (1 negative node) on 08/05/2019.    Patient Stated Goals  Get my arm moving better    Currently in Pain?  No/denies                        [facility] Adult PT Treatment/Exercise - 09/28/19 0001      Shoulder Exercises: Supine   Horizontal ABduction  Strengthening;Both;10 reps;Theraband    Theraband Level (Shoulder Horizontal ABduction)  Level 2 (Red)    Horizontal ABduction Limitations  Returning therapist demo for each of supine scap series    External Rotation  Strengthening;Both;10 reps;Theraband    Theraband Level (Shoulder External Rotation)  Level 2 (Red)    Flexion  Strengthening;Both;5 reps;Theraband   Narrow and Wide Grip, 5 times each   Theraband Level (Shoulder Flexion)  Level 2 (Red)    Diagonals  Strengthening;Right;Left;5 reps;Theraband    Theraband Level (Shoulder Diagonals)  Level 2 (Red)      Shoulder Exercises: Pulleys   Flexion  2 minutes    Flexion Limitations  Returning therapist demo and VCs with demo to decrease Lt scapular compensation    ABduction  2 minutes    ABduction Limitations  VCs to decrease Lt scapular compensation      Shoulder Exercises: Therapy  Ball   Flexion  Both;10 reps   forward lean into end of stretch   Flexion Limitations  returned therapist demo for each    ABduction  Left;5 reps   same side lean into end of stretch     Manual Therapy   Manual Therapy  Myofascial release;Passive ROM    Myofascial Release  To Lt axilla during P/ROM, also to Lt chest wall with cross hands technique horizontally    Passive ROM  In Supine to Lt shoulder into flexion, abduction and D2 to pts toelrance             PT Education - 09/28/19 1123    Education Details  Supine scapular series with red theraband    Person(s) Educated  Patient    Methods  Explanation;Demonstration;Handout    Comprehension  Verbalized understanding;Returned demonstration           PT Long Term Goals - 09/09/19 1056      PT LONG TERM GOAL #1   Title  Patient will demonstrate she has regained full shoulder ROM and function post operatively compared to baselines.    Time  4    Period  Weeks    Status  On-going    Target Date  10/07/19      PT LONG TERM GOAL #2   Title  Patient will increase left shoulder active flexion to >/= 140 degrees to increased ease reaching.    Baseline  130 post op; 140 baseline    Time  4    Period  Weeks    Target Date  10/07/19      PT LONG TERM GOAL #3   Title  Patient will increase left shoulder active abduction to >/= 150 degrees to increased ease reaching.    Baseline  125 post op; 157 baseline    Time  4    Period  Weeks    Status  New    Target Date  10/07/19      PT LONG TERM GOAL #4   Title  Patient will decrease DASH score to </= 8 for improved overall upper extremity function.    Baseline  4.55 baseline; 20.45 post op    Time  4    Period  Weeks    Status  New    Target Date  10/07/19      PT LONG TERM GOAL #5   Title  Patient will verbalize good understanding of risk reduction practices for lymphedema.    Time  4    Period  Weeks    Status  New    Target Date  10/07/19            Plan - 09/28/19 1200    Clinical Impression Statement  Pt demonstrating excellent improvement with end P/ROM of Lt shoulder and repors stretch feeling better at end range now as well, less tight. Progressed HEP to include supine scapular series and also included AA/ROM today as well.    Stability/Clinical Decision Making  Stable/Uncomplicated    Rehab Potential  Excellent    PT Frequency  1x / week    PT Duration  4 weeks    PT Treatment/Interventions  ADLs/Self Care Home Management;Therapeutic exercise;Patient/family education    PT Next Visit Plan  Probable D/C next session per POC; reassess ROM and goals; progress HEP to include bil UE 3 way riases; cont PROM left shoulder; myofascial techniques to left medial  upper arm and axilla    PT  Home Exercise Plan  Closed chain shoulder flexion and abduction; end ROM stretching in doorway; supine scapular series    Consulted and Agree with Plan of Care  Patient       Patient will benefit from skilled therapeutic intervention in order to improve the following deficits and impairments:  Postural dysfunction, Decreased range of motion, Pain, Impaired UE functional use, Decreased knowledge of precautions  Visit Diagnosis: Malignant neoplasm of upper-outer quadrant of left breast in female, estrogen receptor negative ([facility])  Abnormal posture  Aftercare following surgery for neoplasm  Stiffness of left shoulder, not elsewhere classified     Problem List Patient Active Problem List   Diagnosis Date Noted  . Malignant neoplasm of upper-outer quadrant of left breast in female, estrogen receptor negative ([place]) 07/27/2019  . Neuroretinitis of both eyes 11/14/2018  . Retro-orbital pain   . Papilledema     Otelia Limes , PTA 09/28/2019, 12:17 PM  [place], [place], [address] Phone: [phone]   Fax:  [phone]  Name: [name] MRN: [id number] Date of Birth: 22-Mar-1966

## 2019-09-30 ENCOUNTER — Inpatient Hospital Stay: Payer: BC Managed Care – PPO

## 2019-09-30 ENCOUNTER — Other Ambulatory Visit: Payer: Self-pay

## 2019-09-30 VITALS — BP 135/75 | HR 92 | Temp 98.3°F | Resp 18

## 2019-09-30 DIAGNOSIS — Z5112 Encounter for antineoplastic immunotherapy: Secondary | ICD-10-CM | POA: Diagnosis not present

## 2019-09-30 DIAGNOSIS — C50412 Malignant neoplasm of upper-outer quadrant of left female breast: Secondary | ICD-10-CM

## 2019-09-30 LAB — CMP (CANCER CENTER ONLY)
ALT: 19 U/L (ref 0–44)
AST: 17 U/L (ref 15–41)
Albumin: 3.7 g/dL (ref 3.5–5.0)
Alkaline Phosphatase: 92 U/L (ref 38–126)
Anion gap: 11 (ref 5–15)
BUN: 16 mg/dL (ref 6–20)
CO2: 21 mmol/L — ABNORMAL LOW (ref 22–32)
Calcium: 8.4 mg/dL — ABNORMAL LOW (ref 8.9–10.3)
Chloride: 109 mmol/L (ref 98–111)
Creatinine: 0.79 mg/dL (ref 0.44–1.00)
GFR, Est AFR Am: >60 mL/min (ref >60)
GFR, Estimated: >60 mL/min (ref >60)
Glucose, Bld: 101 mg/dL — ABNORMAL HIGH (ref 70–99)
Potassium: 4.1 mmol/L (ref 3.5–5.1)
Sodium: 141 mmol/L (ref 135–145)
Total Bilirubin: <0.2 mg/dL — ABNORMAL LOW (ref 0.3–1.2)
Total Protein: 6.5 g/dL (ref 6.5–8.1)

## 2019-09-30 LAB — CBC WITH DIFFERENTIAL (CANCER CENTER ONLY)
Abs Immature Granulocytes: 0.06 10*3/uL (ref 0.00–0.07)
Basophils Absolute: 0 10*3/uL (ref 0.0–0.1)
Basophils Relative: 0 %
Eosinophils Absolute: 0.1 10*3/uL (ref 0.0–0.5)
Eosinophils Relative: 2 %
HCT: 38.6 % (ref 36.0–46.0)
Hemoglobin: 12.8 g/dL (ref 12.0–15.0)
Immature Granulocytes: 1 %
Lymphocytes Relative: 36 %
Lymphs Abs: 1.7 10*3/uL (ref 0.7–4.0)
MCH: 30.7 pg (ref 26.0–34.0)
MCHC: 33.2 g/dL (ref 30.0–36.0)
MCV: 92.6 fL (ref 80.0–100.0)
Monocytes Absolute: 0.5 10*3/uL (ref 0.1–1.0)
Monocytes Relative: 11 %
Neutro Abs: 2.4 10*3/uL (ref 1.7–7.7)
Neutrophils Relative %: 50 %
Platelet Count: 271 10*3/uL (ref 150–400)
RBC: 4.17 MIL/uL (ref 3.87–5.11)
RDW: 13.3 % (ref 11.5–15.5)
WBC Count: 4.7 10*3/uL (ref 4.0–10.5)
nRBC: 0 % (ref 0.0–0.2)

## 2019-09-30 MED ORDER — FAMOTIDINE IN NACL 20-0.9 MG/50ML-% IV SOLN
INTRAVENOUS | Status: AC
  Filled 2019-09-30: qty 50

## 2019-09-30 MED ORDER — SODIUM CHLORIDE 0.9 % IV SOLN
10.0000 mg | Freq: Once | INTRAVENOUS | Status: AC
  Administered 2019-09-30: 10 mg via INTRAVENOUS
  Filled 2019-09-30: qty 10

## 2019-09-30 MED ORDER — HEPARIN SOD (PORK) LOCK FLUSH 100 UNIT/ML IV SOLN
500.0000 [IU] | Freq: Once | INTRAVENOUS | Status: AC | PRN
  Administered 2019-09-30: 500 [IU]
  Filled 2019-09-30: qty 5

## 2019-09-30 MED ORDER — SODIUM CHLORIDE 0.9 % IV SOLN
Freq: Once | INTRAVENOUS | Status: AC
  Filled 2019-09-30: qty 250

## 2019-09-30 MED ORDER — TRASTUZUMAB-DKST CHEMO 150 MG IV SOLR
150.0000 mg | Freq: Once | INTRAVENOUS | Status: AC
  Administered 2019-09-30: 150 mg via INTRAVENOUS
  Filled 2019-09-30: qty 7.14

## 2019-09-30 MED ORDER — SODIUM CHLORIDE 0.9% FLUSH
10.0000 mL | INTRAVENOUS | Status: DC | PRN
  Administered 2019-09-30: 10 mL
  Filled 2019-09-30: qty 10

## 2019-09-30 MED ORDER — ACETAMINOPHEN 325 MG PO TABS
650.0000 mg | ORAL_TABLET | Freq: Once | ORAL | Status: AC
  Administered 2019-09-30: 650 mg via ORAL

## 2019-09-30 MED ORDER — FAMOTIDINE IN NACL 20-0.9 MG/50ML-% IV SOLN
20.0000 mg | Freq: Once | INTRAVENOUS | Status: AC
  Administered 2019-09-30: 20 mg via INTRAVENOUS

## 2019-09-30 MED ORDER — DIPHENHYDRAMINE HCL 50 MG/ML IJ SOLN
25.0000 mg | Freq: Once | INTRAMUSCULAR | Status: AC
  Administered 2019-09-30: 25 mg via INTRAVENOUS

## 2019-09-30 MED ORDER — DIPHENHYDRAMINE HCL 50 MG/ML IJ SOLN
INTRAMUSCULAR | Status: AC
  Filled 2019-09-30: qty 1

## 2019-09-30 MED ORDER — ACETAMINOPHEN 325 MG PO TABS
ORAL_TABLET | ORAL | Status: AC
  Filled 2019-09-30: qty 2

## 2019-09-30 MED ORDER — SODIUM CHLORIDE 0.9 % IV SOLN
80.0000 mg/m2 | Freq: Once | INTRAVENOUS | Status: AC
  Administered 2019-09-30: 138 mg via INTRAVENOUS
  Filled 2019-09-30: qty 23

## 2019-09-30 NOTE — Patient Instructions (Signed)
[facility] Discharge Instructions for Patients Receiving Chemotherapy  Today you received the following chemotherapy agents: Trastuzumab, Taxol  To help prevent nausea and vomiting after your treatment, we encourage you to take your nausea medication as directed.   If you develop nausea and vomiting that is not controlled by your nausea medication, call the clinic.   BELOW ARE SYMPTOMS THAT SHOULD BE REPORTED IMMEDIATELY:  *FEVER GREATER THAN 100.5 F  *CHILLS WITH OR WITHOUT FEVER  NAUSEA AND VOMITING THAT IS NOT CONTROLLED WITH YOUR NAUSEA MEDICATION  *UNUSUAL SHORTNESS OF BREATH  *UNUSUAL BRUISING OR BLEEDING  TENDERNESS IN MOUTH AND THROAT WITH OR WITHOUT PRESENCE OF ULCERS  *URINARY PROBLEMS  *BOWEL PROBLEMS  UNUSUAL RASH Items with * indicate a potential emergency and should be followed up as soon as possible.  Feel free to call the clinic should you have any questions or concerns. The clinic phone number is [phone].  Please show the CHEMO ALERT CARD at check-in to the Emergency Department and triage nurse.   

## 2019-10-06 ENCOUNTER — Other Ambulatory Visit: Payer: Self-pay

## 2019-10-06 ENCOUNTER — Telehealth: Payer: Self-pay | Admitting: Medical Oncology

## 2019-10-06 ENCOUNTER — Ambulatory Visit: Payer: BC Managed Care – PPO

## 2019-10-06 DIAGNOSIS — M25612 Stiffness of left shoulder, not elsewhere classified: Secondary | ICD-10-CM

## 2019-10-06 DIAGNOSIS — C50412 Malignant neoplasm of upper-outer quadrant of left female breast: Secondary | ICD-10-CM

## 2019-10-06 DIAGNOSIS — Z483 Aftercare following surgery for neoplasm: Secondary | ICD-10-CM

## 2019-10-06 DIAGNOSIS — R293 Abnormal posture: Secondary | ICD-10-CM

## 2019-10-06 NOTE — Therapy (Addendum)
[place], [place], [address] Phone: [phone]   Fax:  [phone]  Physical Therapy Treatment  Patient Details  Name: [name] MRN: [id number] Date of Birth: 09/12/1965 Referring Provider (PT): Dr. Rolm Bookbinder   Encounter Date: 10/06/2019   PT End of Session - 10/06/19 1102    Visit Number 5    Number of Visits 6    Date for PT Re-Evaluation 10/07/19    PT Start Time 1007    PT Stop Time 1053    PT Time Calculation (min) 46 min    Activity Tolerance Patient tolerated treatment well    Behavior During Therapy [facility] for tasks assessed/performed           Past Medical History:  Diagnosis Date  . Arthritis   . Cancer [facility])    Left breast  . Headache    migraine     Past Surgical History:  Procedure Laterality Date  . ARTERY BIOPSY Right 11/11/2018   Procedure: BIOPSY TEMPORAL ARTERY;  Surgeon: Rosetta Posner, MD;  Location: [place];  Service: Vascular;  Laterality: Right;  . BREAST LUMPECTOMY WITH RADIOACTIVE SEED AND SENTINEL LYMPH NODE BIOPSY Left 08/05/2019   Procedure: LEFT BREAST LUMPECTOMY WITH RADIOACTIVE SEED AND LEFT AXILLARY SENTINEL LYMPH NODE BIOPSY;  Surgeon: Rolm Bookbinder, MD;  Location: [place];  Service: General;  Laterality: Left;  PEC BLOCK  . CESAREAN SECTION     x2  . PORTACATH PLACEMENT Right 08/05/2019   Procedure: INSERTION PORT-A-CATH WITH ULTRASOUND GUIDANCE;  Surgeon: Rolm Bookbinder, MD;  Location: [place];  Service: General;  Laterality: Right;    There were no vitals filed for this visit.   Subjective Assessment - 10/06/19 1008    Subjective I'm doing well. Still feel some tightness with higher reaching, but other than that I feel good. I'm ready to make today my last visit.    Pertinent History Patient was diagnosed on 07/12/2019 with left grade II invasive ductal carcinoma breast cancer.  It is ER/PR negative and HER2 positive with a Ki67 of 90%.  Patient underwent a left lumpectomy and sentinel node biopsy (1 negative node) on 08/05/2019.    Patient Stated Goals Get my arm moving better    Currently in Pain? No/denies              [facility] PT Assessment - 10/06/19 0001      AROM   Left Shoulder Flexion 144 Degrees    Left Shoulder ABduction 159 Degrees    Left Shoulder Internal Rotation 71 Degrees    Left Shoulder External Rotation 90 Degrees                 Quick Dash - 10/06/19 0001    Open a tight or new jar No difficulty    Do heavy household chores (wash walls, wash floors) No difficulty    Carry a shopping bag or briefcase No difficulty    Wash your back No difficulty    Use a knife to cut food No difficulty    Recreational activities in which you take some force or impact through your arm, shoulder, or hand (golf, hammering, tennis) Mild difficulty    During the past week, to what extent has your arm, shoulder or hand problem interfered with your normal social activities with family, friends, neighbors, or groups? Not at all    During the past week, to what extent has your arm, shoulder or hand problem  limited your work or other regular daily activities Not at all    Arm, shoulder, or hand pain. Mild    Tingling (pins and needles) in your arm, shoulder, or hand None    Difficulty Sleeping No difficulty    DASH Score 4.55 %                  [facility] Adult PT Treatment/Exercise - 10/06/19 0001      Shoulder Exercises: Standing   Other Standing Exercises Bil UE 3 way raises with back against wall for flexion, abduction and scaption to shoulder height x10 each with 1# returning therapist demo      Manual Therapy   Manual Therapy Myofascial release;Passive ROM    Myofascial Release To Lt axilla during P/ROM    Passive ROM In Supine to Lt shoulder into flexion, abduction and D2 to pts to[place]                  PT Education - 10/06/19 1013    Education Details Bil UE 3 way raises with 1 lb     Person(s) Educated Patient    Methods Explanation;Demonstration;Handout    Comprehension Verbalized understanding;Returned demonstration               PT Long Term Goals - 10/06/19 1023      PT LONG TERM GOAL #1   Title Patient will demonstrate she has regained full shoulder ROM and function post operatively compared to baselines.    Status Achieved      PT LONG TERM GOAL #2   Title Patient will increase left shoulder active flexion to >/= 140 degrees to increased ease reaching.    Baseline 130 post op; 140 baseline; 144 degrees - 10/06/19    Status Achieved      PT LONG TERM GOAL #3   Title Patient will increase left shoulder active abduction to >/= 150 degrees to increased ease reaching.    Baseline 125 post op; 157 baseline; 159 abduction - 10/06/19    Status Achieved      PT LONG TERM GOAL #4   Title Patient will decrease DASH score to </= 8 for improved overall upper extremity function.    Baseline 4.55 baseline; 20.45 post op; 4.55 - 10/06/19    Status Achieved      PT LONG TERM GOAL #5   Title Patient will verbalize good understanding of risk reduction practices for lymphedema.    Baseline Pt reports feeling well educated with this now - 10/06/19    Status Achieved                 Plan - 10/06/19 1105    Clinical Impression Statement Progressed HEP to include bil UE 3 way raises which pt tolerated well and instructed her in "low and slow" progression of weights or resistance. She has met all goals and is ready for D/C at this time.    Stability/Clinical Decision Making Stable/Uncomplicated    Rehab Potential Excellent    PT Frequency 1x / week    PT Treatment/Interventions ADLs/Self Care Home Management;Therapeutic exercise;Patient/family education    PT Next Visit Plan D/C this visit.    PT Home Exercise Plan Closed chain shoulder flexion and abduction; end ROM stretching in doorway; supine scapular series; bil UE 3 way raises    Consulted and Agree with Plan  of Care Patient           Patient will benefit from skilled therapeutic intervention  in order to improve the following deficits and impairments:  Postural dysfunction, Decreased range of motion, Pain, Impaired UE functional use, Decreased knowledge of precautions  Visit Diagnosis: Malignant neoplasm of upper-outer quadrant of left breast in female, estrogen receptor negative ([place])  Abnormal posture  Aftercare following surgery for neoplasm  Stiffness of left shoulder, not elsewhere classified     Problem List Patient Active Problem List   Diagnosis Date Noted  . Malignant neoplasm of upper-outer quadrant of left breast in female, estrogen receptor negative ([place]) 07/27/2019  . Neuroretinitis of both eyes 11/14/2018  . Retro-orbital pain   . Papilledema     Otelia Limes, PTA 10/06/2019, 11:29 AM  [place], [place], [address] Phone: [phone]   Fax:  [phone]  Name: PANDA CROSSIN MRN: [id number] Date of Birth: 03-17-1966  PHYSICAL THERAPY DISCHARGE SUMMARY  Visits from Start of Care: 5  Current functional level related to goals / functional outcomes: Goals met. See above for objective findings.   Remaining deficits: None   Education / Equipment: HEP; lymphedema risk reduction education. Plan: Patient agrees to discharge.  Patient goals were met. Patient is being discharged due to meeting the stated rehab goals.  ?????         Annia Friendly, [place] 10/06/19 1:32 PM

## 2019-10-06 NOTE — Telephone Encounter (Signed)
[id number] Confirmed with patient tomorrow's appointment and patient is aware that I will be completing her 4 week study assessments. Patient gave verbal confirmation and denies any questions at this time. Patient thanked and encouraged to call in the meantime with questions.  Maxwell Marion, RN, BSN, [facility] Clinical Research 10/06/2019 3:01 PM

## 2019-10-06 NOTE — Patient Instructions (Signed)

## 2019-10-07 ENCOUNTER — Other Ambulatory Visit: Payer: Self-pay

## 2019-10-07 ENCOUNTER — Encounter: Payer: Self-pay | Admitting: Medical Oncology

## 2019-10-07 ENCOUNTER — Inpatient Hospital Stay: Payer: BC Managed Care – PPO

## 2019-10-07 ENCOUNTER — Encounter: Payer: Self-pay | Admitting: Adult Health

## 2019-10-07 ENCOUNTER — Inpatient Hospital Stay: Payer: BC Managed Care – PPO | Admitting: Adult Health

## 2019-10-07 VITALS — BP 125/77 | HR 92 | Temp 98.9°F | Resp 18 | Ht 60.0 in | Wt 153.8 lb

## 2019-10-07 DIAGNOSIS — C50412 Malignant neoplasm of upper-outer quadrant of left female breast: Secondary | ICD-10-CM | POA: Diagnosis not present

## 2019-10-07 DIAGNOSIS — Z5112 Encounter for antineoplastic immunotherapy: Secondary | ICD-10-CM | POA: Diagnosis not present

## 2019-10-07 DIAGNOSIS — Z006 Encounter for examination for normal comparison and control in clinical research program: Secondary | ICD-10-CM | POA: Diagnosis not present

## 2019-10-07 DIAGNOSIS — Z171 Estrogen receptor negative status [ER-]: Secondary | ICD-10-CM | POA: Diagnosis not present

## 2019-10-07 DIAGNOSIS — Z95828 Presence of other vascular implants and grafts: Secondary | ICD-10-CM | POA: Insufficient documentation

## 2019-10-07 LAB — CMP (CANCER CENTER ONLY)
ALT: 21 U/L (ref 0–44)
AST: 17 U/L (ref 15–41)
Albumin: 3.6 g/dL (ref 3.5–5.0)
Alkaline Phosphatase: 98 U/L (ref 38–126)
Anion gap: 11 (ref 5–15)
BUN: 16 mg/dL (ref 6–20)
CO2: 23 mmol/L (ref 22–32)
Calcium: 8.6 mg/dL — ABNORMAL LOW (ref 8.9–10.3)
Chloride: 107 mmol/L (ref 98–111)
Creatinine: 0.82 mg/dL (ref 0.44–1.00)
GFR, Est AFR Am: >60 mL/min (ref >60)
GFR, Estimated: >60 mL/min (ref >60)
Glucose, Bld: 101 mg/dL — ABNORMAL HIGH (ref 70–99)
Potassium: 4.1 mmol/L (ref 3.5–5.1)
Sodium: 141 mmol/L (ref 135–145)
Total Bilirubin: 0.3 mg/dL (ref 0.3–1.2)
Total Protein: 6.6 g/dL (ref 6.5–8.1)

## 2019-10-07 LAB — CBC WITH DIFFERENTIAL (CANCER CENTER ONLY)
Abs Immature Granulocytes: 0.04 10*3/uL (ref 0.00–0.07)
Basophils Absolute: 0 10*3/uL (ref 0.0–0.1)
Basophils Relative: 1 %
Eosinophils Absolute: 0.1 10*3/uL (ref 0.0–0.5)
Eosinophils Relative: 3 %
HCT: 38.7 % (ref 36.0–46.0)
Hemoglobin: 13 g/dL (ref 12.0–15.0)
Immature Granulocytes: 1 %
Lymphocytes Relative: 37 %
Lymphs Abs: 1.6 10*3/uL (ref 0.7–4.0)
MCH: 31 pg (ref 26.0–34.0)
MCHC: 33.6 g/dL (ref 30.0–36.0)
MCV: 92.1 fL (ref 80.0–100.0)
Monocytes Absolute: 0.4 10*3/uL (ref 0.1–1.0)
Monocytes Relative: 8 %
Neutro Abs: 2.2 10*3/uL (ref 1.7–7.7)
Neutrophils Relative %: 50 %
Platelet Count: 271 10*3/uL (ref 150–400)
RBC: 4.2 MIL/uL (ref 3.87–5.11)
RDW: 13.7 % (ref 11.5–15.5)
WBC Count: 4.4 10*3/uL (ref 4.0–10.5)
nRBC: 0 % (ref 0.0–0.2)

## 2019-10-07 MED ORDER — SODIUM CHLORIDE 0.9 % IV SOLN
10.0000 mg | Freq: Once | INTRAVENOUS | Status: AC
  Administered 2019-10-07: 10 mg via INTRAVENOUS
  Filled 2019-10-07: qty 10

## 2019-10-07 MED ORDER — SODIUM CHLORIDE 0.9 % IV SOLN
Freq: Once | INTRAVENOUS | Status: AC
  Filled 2019-10-07: qty 250

## 2019-10-07 MED ORDER — ACETAMINOPHEN 325 MG PO TABS
650.0000 mg | ORAL_TABLET | Freq: Once | ORAL | Status: AC
  Administered 2019-10-07: 650 mg via ORAL

## 2019-10-07 MED ORDER — FAMOTIDINE IN NACL 20-0.9 MG/50ML-% IV SOLN
20.0000 mg | Freq: Once | INTRAVENOUS | Status: AC
  Administered 2019-10-07: 20 mg via INTRAVENOUS

## 2019-10-07 MED ORDER — FAMOTIDINE IN NACL 20-0.9 MG/50ML-% IV SOLN
INTRAVENOUS | Status: AC
  Filled 2019-10-07: qty 50

## 2019-10-07 MED ORDER — TRASTUZUMAB-DKST CHEMO 150 MG IV SOLR
150.0000 mg | Freq: Once | INTRAVENOUS | Status: AC
  Administered 2019-10-07: 150 mg via INTRAVENOUS
  Filled 2019-10-07: qty 7.1

## 2019-10-07 MED ORDER — SODIUM CHLORIDE 0.9 % IV SOLN
80.0000 mg/m2 | Freq: Once | INTRAVENOUS | Status: AC
  Administered 2019-10-07: 138 mg via INTRAVENOUS
  Filled 2019-10-07: qty 23

## 2019-10-07 MED ORDER — DIPHENHYDRAMINE HCL 50 MG/ML IJ SOLN
25.0000 mg | Freq: Once | INTRAMUSCULAR | Status: AC
  Administered 2019-10-07: 25 mg via INTRAVENOUS

## 2019-10-07 MED ORDER — HEPARIN SOD (PORK) LOCK FLUSH 100 UNIT/ML IV SOLN
500.0000 [IU] | Freq: Once | INTRAVENOUS | Status: AC | PRN
  Administered 2019-10-07: 500 [IU]
  Filled 2019-10-07: qty 5

## 2019-10-07 MED ORDER — DIPHENHYDRAMINE HCL 50 MG/ML IJ SOLN
INTRAMUSCULAR | Status: AC
  Filled 2019-10-07: qty 1

## 2019-10-07 MED ORDER — ACETAMINOPHEN 325 MG PO TABS
ORAL_TABLET | ORAL | Status: AC
  Filled 2019-10-07: qty 2

## 2019-10-07 MED ORDER — SODIUM CHLORIDE 0.9% FLUSH
10.0000 mL | INTRAVENOUS | Status: DC | PRN
  Administered 2019-10-07: 10 mL
  Filled 2019-10-07: qty 10

## 2019-10-07 MED ORDER — SODIUM CHLORIDE 0.9% FLUSH
10.0000 mL | Freq: Once | INTRAVENOUS | Status: AC
  Administered 2019-10-07: 10 mL
  Filled 2019-10-07: qty 10

## 2019-10-07 NOTE — Assessment & Plan Note (Signed)
07/27/2019:Screening mammogram showed a left breast asymmetry. Diagnostic mammogram and US showed a 1.2cm left breast mass, 12:30 position, no left axillary adenopathy. 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 T1c N0 stage Ia clinical stage  Recommendation: 1.08/05/2019:Left lumpectomy: Grade 3 IDC, 2 cm with high-grade DCIS, lymphovascular invasion was identified, 1 lymph node negative, margins negative, ER 0%, PR 0%, HER-2 positive, Ki-67 90% 2.Adjuvant chemotherapy with Taxol Herceptin followed by Herceptin maintenance for 1 yearstarted 08/30/2019 3.Adjuvant radiation therapy Patient is participating inSWOG S 1714neuropathy clinical trial --------------------------------------------------------------------------------------------------------------------------------------------------------------- Current treatment: Cycle 6 Taxol Herceptin Echocardiogram: 08/02/2019: EF 60 to 65%  Chemo toxicities:  [name] is tolerating her treatment well.  No new or progressive peripheral neuropathy related to her chemotherapy.  She will continue on weekly Taxol and Herceptin.  We talked about her hair loss, and the fact that taxol can be unpredictable with when and how much hair you lose.  I anticipate she will continue to lose more in the coming weeks.  [name] is exercising by walking 3 miles a day.  This is wonderful.    We will continue to see her every other treatment.  I have requested the rest of her appointments be scheduled.

## 2019-10-07 NOTE — Progress Notes (Signed)
[id number], A PROSPECTIVE OBSERVATIONAL COHORT STUDY TO DEVELOP A PREDICTIVE MODEL OF TAXANE-INDUCED PERIPHERAL NEUROPATHY IN CANCER PATIENTS. Week 4 visit Patient presented to the clinic, alone today for her treatment. I met with patient after her lab and prior to her office visit with NP, Wilber Bihari.  Patient and I reviewed her medication list, and patient confirms to not be taking anything for neuropathy. Patient denies having any new or concerning issues. Patient with an ongoing history of neuropathy to bilateral hands due to history of carpal tunnel for approximately 8 years now. Patient states numbness and tingling mostly at night, to fingertips, she confirms wearing wrist braces when sleeping. Patient confirms no new or worsening issues and does not feel that she is experiencing neuropathy related to chemotherapy treatments. Patient does report to be using ice to bilateral hands during taxol treatment for preventative purposes.  PROs: Questionnaires were given to patient to complete upon arrival to clinic. Completed questionnaires were collected and checked for completeness and accuracy.  Labs: No labs for study to be collected. Patient did not consent to the optional labs.  Physician Assessments: CTCAE and Treatment Burden forms completed and signed by NP L. Causey and will be reviewed and co-signed by MD upon his return to office.  History of Falls: Not required collection at this time point.  Assessment for Interventions for CIPN: Reviewed with patient and CRFs completed. Neuropen Assessment: Completed per protocol by this certified research RN. [place] Assessment: Completed per protocol by this Film/video editor.  Timed Get Up and Go Test: Not required collection at this time point.  Plan: Patient informed, next study assessments in approximately 4 weeks and we will try to collect on same day as treatment, if possible.  Plan for this next visit to be approximately in mid July. Patient  denied having any questions at this time. Patient was thanked for her time and continued contribution and support of study and she was  encouraged to call clinic for any questions or concerns she may have prior to her next appointment.  Maxwell Marion, RN, BSN, [facility] Clinical Research 10/07/2019 3:22 PM

## 2019-10-07 NOTE — Progress Notes (Signed)
[facility] [facility] Follow up:    Sherri Blackbird, MD [place] [address]   DIAGNOSIS: Cancer Staging Malignant neoplasm of upper-outer quadrant of left breast in female, estrogen receptor negative ([place]) Staging form: Breast, AJCC 8th Edition - Clinical stage from 07/27/2019: Stage IA (cT1c, cN0, cM0, G2, ER-, PR-, HER2+) - Unsigned - Pathologic stage from 08/05/2019: Stage IIA (pT2, pN0, cM0, G3, ER-, PR-, HER2+) - Signed by Gardenia Phlegm, NP on 08/18/2019   SUMMARY OF ONCOLOGIC HISTORY: Oncology History  Malignant neoplasm of upper-outer quadrant of left breast in female, estrogen receptor negative ([place])  07/27/2019 Initial Diagnosis   Screening mammogram showed a left breast asymmetry. Diagnostic mammogram and US showed a 1.2cm left breast mass, 12:30 position, no left axillary adenopathy. 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   08/05/2019 Surgery   Left lumpectomy: Grade 3 IDC, 2 cm with high-grade DCIS, lymphovascular invasion was identified, 1 lymph node negative, margins negative, ER 0%, PR 0%, HER-2 positive, Ki-67 90%   08/05/2019 Cancer Staging   Staging form: Breast, AJCC 8th Edition - Pathologic stage from 08/05/2019: Stage IIA (pT2, pN0, cM0, G3, ER-, PR-, HER2+) - Signed by Gardenia Phlegm, NP on 08/18/2019   08/30/2019 -  Chemotherapy   The patient had PACLitaxel (TAXOL) 138 mg in sodium chloride 0.9 % 250 mL chemo infusion (</= 61m/m2), 80 mg/m2 = 138 mg, Intravenous,  Once, 2 of 3 cycles Administration: 138 mg (08/30/2019), 138 mg (09/06/2019), 138 mg (09/30/2019), 138 mg (09/13/2019), 138 mg (09/22/2019) trastuzumab-dkst (OGIVRI) 273 mg in sodium chloride 0.9 % 250 mL chemo infusion, 4 mg/kg = 273 mg, Intravenous,  Once, 2 of 16 cycles Administration: 273 mg (08/30/2019), 150 mg (09/06/2019), 150 mg (09/30/2019), 150 mg (09/13/2019), 150 mg (09/22/2019)  for chemotherapy treatment.      CURRENT  THERAPY: Taxol Herceptin  INTERVAL HISTORY: Sherri CRAMMER89y.o. female returns for evaluation prior to receiving her sixth cycle of adjuvant Taxol and Herceptin.  She continues to tolerate treatment well.  She has noted some hair falling out, and has also noticed other patients completely without hair.  She wants to know what she might expect regarding her hair int he weeks to come.    She is feeling overall well.  She says that she will have an urgent BM the day following treatment, however this is predictable, and resolves after it occurs.  She has no peripheral neuropathy other than what is related to her bilateral carpal tunnel.    [name] is exercising every day by walking 3 miles.     Patient Active Problem List   Diagnosis Date Noted  . Port-A-Cath in place 10/07/2019  . Malignant neoplasm of upper-outer quadrant of left breast in female, estrogen receptor negative ([place] 07/27/2019  . Neuroretinitis of both eyes 11/14/2018  . Retro-orbital pain   . Papilledema     has No Known Allergies.  MEDICAL HISTORY: Past Medical History:  Diagnosis Date  . Arthritis   . [place]    Left breast  . Headache    migraine     SURGICAL HISTORY: Past Surgical History:  Procedure Laterality Date  . ARTERY BIOPSY Right 11/11/2018   Procedure: BIOPSY TEMPORAL ARTERY;  Surgeon: ERosetta Posner MD;  [place]  Service: Vascular;  Laterality: Right;  . BREAST LUMPECTOMY WITH RADIOACTIVE SEED AND SENTINEL LYMPH NODE BIOPSY Left 08/05/2019   Procedure: LEFT BREAST  LUMPECTOMY WITH RADIOACTIVE SEED AND LEFT AXILLARY SENTINEL LYMPH NODE BIOPSY;  Surgeon: Rolm Bookbinder, MD;  Location: [place];  Service: General;  Laterality: Left;  PEC BLOCK  . CESAREAN SECTION     x2  . PORTACATH PLACEMENT Right 08/05/2019   Procedure: INSERTION PORT-A-CATH WITH ULTRASOUND GUIDANCE;  Surgeon: Rolm Bookbinder, MD;  Location: [place];  Service: General;  Laterality: Right;    SOCIAL HISTORY: Social  History   Socioeconomic History  . Marital status: Single    Spouse name: Not on file  . Number of children: Not on file  . Years of education: Not on file  . Highest education level: Not on file  Occupational History  . Not on file  Tobacco Use  . Smoking status: Never Smoker  . Smokeless tobacco: Never Used  Vaping Use  . Vaping Use: Never used  Substance and Sexual Activity  . Alcohol use: Yes    Alcohol/week: 2.0 standard drinks    Types: 2 Glasses of wine per week  . Drug use: No  . Sexual activity: Yes    Birth control/protection: Post-menopausal  Other Topics Concern  . Not on file  Social History Narrative  . Not on file   Social Determinants of Health   Financial Resource Strain:   . Difficulty of Paying Living Expenses:   Food Insecurity:   . Worried About Charity fundraiser in the Last Year:   . Arboriculturist in the Last Year:   Transportation Needs:   . Film/video editor (Medical):   Marland Kitchen Lack of Transportation (Non-Medical):   Physical Activity:   . Days of Exercise per Week:   . Minutes of Exercise per Session:   Stress:   . Feeling of Stress :   Social Connections:   . Frequency of Communication with Friends and Family:   . Frequency of Social Gatherings with Friends and Family:   . Attends Religious Services:   . Active Member of Clubs or Organizations:   . Attends Archivist Meetings:   Marland Kitchen Marital Status:   Intimate Partner Violence:   . Fear of Current or Ex-Partner:   . Emotionally Abused:   Marland Kitchen Physically Abused:   . Sexually Abused:     FAMILY HISTORY: Family History  Problem Relation Age of Onset  . COPD Mother   . Emphysema Mother   . Cancer Father   . Heart disease Maternal Grandfather     Review of Systems  Constitutional: Negative for appetite change, chills, fatigue and unexpected weight change.  HENT:   Negative for hearing loss and lump/mass.   Eyes: Negative for eye problems and icterus.  Respiratory:  Negative for chest tightness, cough and shortness of breath.   Cardiovascular: Negative for chest pain, leg swelling and palpitations.  Gastrointestinal: Negative for abdominal distention, abdominal pain, constipation, diarrhea, nausea and vomiting.  Endocrine: Negative for hot flashes.  Genitourinary: Negative for difficulty urinating.   Musculoskeletal: Negative for arthralgias.  Skin: Negative for itching and rash.  Neurological: Negative for dizziness, extremity weakness, headaches and numbness.  Hematological: Negative for adenopathy. Does not bruise/bleed easily.  Psychiatric/Behavioral: Negative for depression. The patient is not nervous/anxious.       PHYSICAL EXAMINATION  ECOG PERFORMANCE STATUS: 1 - Symptomatic but completely ambulatory  Vitals:   10/07/19 1308  BP: 125/77  Pulse: 92  Resp: 18  Temp: 98.9 F (37.2 C)  SpO2: 99%    Physical Exam Constitutional:  General: She is not in acute distress.    Appearance: Normal appearance. She is not toxic-appearing.  HENT:     Head: Normocephalic and atraumatic.  Eyes:     General: No scleral icterus. Cardiovascular:     Rate and Rhythm: Normal rate and regular rhythm.     Pulses: Normal pulses.     Heart sounds: Normal heart sounds.  Pulmonary:     Effort: Pulmonary effort is normal.     Breath sounds: Normal breath sounds.  Abdominal:     General: Abdomen is flat. Bowel sounds are normal. There is no distension.     Palpations: Abdomen is soft.     Tenderness: There is no abdominal tenderness.  Musculoskeletal:     Cervical back: Neck supple.  Skin:    General: Skin is warm and dry.     Capillary Refill: Capillary refill takes less than 2 seconds.     Findings: No rash.  Neurological:     General: No focal deficit present.     Mental Status: She is alert.  Psychiatric:        Mood and Affect: Mood normal.        Behavior: Behavior normal.     LABORATORY DATA:  CBC    Component Value Date/Time    WBC 4.4 10/07/2019 1257   WBC 7.8 11/11/2018 0321   RBC 4.20 10/07/2019 1257   HGB 13.0 10/07/2019 1257   HCT 38.7 10/07/2019 1257   PLT 271 10/07/2019 1257   MCV 92.1 10/07/2019 1257   MCH 31.0 10/07/2019 1257   MCHC 33.6 10/07/2019 1257   RDW 13.7 10/07/2019 1257   LYMPHSABS 1.6 10/07/2019 1257   MONOABS 0.4 10/07/2019 1257   EOSABS 0.1 10/07/2019 1257   BASOSABS 0.0 10/07/2019 1257    CMP     Component Value Date/Time   NA 141 10/07/2019 1257   K 4.1 10/07/2019 1257   CL 107 10/07/2019 1257   CO2 23 10/07/2019 1257   GLUCOSE 101 (H) 10/07/2019 1257   BUN 16 10/07/2019 1257   CREATININE 0.82 10/07/2019 1257   CREATININE 0.78 08/30/2015 1044   CALCIUM 8.6 (L) 10/07/2019 1257   PROT 6.6 10/07/2019 1257   ALBUMIN 3.6 10/07/2019 1257   AST 17 10/07/2019 1257   ALT 21 10/07/2019 1257   ALKPHOS 98 10/07/2019 1257   BILITOT 0.3 10/07/2019 1257   GFRNONAA >60 10/07/2019 1257   GFRAA >60 10/07/2019 1257           ASSESSMENT and THERAPY PLAN:   Malignant neoplasm of upper-outer quadrant of left breast in female, estrogen receptor negative ([place]) 07/27/2019:Screening mammogram showed a left breast asymmetry. Diagnostic mammogram and US showed a 1.2cm left breast mass, 12:30 position, no left axillary adenopathy. 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 T1c N0 stage Ia clinical stage  Recommendation: 1.08/05/2019:Left lumpectomy: Grade 3 IDC, 2 cm with high-grade DCIS, lymphovascular invasion was identified, 1 lymph node negative, margins negative, ER 0%, PR 0%, HER-2 positive, Ki-67 90% 2.Adjuvant chemotherapy with Taxol Herceptin followed by Herceptin maintenance for 1 yearstarted 08/30/2019 3.Adjuvant radiation therapy Patient is participating inSWOG S 1714neuropathy clinical  trial --------------------------------------------------------------------------------------------------------------------------------------------------------------- Current treatment: Cycle 6 Taxol Herceptin Echocardiogram: 08/02/2019: EF 60 to 65%  Chemo toxicities:  [name] is tolerating her treatment well.  No new or progressive peripheral neuropathy related to her chemotherapy.  She will continue on weekly Taxol and Herceptin.  We talked about her hair loss, and  the fact that taxol can be unpredictable with when and how much hair you lose.  I anticipate she will continue to lose more in the coming weeks.  [name] is exercising by walking 3 miles a day.  This is wonderful.    We will continue to see her every other treatment.  I have requested the rest of her appointments be scheduled.    Total encounter time: 20 minutes*  Wilber Bihari, NP 10/07/19 1:59 PM Medical Oncology and Hematology [facility] [place], [place] [address] Tel. [phone]    Fax. [phone]  *Total Encounter Time as defined by the Centers for Medicare and Medicaid Services includes, in addition to the face-to-face time of a patient visit (documented in the note above) non-face-to-face time: obtaining and reviewing outside history, ordering and reviewing medications, tests or procedures, care coordination (communications with other health care professionals or caregivers) and documentation in the medical record.

## 2019-10-07 NOTE — Patient Instructions (Signed)
[facility] Discharge Instructions for Patients Receiving Chemotherapy  Today you received the following chemotherapy agents Trastuzumab and Taxol  To help prevent nausea and vomiting after your treatment, we encourage you to take your nausea medication as directed.    If you develop nausea and vomiting that is not controlled by your nausea medication, call the clinic.   BELOW ARE SYMPTOMS THAT SHOULD BE REPORTED IMMEDIATELY:  *FEVER GREATER THAN 100.5 F  *CHILLS WITH OR WITHOUT FEVER  NAUSEA AND VOMITING THAT IS NOT CONTROLLED WITH YOUR NAUSEA MEDICATION  *UNUSUAL SHORTNESS OF BREATH  *UNUSUAL BRUISING OR BLEEDING  TENDERNESS IN MOUTH AND THROAT WITH OR WITHOUT PRESENCE OF ULCERS  *URINARY PROBLEMS  *BOWEL PROBLEMS  UNUSUAL RASH Items with * indicate a potential emergency and should be followed up as soon as possible.  Feel free to call the clinic should you have any questions or concerns. The clinic phone number is [phone].  Please show the CHEMO ALERT CARD at check-in to the Emergency Department and triage nurse.   

## 2019-10-08 ENCOUNTER — Telehealth: Payer: Self-pay | Admitting: Adult Health

## 2019-10-08 NOTE — Telephone Encounter (Signed)
Scheduled appts per 6/17 los. Pt confirmed appt dates and times.

## 2019-10-12 ENCOUNTER — Inpatient Hospital Stay (HOSPITAL_BASED_OUTPATIENT_CLINIC_OR_DEPARTMENT_OTHER): Payer: BC Managed Care – PPO | Admitting: Medical

## 2019-10-12 ENCOUNTER — Inpatient Hospital Stay: Payer: BC Managed Care – PPO

## 2019-10-12 ENCOUNTER — Telehealth: Payer: Self-pay | Admitting: *Deleted

## 2019-10-12 ENCOUNTER — Other Ambulatory Visit: Payer: Self-pay

## 2019-10-12 ENCOUNTER — Other Ambulatory Visit: Payer: Self-pay | Admitting: *Deleted

## 2019-10-12 VITALS — BP 131/79 | HR 95 | Temp 97.3°F | Resp 16 | Ht 60.0 in | Wt 155.1 lb

## 2019-10-12 DIAGNOSIS — C50412 Malignant neoplasm of upper-outer quadrant of left female breast: Secondary | ICD-10-CM | POA: Diagnosis not present

## 2019-10-12 DIAGNOSIS — J029 Acute pharyngitis, unspecified: Secondary | ICD-10-CM

## 2019-10-12 DIAGNOSIS — Z171 Estrogen receptor negative status [ER-]: Secondary | ICD-10-CM

## 2019-10-12 DIAGNOSIS — Z5112 Encounter for antineoplastic immunotherapy: Secondary | ICD-10-CM | POA: Diagnosis not present

## 2019-10-12 LAB — CBC WITH DIFFERENTIAL (CANCER CENTER ONLY)
Abs Immature Granulocytes: 0.02 10*3/uL (ref 0.00–0.07)
Basophils Absolute: 0 10*3/uL (ref 0.0–0.1)
Basophils Relative: 0 %
Eosinophils Absolute: 0.1 10*3/uL (ref 0.0–0.5)
Eosinophils Relative: 2 %
HCT: 39.2 % (ref 36.0–46.0)
Hemoglobin: 13.3 g/dL (ref 12.0–15.0)
Immature Granulocytes: 0 %
Lymphocytes Relative: 36 %
Lymphs Abs: 1.7 10*3/uL (ref 0.7–4.0)
MCH: 32 pg (ref 26.0–34.0)
MCHC: 33.9 g/dL (ref 30.0–36.0)
MCV: 94.2 fL (ref 80.0–100.0)
Monocytes Absolute: 0.2 10*3/uL (ref 0.1–1.0)
Monocytes Relative: 4 %
Neutro Abs: 2.8 10*3/uL (ref 1.7–7.7)
Neutrophils Relative %: 58 %
Platelet Count: 273 10*3/uL (ref 150–400)
RBC: 4.16 MIL/uL (ref 3.87–5.11)
RDW: 13.9 % (ref 11.5–15.5)
WBC Count: 4.8 10*3/uL (ref 4.0–10.5)
nRBC: 0 % (ref 0.0–0.2)

## 2019-10-12 LAB — CMP (CANCER CENTER ONLY)
ALT: 24 U/L (ref 0–44)
AST: 16 U/L (ref 15–41)
Albumin: 3.6 g/dL (ref 3.5–5.0)
Alkaline Phosphatase: 93 U/L (ref 38–126)
Anion gap: 9 (ref 5–15)
BUN: 16 mg/dL (ref 6–20)
CO2: 22 mmol/L (ref 22–32)
Calcium: 8.4 mg/dL — ABNORMAL LOW (ref 8.9–10.3)
Chloride: 109 mmol/L (ref 98–111)
Creatinine: 0.92 mg/dL (ref 0.44–1.00)
GFR, Est AFR Am: >60 mL/min
GFR, Estimated: >60 mL/min
Glucose, Bld: 120 mg/dL — ABNORMAL HIGH (ref 70–99)
Potassium: 4 mmol/L (ref 3.5–5.1)
Sodium: 140 mmol/L (ref 135–145)
Total Bilirubin: 0.4 mg/dL (ref 0.3–1.2)
Total Protein: 6.6 g/dL (ref 6.5–8.1)

## 2019-10-12 MED ORDER — MAGIC MOUTHWASH: 5.0000 mL | Freq: Four times a day (QID) | ORAL | 0 refills | Status: DC | PRN

## 2019-10-12 MED ORDER — AZITHROMYCIN 250 MG PO TABS: ORAL_TABLET | ORAL | 0 refills | Status: DC

## 2019-10-12 NOTE — Telephone Encounter (Signed)
Received call from patient stating she has been having a sore throat since last Friday 6/18.  She received cycle 6 Taxol/Herceptin on 6/17 and woke up Friday morning with sore throat. Temp has been 99. She thought it would go away but the pain was so bad last night she could not sleep. Denies any mouth sores.  [name] will see her today to assess.

## 2019-10-12 NOTE — Progress Notes (Signed)
Symptoms Management Clinic Progress Note   [name] [id number] 05-08-1965 54 y.o.  [name] is managed by Dr. Lindi Adie.  Actively treated with chemotherapy/immunotherapy/hormonal therapy: yes  Current therapy: Adjuvant chemotherapy with taxol herceptin followed by Herceptin maintenance for 1 year started 08/30/2019; adjuvant radiation therapy and participating in SWOG S 1714 neuropathy clinical trial.   Last treated: 06 / 17 / 2021 (cycle 2, day 8)  Next scheduled appointment with provider: 07 / 01 / 2021  Assessment: Plan:    Sore throat - Plan: azithromycin (ZITHROMAX Z-PAK) 250 MG tablet, magic mouthwash SOLN  Malignant neoplasm of upper-outer quadrant of left breast in female, estrogen receptor negative ([place])   Sore throat: Since the patient is actively receiving chemo treatments, she was prescribed azithromycin for 6 days. She was also prescribed magic mouthwash prn (benadryl, nystatin, mylanta 1:1:1) for sore throat relief.  ER negative malignant neoplasm of the left breast: The patient continues to be managed by Dr. Lindi Adie and is status post cycle 2, day 8 of adjuvant paclitaxel.  Please see After Visit Summary for patient specific instructions.  Future Appointments  Date Time Provider [place]  10/15/2019  8:30 AM [facility] LAB 2 [facility] None  10/15/2019  8:45 AM [facility] [place] FLUSH [facility] None  10/15/2019  9:30 AM [facility] INFUSION [facility] None  10/21/2019  8:45 AM [facility] LAB 2 [facility] None  10/21/2019  9:00 AM [facility] [place] FLUSH [facility] None  10/21/2019  9:30 AM [facility], Charlestine Massed, NP [facility] None  10/21/2019 10:30 AM [facility] INFUSION [facility] None  10/28/2019 11:45 AM [facility] LAB 2 [facility] None  10/28/2019 12:00 PM [facility] [place] FLUSH [facility] None  10/28/2019  1:00 PM [facility] INFUSION [facility] None  11/01/2019  3:15 PM Collie Siad A, PTA [facility] None  11/04/2019  9:30 AM [facility] LAB 4  [facility] None  11/04/2019  9:45 AM [facility] [place] FLUSH [facility] None  11/04/2019 10:15 AM Nicholas Lose, MD [facility] None  11/04/2019 11:00 AM [facility] INFUSION [facility] None  11/11/2019 11:00 AM [facility] LAB 6 [facility] None  11/11/2019 11:15 AM [facility] FLUSH [facility] None  11/11/2019 12:15 PM [facility] INFUSION [facility] None  11/18/2019  9:45 AM [facility] LAB 4 [facility] None  11/18/2019 10:00 AM [facility] [place] FLUSH [facility] None  11/18/2019 10:30 AM Nicholas Lose, MD [facility] None  11/18/2019 11:15 AM [facility] INFUSION [facility] None    No orders of the defined types were placed in this encounter.      Subjective:   Patient ID:  [name] is a 54 y.o. (DOB 13-Oct-1965) female.  Chief Complaint: No chief complaint on file.   HPI [name] is a 54 year old female patient with a diagnosis of malignant neoplasm of upper-outer quadrant of the left breast, estrogen receptor negative. She is currently being treated with adjuvant chemotherapy with taxol herceptin followed by Herceptin maintenance for 1 year started 08/30/2019, in addition to adjuvant radiation therapy (SWOG S 1714 neuropathy clinical trial). She presents to the symptom management clinic today with complaints of a persistent sore throat since last Friday. She denies any sick contacts and states this sore throat has continued to worsen since last week, making it difficult to sleep at night. She states she has had mild relief from ibuprofen. She also endorses a little bilateral ear tenderness. She denies fevers, chills, congestion, chest pain, shortness of breath, abdominal pain.   Medications: I have reviewed the patient's current medications.  Allergies: No Known Allergies  Past Medical History:  Diagnosis Date  . Arthritis   . Cancer [facility])    Left breast  . Headache    migraine     Past Surgical History:  Procedure Laterality Date  . ARTERY BIOPSY Right 11/11/2018    Procedure: BIOPSY TEMPORAL ARTERY;  Surgeon: Rosetta Posner, MD;  Location: [place];  Service: Vascular;  Laterality: Right;  . BREAST LUMPECTOMY WITH RADIOACTIVE SEED AND SENTINEL LYMPH NODE BIOPSY Left 08/05/2019   Procedure: LEFT BREAST LUMPECTOMY WITH RADIOACTIVE SEED AND LEFT AXILLARY SENTINEL LYMPH NODE BIOPSY;  Surgeon: Rolm Bookbinder, MD;  Location: [place];  Service: General;  Laterality: Left;  PEC BLOCK  . CESAREAN SECTION     x2  . PORTACATH PLACEMENT Right 08/05/2019   Procedure: INSERTION PORT-A-CATH WITH ULTRASOUND GUIDANCE;  Surgeon: Rolm Bookbinder, MD;  Location: [place];  Service: General;  Laterality: Right;    Family History  Problem Relation Age of Onset  . COPD Mother   . Emphysema Mother   . Cancer Father   . Heart disease Maternal Grandfather     Social History   Socioeconomic History  . Marital status: Single    Spouse name: Not on file  . Number of children: Not on file  . Years of education: Not on file  . Highest education level: Not on file  Occupational History  . Not on file  Tobacco Use  . Smoking status: Never Smoker  . Smokeless tobacco: Never Used  Vaping Use  . Vaping Use: Never used  Substance and Sexual Activity  . Alcohol use: Yes    Alcohol/week: 2.0 standard drinks    Types: 2 Glasses of wine per week  . Drug use: No  . Sexual activity: Yes    Birth control/protection: Post-menopausal  Other Topics Concern  . Not on file  Social History Narrative  . Not on file   Social Determinants of Health   Financial Resource Strain:   . Difficulty of Paying Living Expenses:   Food Insecurity:   . Worried About Charity fundraiser in the Last Year:   . Arboriculturist in the Last Year:   Transportation Needs:   . Film/video editor (Medical):   Marland Kitchen Lack of Transportation (Non-Medical):   Physical Activity:   . Days of Exercise per Week:   . Minutes of Exercise per Session:   Stress:   . Feeling of Stress :   Social Connections:     . Frequency of Communication with Friends and Family:   . Frequency of Social Gatherings with Friends and Family:   . Attends Religious Services:   . Active Member of Clubs or Organizations:   . Attends Archivist Meetings:   Marland Kitchen Marital Status:   Intimate Partner Violence:   . Fear of Current or Ex-Partner:   . Emotionally Abused:   Marland Kitchen Physically Abused:   . Sexually Abused:     Past Medical History, Surgical history, Social history, and Family history were reviewed and updated as appropriate.   Please see review of systems for further details on the patient's review from today.   Review of Systems:  Review of Systems  Constitutional: Negative for appetite change, chills, fatigue and fever.  HENT: Positive for ear pain and sore throat. Negative for congestion, ear discharge, postnasal drip, sinus pressure, sinus pain and tinnitus.   Eyes: Negative for discharge and itching.  Respiratory: Negative for cough, chest tightness and shortness of breath.   Cardiovascular: Negative for chest  pain, palpitations and leg swelling.  Gastrointestinal: Negative for abdominal distention, abdominal pain, constipation, diarrhea, nausea and vomiting.  Neurological: Negative for dizziness and headaches.    Objective:   Physical Exam:  BP 131/79 (BP Location: Left Arm, Patient Position: Sitting)   Pulse 95   Temp (!) 97.3 F (36.3 C) (Temporal)   Resp 16   Ht 5' (1.524 m)   Wt 155 lb 1.6 oz (70.4 kg)   LMP 05/01/2012 (LMP Unknown)   SpO2 99%   BMI 30.29 kg/m  ECOG: 0  Physical Exam Constitutional:      General: She is not in acute distress.    Appearance: Normal appearance. She is not ill-appearing.  HENT:     Head: Normocephalic and atraumatic.     Right Ear: Tympanic membrane, ear canal and external ear normal.     Left Ear: Tympanic membrane, ear canal and external ear normal.     Mouth/Throat:     Mouth: Mucous membranes are moist.     Pharynx: Posterior oropharyngeal  erythema present. No oropharyngeal exudate.  Cardiovascular:     Rate and Rhythm: Normal rate and regular rhythm.     Pulses: Normal pulses.     Heart sounds: Normal heart sounds. No murmur heard.  No friction rub. No gallop.   Pulmonary:     Effort: Pulmonary effort is normal.     Breath sounds: Normal breath sounds.  Musculoskeletal:     Cervical back: No tenderness.  Lymphadenopathy:     Cervical: No cervical adenopathy.  Skin:    General: Skin is warm and dry.  Neurological:     Mental Status: She is alert.     Lab Review:     Component Value Date/Time   NA 140 10/12/2019 1337   K 4.0 10/12/2019 1337   CL 109 10/12/2019 1337   CO2 22 10/12/2019 1337   GLUCOSE 120 (H) 10/12/2019 1337   BUN 16 10/12/2019 1337   CREATININE 0.92 10/12/2019 1337   CREATININE 0.78 08/30/2015 1044   CALCIUM 8.4 (L) 10/12/2019 1337   PROT 6.6 10/12/2019 1337   ALBUMIN 3.6 10/12/2019 1337   AST 16 10/12/2019 1337   ALT 24 10/12/2019 1337   ALKPHOS 93 10/12/2019 1337   BILITOT 0.4 10/12/2019 1337   GFRNONAA >60 10/12/2019 1337   GFRAA >60 10/12/2019 1337       Component Value Date/Time   WBC 4.8 10/12/2019 1337   WBC 7.8 11/11/2018 0321   RBC 4.16 10/12/2019 1337   HGB 13.3 10/12/2019 1337   HCT 39.2 10/12/2019 1337   PLT 273 10/12/2019 1337   MCV 94.2 10/12/2019 1337   MCH 32.0 10/12/2019 1337   MCHC 33.9 10/12/2019 1337   RDW 13.9 10/12/2019 1337   LYMPHSABS 1.7 10/12/2019 1337   MONOABS 0.2 10/12/2019 1337   EOSABS 0.1 10/12/2019 1337   BASOSABS 0.0 10/12/2019 1337   -------------------------------  Imaging from last 24 hours (if applicable):  Radiology interpretation: No results found.

## 2019-10-15 ENCOUNTER — Inpatient Hospital Stay: Payer: BC Managed Care – PPO

## 2019-10-15 ENCOUNTER — Other Ambulatory Visit: Payer: Self-pay

## 2019-10-15 VITALS — BP 126/79 | HR 92 | Temp 98.4°F | Resp 18 | Wt 154.5 lb

## 2019-10-15 DIAGNOSIS — Z95828 Presence of other vascular implants and grafts: Secondary | ICD-10-CM

## 2019-10-15 DIAGNOSIS — Z5112 Encounter for antineoplastic immunotherapy: Secondary | ICD-10-CM | POA: Diagnosis not present

## 2019-10-15 DIAGNOSIS — C50412 Malignant neoplasm of upper-outer quadrant of left female breast: Secondary | ICD-10-CM

## 2019-10-15 DIAGNOSIS — Z171 Estrogen receptor negative status [ER-]: Secondary | ICD-10-CM

## 2019-10-15 LAB — CMP (CANCER CENTER ONLY)
ALT: 25 U/L (ref 0–44)
AST: 18 U/L (ref 15–41)
Albumin: 3.6 g/dL (ref 3.5–5.0)
Alkaline Phosphatase: 96 U/L (ref 38–126)
Anion gap: 12 (ref 5–15)
BUN: 20 mg/dL (ref 6–20)
CO2: 21 mmol/L — ABNORMAL LOW (ref 22–32)
Calcium: 8.8 mg/dL — ABNORMAL LOW (ref 8.9–10.3)
Chloride: 107 mmol/L (ref 98–111)
Creatinine: 0.89 mg/dL (ref 0.44–1.00)
GFR, Est AFR Am: >60 mL/min (ref >60)
GFR, Estimated: >60 mL/min (ref >60)
Glucose, Bld: 90 mg/dL (ref 70–99)
Potassium: 4 mmol/L (ref 3.5–5.1)
Sodium: 140 mmol/L (ref 135–145)
Total Bilirubin: 0.3 mg/dL (ref 0.3–1.2)
Total Protein: 6.5 g/dL (ref 6.5–8.1)

## 2019-10-15 LAB — CBC WITH DIFFERENTIAL (CANCER CENTER ONLY)
Abs Immature Granulocytes: 0.07 10*3/uL (ref 0.00–0.07)
Basophils Absolute: 0 10*3/uL (ref 0.0–0.1)
Basophils Relative: 1 %
Eosinophils Absolute: 0.1 10*3/uL (ref 0.0–0.5)
Eosinophils Relative: 2 %
HCT: 40 % (ref 36.0–46.0)
Hemoglobin: 13.5 g/dL (ref 12.0–15.0)
Immature Granulocytes: 2 %
Lymphocytes Relative: 37 %
Lymphs Abs: 1.6 10*3/uL (ref 0.7–4.0)
MCH: 31.8 pg (ref 26.0–34.0)
MCHC: 33.8 g/dL (ref 30.0–36.0)
MCV: 94.3 fL (ref 80.0–100.0)
Monocytes Absolute: 0.4 10*3/uL (ref 0.1–1.0)
Monocytes Relative: 10 %
Neutro Abs: 2.1 10*3/uL (ref 1.7–7.7)
Neutrophils Relative %: 48 %
Platelet Count: 280 10*3/uL (ref 150–400)
RBC: 4.24 MIL/uL (ref 3.87–5.11)
RDW: 14.3 % (ref 11.5–15.5)
WBC Count: 4.3 10*3/uL (ref 4.0–10.5)
nRBC: 0 % (ref 0.0–0.2)

## 2019-10-15 MED ORDER — DIPHENHYDRAMINE HCL 50 MG/ML IJ SOLN
INTRAMUSCULAR | Status: AC
  Filled 2019-10-15: qty 1

## 2019-10-15 MED ORDER — SODIUM CHLORIDE 0.9 % IV SOLN
80.0000 mg/m2 | Freq: Once | INTRAVENOUS | Status: AC
  Administered 2019-10-15: 138 mg via INTRAVENOUS
  Filled 2019-10-15: qty 23

## 2019-10-15 MED ORDER — SODIUM CHLORIDE 0.9% FLUSH
10.0000 mL | INTRAVENOUS | Status: DC | PRN
  Administered 2019-10-15: 10 mL
  Filled 2019-10-15: qty 10

## 2019-10-15 MED ORDER — SODIUM CHLORIDE 0.9 % IV SOLN
Freq: Once | INTRAVENOUS | Status: AC
  Filled 2019-10-15: qty 250

## 2019-10-15 MED ORDER — FAMOTIDINE IN NACL 20-0.9 MG/50ML-% IV SOLN
INTRAVENOUS | Status: AC
  Filled 2019-10-15: qty 50

## 2019-10-15 MED ORDER — ACETAMINOPHEN 325 MG PO TABS
ORAL_TABLET | ORAL | Status: AC
  Filled 2019-10-15: qty 2

## 2019-10-15 MED ORDER — HEPARIN SOD (PORK) LOCK FLUSH 100 UNIT/ML IV SOLN
500.0000 [IU] | Freq: Once | INTRAVENOUS | Status: AC | PRN
  Administered 2019-10-15: 500 [IU]
  Filled 2019-10-15: qty 5

## 2019-10-15 MED ORDER — SODIUM CHLORIDE 0.9 % IV SOLN
10.0000 mg | Freq: Once | INTRAVENOUS | Status: AC
  Administered 2019-10-15: 10 mg via INTRAVENOUS
  Filled 2019-10-15: qty 10

## 2019-10-15 MED ORDER — DIPHENHYDRAMINE HCL 50 MG/ML IJ SOLN
25.0000 mg | Freq: Once | INTRAMUSCULAR | Status: AC
  Administered 2019-10-15: 25 mg via INTRAVENOUS

## 2019-10-15 MED ORDER — FAMOTIDINE IN NACL 20-0.9 MG/50ML-% IV SOLN
20.0000 mg | Freq: Once | INTRAVENOUS | Status: AC
  Administered 2019-10-15: 20 mg via INTRAVENOUS

## 2019-10-15 MED ORDER — SODIUM CHLORIDE 0.9% FLUSH
10.0000 mL | Freq: Once | INTRAVENOUS | Status: AC
  Administered 2019-10-15: 10 mL
  Filled 2019-10-15: qty 10

## 2019-10-15 MED ORDER — TRASTUZUMAB-DKST CHEMO 150 MG IV SOLR
150.0000 mg | Freq: Once | INTRAVENOUS | Status: AC
  Administered 2019-10-15: 150 mg via INTRAVENOUS
  Filled 2019-10-15: qty 7.14

## 2019-10-15 MED ORDER — ACETAMINOPHEN 325 MG PO TABS
650.0000 mg | ORAL_TABLET | Freq: Once | ORAL | Status: AC
  Administered 2019-10-15: 650 mg via ORAL

## 2019-10-15 NOTE — Patient Instructions (Signed)
[facility] Discharge Instructions for Patients Receiving Chemotherapy  Today you received the following chemotherapy agents: trastuzumab and paclitaxel.  To help prevent nausea and vomiting after your treatment, we encourage you to take your nausea medication as directed.   If you develop nausea and vomiting that is not controlled by your nausea medication, call the clinic.   BELOW ARE SYMPTOMS THAT SHOULD BE REPORTED IMMEDIATELY:  *FEVER GREATER THAN 100.5 F  *CHILLS WITH OR WITHOUT FEVER  NAUSEA AND VOMITING THAT IS NOT CONTROLLED WITH YOUR NAUSEA MEDICATION  *UNUSUAL SHORTNESS OF BREATH  *UNUSUAL BRUISING OR BLEEDING  TENDERNESS IN MOUTH AND THROAT WITH OR WITHOUT PRESENCE OF ULCERS  *URINARY PROBLEMS  *BOWEL PROBLEMS  UNUSUAL RASH Items with * indicate a potential emergency and should be followed up as soon as possible.  Feel free to call the clinic should you have any questions or concerns. The clinic phone number is [phone].  Please show the CHEMO ALERT CARD at check-in to the Emergency Department and triage nurse.   

## 2019-10-18 ENCOUNTER — Ambulatory Visit: Payer: BC Managed Care – PPO

## 2019-10-18 ENCOUNTER — Encounter: Payer: Self-pay | Admitting: *Deleted

## 2019-10-18 ENCOUNTER — Other Ambulatory Visit: Payer: BC Managed Care – PPO

## 2019-10-21 ENCOUNTER — Telehealth: Payer: Self-pay | Admitting: Licensed Clinical Social Worker

## 2019-10-21 ENCOUNTER — Inpatient Hospital Stay (HOSPITAL_BASED_OUTPATIENT_CLINIC_OR_DEPARTMENT_OTHER): Payer: BC Managed Care – PPO | Admitting: Adult Health

## 2019-10-21 ENCOUNTER — Inpatient Hospital Stay: Payer: BC Managed Care – PPO | Attending: Hematology and Oncology

## 2019-10-21 ENCOUNTER — Inpatient Hospital Stay: Payer: BC Managed Care – PPO

## 2019-10-21 ENCOUNTER — Encounter: Payer: Self-pay | Admitting: *Deleted

## 2019-10-21 ENCOUNTER — Encounter: Payer: Self-pay | Admitting: Adult Health

## 2019-10-21 ENCOUNTER — Other Ambulatory Visit: Payer: Self-pay

## 2019-10-21 VITALS — BP 136/63 | HR 91 | Temp 98.2°F | Resp 18 | Ht 60.0 in | Wt 155.4 lb

## 2019-10-21 DIAGNOSIS — Z95828 Presence of other vascular implants and grafts: Secondary | ICD-10-CM

## 2019-10-21 DIAGNOSIS — Z171 Estrogen receptor negative status [ER-]: Secondary | ICD-10-CM | POA: Insufficient documentation

## 2019-10-21 DIAGNOSIS — Z006 Encounter for examination for normal comparison and control in clinical research program: Secondary | ICD-10-CM | POA: Diagnosis present

## 2019-10-21 DIAGNOSIS — C50412 Malignant neoplasm of upper-outer quadrant of left female breast: Secondary | ICD-10-CM | POA: Diagnosis present

## 2019-10-21 DIAGNOSIS — Z5112 Encounter for antineoplastic immunotherapy: Secondary | ICD-10-CM | POA: Insufficient documentation

## 2019-10-21 LAB — CMP (CANCER CENTER ONLY)
ALT: 32 U/L (ref 0–44)
AST: 25 U/L (ref 15–41)
Albumin: 3.6 g/dL (ref 3.5–5.0)
Alkaline Phosphatase: 93 U/L (ref 38–126)
Anion gap: 10 (ref 5–15)
BUN: 17 mg/dL (ref 6–20)
CO2: 22 mmol/L (ref 22–32)
Calcium: 8.6 mg/dL — ABNORMAL LOW (ref 8.9–10.3)
Chloride: 109 mmol/L (ref 98–111)
Creatinine: 0.8 mg/dL (ref 0.44–1.00)
GFR, Est AFR Am: >60 mL/min (ref >60)
GFR, Estimated: >60 mL/min (ref >60)
Glucose, Bld: 102 mg/dL — ABNORMAL HIGH (ref 70–99)
Potassium: 4 mmol/L (ref 3.5–5.1)
Sodium: 141 mmol/L (ref 135–145)
Total Bilirubin: 0.4 mg/dL (ref 0.3–1.2)
Total Protein: 6.6 g/dL (ref 6.5–8.1)

## 2019-10-21 LAB — CBC WITH DIFFERENTIAL (CANCER CENTER ONLY)
Abs Immature Granulocytes: 0.03 10*3/uL (ref 0.00–0.07)
Basophils Absolute: 0 10*3/uL (ref 0.0–0.1)
Basophils Relative: 1 %
Eosinophils Absolute: 0.1 10*3/uL (ref 0.0–0.5)
Eosinophils Relative: 2 %
HCT: 38.6 % (ref 36.0–46.0)
Hemoglobin: 13 g/dL (ref 12.0–15.0)
Immature Granulocytes: 1 %
Lymphocytes Relative: 43 %
Lymphs Abs: 1.6 10*3/uL (ref 0.7–4.0)
MCH: 31.3 pg (ref 26.0–34.0)
MCHC: 33.7 g/dL (ref 30.0–36.0)
MCV: 93 fL (ref 80.0–100.0)
Monocytes Absolute: 0.3 10*3/uL (ref 0.1–1.0)
Monocytes Relative: 7 %
Neutro Abs: 1.7 10*3/uL (ref 1.7–7.7)
Neutrophils Relative %: 46 %
Platelet Count: 279 10*3/uL (ref 150–400)
RBC: 4.15 MIL/uL (ref 3.87–5.11)
RDW: 14.4 % (ref 11.5–15.5)
WBC Count: 3.7 10*3/uL — ABNORMAL LOW (ref 4.0–10.5)
nRBC: 0 % (ref 0.0–0.2)

## 2019-10-21 MED ORDER — DIPHENHYDRAMINE HCL 50 MG/ML IJ SOLN
25.0000 mg | Freq: Once | INTRAMUSCULAR | Status: AC
  Administered 2019-10-21: 25 mg via INTRAVENOUS

## 2019-10-21 MED ORDER — SODIUM CHLORIDE 0.9% FLUSH
10.0000 mL | Freq: Once | INTRAVENOUS | Status: AC
  Administered 2019-10-21: 10 mL
  Filled 2019-10-21: qty 10

## 2019-10-21 MED ORDER — ACETAMINOPHEN 325 MG PO TABS
ORAL_TABLET | ORAL | Status: AC
  Filled 2019-10-21: qty 2

## 2019-10-21 MED ORDER — SODIUM CHLORIDE 0.9 % IV SOLN
80.0000 mg/m2 | Freq: Once | INTRAVENOUS | Status: AC
  Administered 2019-10-21: 138 mg via INTRAVENOUS
  Filled 2019-10-21: qty 23

## 2019-10-21 MED ORDER — SODIUM CHLORIDE 0.9 % IV SOLN
Freq: Once | INTRAVENOUS | Status: AC
  Filled 2019-10-21: qty 250

## 2019-10-21 MED ORDER — FAMOTIDINE IN NACL 20-0.9 MG/50ML-% IV SOLN
INTRAVENOUS | Status: AC
  Filled 2019-10-21: qty 50

## 2019-10-21 MED ORDER — ACETAMINOPHEN 325 MG PO TABS
650.0000 mg | ORAL_TABLET | Freq: Once | ORAL | Status: AC
  Administered 2019-10-21: 650 mg via ORAL

## 2019-10-21 MED ORDER — HEPARIN SOD (PORK) LOCK FLUSH 100 UNIT/ML IV SOLN
500.0000 [IU] | Freq: Once | INTRAVENOUS | Status: AC | PRN
  Administered 2019-10-21: 500 [IU]
  Filled 2019-10-21: qty 5

## 2019-10-21 MED ORDER — TRASTUZUMAB-DKST CHEMO 150 MG IV SOLR
150.0000 mg | Freq: Once | INTRAVENOUS | Status: AC
  Administered 2019-10-21: 150 mg via INTRAVENOUS
  Filled 2019-10-21: qty 7.14

## 2019-10-21 MED ORDER — FAMOTIDINE IN NACL 20-0.9 MG/50ML-% IV SOLN
20.0000 mg | Freq: Once | INTRAVENOUS | Status: AC
  Administered 2019-10-21: 20 mg via INTRAVENOUS

## 2019-10-21 MED ORDER — DIPHENHYDRAMINE HCL 50 MG/ML IJ SOLN
INTRAMUSCULAR | Status: AC
  Filled 2019-10-21: qty 1

## 2019-10-21 MED ORDER — SODIUM CHLORIDE 0.9% FLUSH
10.0000 mL | INTRAVENOUS | Status: DC | PRN
  Administered 2019-10-21: 10 mL
  Filled 2019-10-21: qty 10

## 2019-10-21 MED ORDER — SODIUM CHLORIDE 0.9 % IV SOLN
10.0000 mg | Freq: Once | INTRAVENOUS | Status: AC
  Administered 2019-10-21: 10 mg via INTRAVENOUS
  Filled 2019-10-21: qty 10

## 2019-10-21 NOTE — Patient Instructions (Signed)
[facility] Discharge Instructions for Patients Receiving Chemotherapy  Today you received the following chemotherapy agents: trastuzumab and paclitaxel.  To help prevent nausea and vomiting after your treatment, we encourage you to take your nausea medication as directed.   If you develop nausea and vomiting that is not controlled by your nausea medication, call the clinic.   BELOW ARE SYMPTOMS THAT SHOULD BE REPORTED IMMEDIATELY:  *FEVER GREATER THAN 100.5 F  *CHILLS WITH OR WITHOUT FEVER  NAUSEA AND VOMITING THAT IS NOT CONTROLLED WITH YOUR NAUSEA MEDICATION  *UNUSUAL SHORTNESS OF BREATH  *UNUSUAL BRUISING OR BLEEDING  TENDERNESS IN MOUTH AND THROAT WITH OR WITHOUT PRESENCE OF ULCERS  *URINARY PROBLEMS  *BOWEL PROBLEMS  UNUSUAL RASH Items with * indicate a potential emergency and should be followed up as soon as possible.  Feel free to call the clinic should you have any questions or concerns. The clinic phone number is [phone].  Please show the CHEMO ALERT CARD at check-in to the Emergency Department and triage nurse.   

## 2019-10-21 NOTE — Telephone Encounter (Signed)
[place] Work  Clinical Social Work was referred by Wilber Bihari, NP for assessment of needs after patient expressed being more tearful lately because of diagnosis and preparing to return to work.  Clinical Social Worker contacted patient by phone  to offer support and assess for needs.  Discussed options for counseling and support, including visits with this CSW, speaking with our chaplain, and referral out to community therapist. Patient is interested meeting with this CSW in-person prior-to or after other appointments at the [facility]. Scheduled for 10/28/2019.     Hobart Marte, [place], [place] Worker [facility]

## 2019-10-21 NOTE — Assessment & Plan Note (Addendum)
07/27/2019:Screening mammogram showed a left breast asymmetry. Diagnostic mammogram and US showed a 1.2cm left breast mass, 12:30 position, no left axillary adenopathy. 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 T1c N0 stage Ia clinical stage  Recommendation: 1.08/05/2019:Left lumpectomy: Grade 3 IDC, 2 cm with high-grade DCIS, lymphovascular invasion was identified, 1 lymph node negative, margins negative, ER 0%, PR 0%, HER-2 positive, Ki-67 90% 2.Adjuvant chemotherapy with Taxol Herceptin followed by Herceptin maintenance for 1 yearstarted 08/30/2019 3.Adjuvant radiation therapy Patient is participating inSWOG S 1714neuropathy clinical trial --------------------------------------------------------------------------------------------------------------------------------------------------------------- Current treatment: Cycle 8 Taxol Herceptin Echocardiogram: 08/02/2019: EF 60 to 65%  Chemo toxicities:  [name] is tolerating her treatment well.  No new or progressive peripheral neuropathy related to her chemotherapy.  She will continue on weekly Taxol and Herceptin.  She is experiencing an increase in tearfulness and would appreciate assistance in locating a counselor.  We have reached out to our social worker, [name] to assist.    I will place orders for her next echo that will be due in August.   She will continue to walk as she is able. We will see her back weekly for treatment and she will see myself or Dr. Lindi Adie with every other treatment until she completes therapy in a few weeks.

## 2019-10-21 NOTE — Progress Notes (Signed)
[facility] Follow up:    Antony Blackbird, MD [place] [address]   DIAGNOSIS: Cancer Staging Malignant neoplasm of upper-outer quadrant of left breast in female, estrogen receptor negative ([place]) Staging form: Breast, AJCC 8th Edition - Clinical stage from 07/27/2019: Stage IA (cT1c, cN0, cM0, G2, ER-, PR-, HER2+) - Unsigned - Pathologic stage from 08/05/2019: Stage IIA (pT2, pN0, cM0, G3, ER-, PR-, HER2+) - Signed by Gardenia Phlegm, NP on 08/18/2019   SUMMARY OF ONCOLOGIC HISTORY: Oncology History  Malignant neoplasm of upper-outer quadrant of left breast in female, estrogen receptor negative ([place])  07/27/2019 Initial Diagnosis   Screening mammogram showed a left breast asymmetry. Diagnostic mammogram and US showed a 1.2cm left breast mass, 12:30 position, no left axillary adenopathy. 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   08/05/2019 Surgery   Left lumpectomy: Grade 3 IDC, 2 cm with high-grade DCIS, lymphovascular invasion was identified, 1 lymph node negative, margins negative, ER 0%, PR 0%, HER-2 positive, Ki-67 90%   08/05/2019 Cancer Staging   Staging form: Breast, AJCC 8th Edition - Pathologic stage from 08/05/2019: Stage IIA (pT2, pN0, cM0, G3, ER-, PR-, HER2+) - Signed by Gardenia Phlegm, NP on 08/18/2019   08/30/2019 -  Chemotherapy   The patient had PACLitaxel (TAXOL) 138 mg in sodium chloride 0.9 % 250 mL chemo infusion (</= 73m/m2), 80 mg/m2 = 138 mg, Intravenous,  Once, 2 of 3 cycles Administration: 138 mg (08/30/2019), 138 mg (09/06/2019), 138 mg (09/30/2019), 138 mg (09/13/2019), 138 mg (09/22/2019), 138 mg (10/07/2019), 138 mg (10/15/2019) trastuzumab-dkst (OGIVRI) 273 mg in sodium chloride 0.9 % 250 mL chemo infusion, 4 mg/kg = 273 mg, Intravenous,  Once, 2 of 16 cycles Administration: 273 mg (08/30/2019), 150 mg (09/06/2019), 150 mg (09/30/2019), 150 mg (09/13/2019), 150 mg (09/22/2019), 150 mg  (10/07/2019), 150 mg (10/15/2019)  for chemotherapy treatment.      CURRENT THERAPY: taxol herceptin  INTERVAL HISTORY: DGary [id number].[id number]. female returns for evaluation prior to receiving week 8 of her weekly Taxol and herceptin.   She notes that she has been doing physically well.  She has had no peripheral neuropathy.  She has slightly more bowel movements than normal.    She is increasingly tearful and wants to know if there is a counselor that we can connect her with.  She feels like her diagnosis is catching up with her, and she is preparing to go back to work.  She has friends that she talks to about things, however at home, her cancer isn't discussed and this is difficult.     Patient Active Problem List   Diagnosis Date Noted  . Port-A-Cath in place 10/07/2019  . Malignant neoplasm of upper-outer quadrant of left breast in female, estrogen receptor negative ([place] 07/27/2019  . Neuroretinitis of both eyes 11/14/2018  . Retro-orbital pain   . Papilledema     has No Known Allergies.  MEDICAL HISTORY: Past Medical History:  Diagnosis Date  . Arthritis   . [place]    Left breast  . Headache    migraine     SURGICAL HISTORY: Past Surgical History:  Procedure Laterality Date  . ARTERY BIOPSY Right 11/11/2018   Procedure: BIOPSY TEMPORAL ARTERY;  Surgeon: ERosetta Posner MD;  [facility]  Service: Vascular;  Laterality: Right;  . BREAST LUMPECTOMY WITH RADIOACTIVE SEED AND SENTINEL LYMPH NODE BIOPSY Left 08/05/2019   Procedure:  LEFT BREAST LUMPECTOMY WITH RADIOACTIVE SEED AND LEFT AXILLARY SENTINEL LYMPH NODE BIOPSY;  Surgeon: Rolm Bookbinder, MD;  Location: [facility];  Service: General;  Laterality: Left;  PEC BLOCK  . CESAREAN SECTION     x2  . PORTACATH PLACEMENT Right 08/05/2019   Procedure: INSERTION PORT-A-CATH WITH ULTRASOUND GUIDANCE;  Surgeon: Rolm Bookbinder, MD;  Location: [facility];  Service: General;  Laterality: Right;    SOCIAL HISTORY: Social  History   Socioeconomic History  . Marital status: Single    Spouse name: Not on file  . Number of children: Not on file  . Years of education: Not on file  . Highest education level: Not on file  Occupational History  . Not on file  Tobacco Use  . Smoking status: Never Smoker  . Smokeless tobacco: Never Used  Vaping Use  . Vaping Use: Never used  Substance and Sexual Activity  . Alcohol use: Yes    Alcohol/week: 2.0 standard drinks    Types: 2 Glasses of wine per week  . Drug use: No  . Sexual activity: Yes    Birth control/protection: Post-menopausal  Other Topics Concern  . Not on file  Social History Narrative  . Not on file   Social Determinants of Health   Financial Resource Strain:   . Difficulty of Paying Living Expenses:   Food Insecurity:   . Worried About Charity fundraiser in the Last Year:   . Arboriculturist in the Last Year:   Transportation Needs:   . Film/video editor (Medical):   Marland Kitchen Lack of Transportation (Non-Medical):   Physical Activity:   . Days of Exercise per Week:   . Minutes of Exercise per Session:   Stress:   . Feeling of Stress :   Social Connections:   . Frequency of Communication with Friends and Family:   . Frequency of Social Gatherings with Friends and Family:   . Attends Religious Services:   . Active Member of Clubs or Organizations:   . Attends Archivist Meetings:   Marland Kitchen Marital Status:   Intimate Partner Violence:   . Fear of Current or Ex-Partner:   . Emotionally Abused:   Marland Kitchen Physically Abused:   . Sexually Abused:     FAMILY HISTORY: Family History  Problem Relation Age of Onset  . COPD Mother   . Emphysema Mother   . Cancer Father   . Heart disease Maternal Grandfather     Review of Systems  Constitutional: Positive for fatigue. Negative for appetite change, chills, fever and unexpected weight change.  HENT:   Negative for hearing loss.   Eyes: Negative for eye problems and icterus.  Respiratory:  Negative for chest tightness, cough and shortness of breath.   Cardiovascular: Negative for chest pain, leg swelling and palpitations.  Gastrointestinal: Negative for abdominal distention, abdominal pain, blood in stool, constipation, diarrhea, nausea and vomiting.  Endocrine: Negative for hot flashes.  Musculoskeletal: Negative for arthralgias.  Skin: Negative for itching and rash.  Neurological: Negative for dizziness, extremity weakness, headaches and numbness.  Hematological: Negative for adenopathy. Does not bruise/bleed easily.  Psychiatric/Behavioral: Negative for depression. The patient is not nervous/anxious.       PHYSICAL EXAMINATION  ECOG PERFORMANCE STATUS: 1 - Symptomatic but completely ambulatory  Vitals:   10/21/19 0948  BP: 136/63  Pulse: 91  Resp: 18  Temp: 98.2 F (36.8 C)  SpO2: 100%    Physical Exam Constitutional:  General: She is not in acute distress.    Appearance: Normal appearance. She is not toxic-appearing.  HENT:     Head: Normocephalic and atraumatic.  Eyes:     General: No scleral icterus. Cardiovascular:     Rate and Rhythm: Normal rate and regular rhythm.     Pulses: Normal pulses.     Heart sounds: Normal heart sounds.  Pulmonary:     Effort: Pulmonary effort is normal.     Breath sounds: Normal breath sounds.  Abdominal:     General: Abdomen is flat. Bowel sounds are normal.     Palpations: Abdomen is soft.  Musculoskeletal:        General: No swelling.     Cervical back: Neck supple.  Skin:    General: Skin is warm and dry.     Capillary Refill: Capillary refill takes less than 2 seconds.     Findings: No rash.  Neurological:     General: No focal deficit present.     Mental Status: She is alert.  Psychiatric:        Mood and Affect: Mood normal.        Behavior: Behavior normal.     LABORATORY DATA:  CBC    Component Value Date/Time   WBC 3.7 (L) 10/21/2019 0906   WBC 7.8 11/11/2018 0321   RBC 4.15 10/21/2019  0906   HGB 13.0 10/21/2019 0906   HCT 38.6 10/21/2019 0906   PLT 279 10/21/2019 0906   MCV 93.0 10/21/2019 0906   MCH 31.3 10/21/2019 0906   MCHC 33.7 10/21/2019 0906   RDW 14.4 10/21/2019 0906   LYMPHSABS 1.6 10/21/2019 0906   MONOABS 0.3 10/21/2019 0906   EOSABS 0.1 10/21/2019 0906   BASOSABS 0.0 10/21/2019 0906    CMP     Component Value Date/Time   NA 141 10/21/2019 0906   K 4.0 10/21/2019 0906   CL 109 10/21/2019 0906   CO2 22 10/21/2019 0906   GLUCOSE 102 (H) 10/21/2019 0906   BUN 17 10/21/2019 0906   CREATININE 0.80 10/21/2019 0906   CREATININE 0.78 08/30/2015 1044   CALCIUM 8.6 (L) 10/21/2019 0906   PROT 6.6 10/21/2019 0906   ALBUMIN 3.6 10/21/2019 0906   AST 25 10/21/2019 0906   ALT 32 10/21/2019 0906   ALKPHOS 93 10/21/2019 0906   BILITOT 0.4 10/21/2019 0906   GFRNONAA >60 10/21/2019 0906   GFRAA >60 10/21/2019 0906          ASSESSMENT and PLAN:   Malignant neoplasm of upper-outer quadrant of left breast in female, estrogen receptor negative ([place]) 07/27/2019:Screening mammogram showed a left breast asymmetry. Diagnostic mammogram and US showed a 1.2cm left breast mass, 12:30 position, no left axillary adenopathy. 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 T1c N0 stage Ia clinical stage  Recommendation: 1.08/05/2019:Left lumpectomy: Grade 3 IDC, 2 cm with high-grade DCIS, lymphovascular invasion was identified, 1 lymph node negative, margins negative, ER 0%, PR 0%, HER-2 positive, Ki-67 90% 2.Adjuvant chemotherapy with Taxol Herceptin followed by Herceptin maintenance for 1 yearstarted 08/30/2019 3.Adjuvant radiation therapy Patient is participating inSWOG S 1714neuropathy clinical trial --------------------------------------------------------------------------------------------------------------------------------------------------------------- Current treatment: Cycle 8 Taxol Herceptin Echocardiogram: 08/02/2019: EF 60  to 65%  Chemo toxicities:  Brezlyn is tolerating her treatment well.  No new or progressive peripheral neuropathy related to her chemotherapy.  She will continue on weekly Taxol and Herceptin.  She is experiencing an increase in tearfulness and would appreciate assistance in  locating a counselor.  We have reached out to our social worker, Maximino Greenland to assist.    I will place orders for her next echo that will be due in August.   She will continue to walk as she is able. We will see her back weekly for treatment and she will see myself or Dr. Lindi Adie with every other treatment until she completes therapy in a few weeks.     All questions were answered. The patient knows to call the clinic with any problems, questions or concerns. We can certainly see the patient much sooner if necessary.  Total encounter time: 20 minutes*  Wilber Bihari, NP 10/21/19 10:21 AM Medical Oncology and Hematology [facility] [place], [place] [address] Tel. [phone]    Fax. [phone]  *Total Encounter Time as defined by the Centers for Medicare and Medicaid Services includes, in addition to the face-to-face time of a patient visit (documented in the note above) non-face-to-face time: obtaining and reviewing outside history, ordering and reviewing medications, tests or procedures, care coordination (communications with other health care professionals or caregivers) and documentation in the medical record.

## 2019-10-22 ENCOUNTER — Telehealth: Payer: Self-pay | Admitting: Adult Health

## 2019-10-22 NOTE — Telephone Encounter (Signed)
No 7/1 los. No changes made to pt's schedule.

## 2019-10-28 ENCOUNTER — Inpatient Hospital Stay: Payer: BC Managed Care – PPO | Admitting: Licensed Clinical Social Worker

## 2019-10-28 ENCOUNTER — Other Ambulatory Visit: Payer: Self-pay

## 2019-10-28 ENCOUNTER — Inpatient Hospital Stay: Payer: BC Managed Care – PPO

## 2019-10-28 VITALS — BP 132/76 | HR 79 | Temp 98.4°F | Resp 18

## 2019-10-28 DIAGNOSIS — C50412 Malignant neoplasm of upper-outer quadrant of left female breast: Secondary | ICD-10-CM

## 2019-10-28 DIAGNOSIS — Z5112 Encounter for antineoplastic immunotherapy: Secondary | ICD-10-CM | POA: Diagnosis not present

## 2019-10-28 DIAGNOSIS — Z95828 Presence of other vascular implants and grafts: Secondary | ICD-10-CM

## 2019-10-28 LAB — CMP (CANCER CENTER ONLY)
ALT: 29 U/L (ref 0–44)
AST: 19 U/L (ref 15–41)
Albumin: 3.8 g/dL (ref 3.5–5.0)
Alkaline Phosphatase: 97 U/L (ref 38–126)
Anion gap: 11 (ref 5–15)
BUN: 18 mg/dL (ref 6–20)
CO2: 22 mmol/L (ref 22–32)
Calcium: 8.7 mg/dL — ABNORMAL LOW (ref 8.9–10.3)
Chloride: 107 mmol/L (ref 98–111)
Creatinine: 0.79 mg/dL (ref 0.44–1.00)
GFR, Est AFR Am: >60 mL/min (ref >60)
GFR, Estimated: >60 mL/min (ref >60)
Glucose, Bld: 98 mg/dL (ref 70–99)
Potassium: 4.1 mmol/L (ref 3.5–5.1)
Sodium: 140 mmol/L (ref 135–145)
Total Bilirubin: 0.3 mg/dL (ref 0.3–1.2)
Total Protein: 6.8 g/dL (ref 6.5–8.1)

## 2019-10-28 LAB — CBC WITH DIFFERENTIAL (CANCER CENTER ONLY)
Abs Immature Granulocytes: 0.07 10*3/uL (ref 0.00–0.07)
Basophils Absolute: 0 10*3/uL (ref 0.0–0.1)
Basophils Relative: 0 %
Eosinophils Absolute: 0.1 10*3/uL (ref 0.0–0.5)
Eosinophils Relative: 2 %
HCT: 38.8 % (ref 36.0–46.0)
Hemoglobin: 13.1 g/dL (ref 12.0–15.0)
Immature Granulocytes: 2 %
Lymphocytes Relative: 38 %
Lymphs Abs: 1.8 10*3/uL (ref 0.7–4.0)
MCH: 32 pg (ref 26.0–34.0)
MCHC: 33.8 g/dL (ref 30.0–36.0)
MCV: 94.9 fL (ref 80.0–100.0)
Monocytes Absolute: 0.3 10*3/uL (ref 0.1–1.0)
Monocytes Relative: 7 %
Neutro Abs: 2.4 10*3/uL (ref 1.7–7.7)
Neutrophils Relative %: 51 %
Platelet Count: 271 10*3/uL (ref 150–400)
RBC: 4.09 MIL/uL (ref 3.87–5.11)
RDW: 14.7 % (ref 11.5–15.5)
WBC Count: 4.7 10*3/uL (ref 4.0–10.5)
nRBC: 0 % (ref 0.0–0.2)

## 2019-10-28 MED ORDER — SODIUM CHLORIDE 0.9 % IV SOLN
10.0000 mg | Freq: Once | INTRAVENOUS | Status: AC
  Administered 2019-10-28: 10 mg via INTRAVENOUS
  Filled 2019-10-28: qty 10

## 2019-10-28 MED ORDER — FAMOTIDINE IN NACL 20-0.9 MG/50ML-% IV SOLN
20.0000 mg | Freq: Once | INTRAVENOUS | Status: AC
  Administered 2019-10-28: 20 mg via INTRAVENOUS

## 2019-10-28 MED ORDER — HEPARIN SOD (PORK) LOCK FLUSH 100 UNIT/ML IV SOLN
500.0000 [IU] | Freq: Once | INTRAVENOUS | Status: AC | PRN
  Administered 2019-10-28: 500 [IU]
  Filled 2019-10-28: qty 5

## 2019-10-28 MED ORDER — ACETAMINOPHEN 325 MG PO TABS
650.0000 mg | ORAL_TABLET | Freq: Once | ORAL | Status: AC
  Administered 2019-10-28: 650 mg via ORAL

## 2019-10-28 MED ORDER — DIPHENHYDRAMINE HCL 50 MG/ML IJ SOLN
25.0000 mg | Freq: Once | INTRAMUSCULAR | Status: AC
  Administered 2019-10-28: 25 mg via INTRAVENOUS

## 2019-10-28 MED ORDER — FAMOTIDINE IN NACL 20-0.9 MG/50ML-% IV SOLN
INTRAVENOUS | Status: AC
  Filled 2019-10-28: qty 50

## 2019-10-28 MED ORDER — ACETAMINOPHEN 325 MG PO TABS
ORAL_TABLET | ORAL | Status: AC
  Filled 2019-10-28: qty 2

## 2019-10-28 MED ORDER — SODIUM CHLORIDE 0.9 % IV SOLN
Freq: Once | INTRAVENOUS | Status: AC
  Filled 2019-10-28: qty 250

## 2019-10-28 MED ORDER — SODIUM CHLORIDE 0.9 % IV SOLN
80.0000 mg/m2 | Freq: Once | INTRAVENOUS | Status: AC
  Administered 2019-10-28: 138 mg via INTRAVENOUS
  Filled 2019-10-28: qty 23

## 2019-10-28 MED ORDER — DIPHENHYDRAMINE HCL 50 MG/ML IJ SOLN
INTRAMUSCULAR | Status: AC
  Filled 2019-10-28: qty 1

## 2019-10-28 MED ORDER — SODIUM CHLORIDE 0.9% FLUSH
10.0000 mL | INTRAVENOUS | Status: DC | PRN
  Administered 2019-10-28: 10 mL
  Filled 2019-10-28: qty 10

## 2019-10-28 MED ORDER — SODIUM CHLORIDE 0.9% FLUSH
10.0000 mL | Freq: Once | INTRAVENOUS | Status: AC
  Administered 2019-10-28: 10 mL
  Filled 2019-10-28: qty 10

## 2019-10-28 MED ORDER — TRASTUZUMAB-DKST CHEMO 150 MG IV SOLR
150.0000 mg | Freq: Once | INTRAVENOUS | Status: AC
  Administered 2019-10-28: 150 mg via INTRAVENOUS
  Filled 2019-10-28: qty 7.14

## 2019-10-28 NOTE — Patient Instructions (Signed)
[facility] Discharge Instructions for Patients Receiving Chemotherapy  Today you received the following chemotherapy agents: trastuzumab and paclitaxel.  To help prevent nausea and vomiting after your treatment, we encourage you to take your nausea medication as directed.   If you develop nausea and vomiting that is not controlled by your nausea medication, call the clinic.   BELOW ARE SYMPTOMS THAT SHOULD BE REPORTED IMMEDIATELY:  *FEVER GREATER THAN 100.5 F  *CHILLS WITH OR WITHOUT FEVER  NAUSEA AND VOMITING THAT IS NOT CONTROLLED WITH YOUR NAUSEA MEDICATION  *UNUSUAL SHORTNESS OF BREATH  *UNUSUAL BRUISING OR BLEEDING  TENDERNESS IN MOUTH AND THROAT WITH OR WITHOUT PRESENCE OF ULCERS  *URINARY PROBLEMS  *BOWEL PROBLEMS  UNUSUAL RASH Items with * indicate a potential emergency and should be followed up as soon as possible.  Feel free to call the clinic should you have any questions or concerns. The clinic phone number is [phone].  Please show the CHEMO ALERT CARD at check-in to the Emergency Department and triage nurse.   

## 2019-10-28 NOTE — Progress Notes (Signed)
[facility] CSW Progress Note  Holiday representative met with patient to offer brief counseling. CSW provided space for Baylynn to voice her worries and struggles through treatment. She was coping fairly well until having a "down" week about 2 weeks ago. 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 Since then, she has made efforts to see her friends and talk with them. She is also walking regularly and listening to music to cope. CSW normalized feelings and discussed common emotions through treatment.   Began work on identifying specific worries about returning to school (appearance, what kids might say) and how to prepare in advance as well as ways to react and cope if they happen. Began CBT work to identify alternative thoughts and ways to recognize positive attributes.   Follow up: in 1 week. Patient will call once she has her calendar to schedule.  Edwinna Areola Noami Bove LCSW

## 2019-10-28 NOTE — Patient Instructions (Signed)

## 2019-11-01 ENCOUNTER — Other Ambulatory Visit: Payer: Self-pay

## 2019-11-01 ENCOUNTER — Encounter: Payer: Self-pay | Admitting: Hematology and Oncology

## 2019-11-01 ENCOUNTER — Ambulatory Visit: Payer: BC Managed Care – PPO | Attending: General Surgery

## 2019-11-01 DIAGNOSIS — Z483 Aftercare following surgery for neoplasm: Secondary | ICD-10-CM | POA: Insufficient documentation

## 2019-11-01 NOTE — Therapy (Signed)
[place], [place], [address] Phone: [phone]   Fax:  [phone]  Physical Therapy Treatment  Patient Details  Name: [name] MRN: [id number] Date of Birth: [birth year] Referring Provider (PT): Dr. Rolm Bookbinder   Encounter Date: 11/01/2019   PT End of Session - 11/01/19 1525    Visit Number 5    Number of Visits 6    PT Start Time 2094    PT Stop Time 1525    PT Time Calculation (min) 10 min           Past Medical History:  Diagnosis Date  . Arthritis   . Cancer [facility])    Left breast  . Headache    migraine     Past Surgical History:  Procedure Laterality Date  . ARTERY BIOPSY Right 11/11/2018   Procedure: BIOPSY TEMPORAL ARTERY;  Surgeon: Rosetta Posner, MD;  Location: [facility];  Service: Vascular;  Laterality: Right;  . BREAST LUMPECTOMY WITH RADIOACTIVE SEED AND SENTINEL LYMPH NODE BIOPSY Left 08/05/2019   Procedure: LEFT BREAST LUMPECTOMY WITH RADIOACTIVE SEED AND LEFT AXILLARY SENTINEL LYMPH NODE BIOPSY;  Surgeon: Rolm Bookbinder, MD;  Location: [facility];  Service: General;  Laterality: Left;  PEC BLOCK  . CESAREAN SECTION     x2  . PORTACATH PLACEMENT Right 08/05/2019   Procedure: INSERTION PORT-A-CATH WITH ULTRASOUND GUIDANCE;  Surgeon: Rolm Bookbinder, MD;  Location: [place];  Service: General;  Laterality: Right;    There were no vitals filed for this visit.   Subjective Assessment - 11/01/19 1524    Subjective Patient here today for 3 month L-Dex screen                  L-DEX FLOWSHEETS - 11/01/19 1500      L-DEX LYMPHEDEMA SCREENING   Measurement Type Unilateral    L-DEX MEASUREMENT EXTREMITY Upper Extremity    POSITION  Standing    DOMINANT SIDE Right    At Risk Side Left    BASELINE SCORE (UNILATERAL) -2.4    L-DEX SCORE (UNILATERAL) 2.2    VALUE CHANGE (UNILAT) 4.6                                  PT Long Term Goals -  10/06/19 1023      PT LONG TERM GOAL #1   Title Patient will demonstrate she has regained full shoulder ROM and function post operatively compared to baselines.    Status Achieved      PT LONG TERM GOAL #2   Title Patient will increase left shoulder active flexion to >/= 140 degrees to increased ease reaching.    Baseline 130 post op; 140 baseline; 144 degrees - 10/06/19    Status Achieved      PT LONG TERM GOAL #3   Title Patient will increase left shoulder active abduction to >/= 150 degrees to increased ease reaching.    Baseline 125 post op; 157 baseline; 159 abduction - 10/06/19    Status Achieved      PT LONG TERM GOAL #4   Title Patient will decrease DASH score to </= 8 for improved overall upper extremity function.    Baseline 4.55 baseline; 20.45 post op; 4.55 - 10/06/19    Status Achieved      PT LONG TERM GOAL #5   Title Patient will verbalize good understanding of risk reduction  practices for lymphedema.    Baseline Pt reports feeling well educated with this now - 10/06/19    Status Achieved                 Plan - 11/01/19 1525    Clinical Impression Statement Within normal range    PT Next Visit Plan Continue L-Dex screens every 3 months           Patient will benefit from skilled therapeutic intervention in order to improve the following deficits and impairments:     Visit Diagnosis: Aftercare following surgery for neoplasm     Problem List Patient Active Problem List   Diagnosis Date Noted  . Port-A-Cath in place 10/07/2019  . Malignant neoplasm of upper-outer quadrant of left breast in female, estrogen receptor negative ([place]) 07/27/2019  . Neuroretinitis of both eyes 11/14/2018  . Retro-orbital pain   . Papilledema    [name], [place] 11/01/19 3:27 PM  [place] [facility], [place], [address] Phone: [phone]   Fax:  [phone]  Name: [name] MRN:  [id number] Date of Birth: 10/19/1965

## 2019-11-01 NOTE — Progress Notes (Signed)
Received voicemail from patient regarding applying for [employer]. Based on verbal guidelines, patient states she is over the income and does not qualify. Advised patient if anything else come available, I will reach out to her to let her know. She was very Patent attorney.  Called patient to ask if she has met her ded/OOP for copay review. Patient states she has met her ded but unsure about OOP. Advised patient I can enroll in copay assistance online for treatment drug [facility]) with her permission. She gave me permission to enroll.  Enrolled patient in copay assistance for Og[employer]ri online via [employer]. A copy of the approval letter will be faxed to me and I will give to [facility] for billing/copay submissions. A copy will be mailed to patient. This will support if insurance leaves her with a balance after they make payment.  She has my card for any additional financial questions or concerns.

## 2019-11-03 ENCOUNTER — Encounter: Payer: Self-pay | Admitting: Hematology and Oncology

## 2019-11-03 ENCOUNTER — Telehealth: Payer: Self-pay | Admitting: Medical Oncology

## 2019-11-03 MED FILL — Dexamethasone Sodium Phosphate Inj 100 MG/10ML: INTRAMUSCULAR | Qty: 1 | Status: AC

## 2019-11-03 NOTE — Progress Notes (Signed)
Patient Care Team: Antony Blackbird, MD as PCP - General (Family Medicine) Mauro Kaufmann, RN as Oncology Nurse Navigator Rockwell Germany, RN as Oncology Nurse Navigator Rolm Bookbinder, MD as Consulting Physician (General Surgery) Nicholas Lose, MD as Consulting Physician (Hematology and Oncology) Kyung Rudd, MD as Consulting Physician (Radiation Oncology)  DIAGNOSIS:    ICD-10-CM   1. Malignant neoplasm of upper-outer quadrant of left breast in female, estrogen receptor negative ([place])  C50.412    Z17.1     SUMMARY OF ONCOLOGIC HISTORY: Oncology History  Malignant neoplasm of upper-outer quadrant of left breast in female, estrogen receptor negative ([place])  07/27/2019 Initial Diagnosis   Screening mammogram showed a left breast asymmetry. Diagnostic mammogram and US showed a 1.2cm left breast mass, 12:30 position, no left axillary adenopathy. 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   08/05/2019 Surgery   Left lumpectomy: Grade 3 IDC, 2 cm with high-grade DCIS, lymphovascular invasion was identified, 1 lymph node negative, margins negative, ER 0%, PR 0%, HER-2 positive, Ki-67 90%   08/05/2019 Cancer Staging   Staging form: Breast, AJCC 8th Edition - Pathologic stage from 08/05/2019: Stage IIA (pT2, pN0, cM0, G3, ER-, PR-, HER2+) - Signed by Gardenia Phlegm, NP on 08/18/2019   08/30/2019 -  Chemotherapy   The patient had PACLitaxel (TAXOL) 138 mg in sodium chloride 0.9 % 250 mL chemo infusion (</= 56m/m2), 80 mg/m2 = 138 mg, Intravenous,  Once, 3 of 3 cycles Administration: 138 mg (08/30/2019), 138 mg (09/06/2019), 138 mg (09/30/2019), 138 mg (09/13/2019), 138 mg (09/22/2019), 138 mg (10/07/2019), 138 mg (10/15/2019), 138 mg (10/21/2019), 138 mg (10/28/2019) trastuzumab-dkst (OGIVRI) 273 mg in sodium chloride 0.9 % 250 mL chemo infusion, 4 mg/kg = 273 mg, Intravenous,  Once, 3 of 16 cycles Administration: 273 mg (08/30/2019), 150 mg (09/06/2019), 150 mg  (09/30/2019), 150 mg (09/13/2019), 150 mg (09/22/2019), 150 mg (10/07/2019), 150 mg (10/15/2019), 150 mg (10/21/2019), 150 mg (10/28/2019)  for chemotherapy treatment.      CHIEF COMPLIANT: Cycle10Taxol Herceptin  INTERVAL HISTORY: Sherri OLTHOFFis a 54y.o. with above-mentioned history of left breast cancerwhounderwent a lumpectomyand is currently on adjuvant chemotherapy with Taxol Herceptin.She is a participant in the neuropathy research study.She presents to the clinic todayfora toxicity check and cycle 10.   Denies any numbness or tingling in her fingers.  She does have more fatigue.  She does have diarrhea after the treatment for 1 to 2 days.  ALLERGIES:  has No Known Allergies.  MEDICATIONS:  Current Outpatient Medications  Medication Sig Dispense Refill  . ibuprofen (ADVIL) 200 MG tablet Take 400-600 mg by mouth every 8 (eight) hours as needed for moderate pain.     .Marland Kitchenlidocaine-prilocaine (EMLA) cream Apply to affected area once 30 g 3  . LORazepam (ATIVAN) 0.5 MG tablet Take 1 tablet (0.5 mg total) by mouth at bedtime as needed (Nausea or vomiting). 30 tablet 1  . Multiple Vitamin (MULTIVITAMIN WITH MINERALS) TABS tablet Take 1 tablet by mouth daily. One-A-Day for Women 50+    . ondansetron (ZOFRAN) 8 MG tablet Take 1 tablet (8 mg total) by mouth 2 (two) times daily as needed (Nausea or vomiting). 30 tablet 1  . prochlorperazine (COMPAZINE) 10 MG tablet TAKE 1 TABLET(10 MG) BY MOUTH EVERY 6 HOURS AS NEEDED FOR NAUSEA OR VOMITING 30 tablet 1   No current facility-administered medications for this visit.    PHYSICAL EXAMINATION: ECOG PERFORMANCE STATUS: 1 -  Symptomatic but completely ambulatory  Vitals:   11/04/19 1003  BP: 127/74  Pulse: 89  Resp: 18  Temp: 99.1 F (37.3 C)  SpO2: 100%   Filed Weights   11/04/19 1003  Weight: 155 lb 6.4 oz (70.5 kg)    LABORATORY DATA:  I have reviewed the data as listed CMP Latest Ref Rng & Units 10/28/2019 10/21/2019 10/15/2019    Glucose 70 - 99 mg/dL 98 102(H) 90  BUN 6 - 20 mg/dL 18 17 20   Creatinine 0.44 - 1.00 mg/dL 0.79 0.80 0.89  Sodium 135 - 145 mmol/L 140 141 140  Potassium 3.5 - 5.1 mmol/L 4.1 4.0 4.0  Chloride 98 - 111 mmol/L 107 109 107  CO2 22 - 32 mmol/L 22 22 21(L)  Calcium 8.9 - 10.3 mg/dL 8.7(L) 8.6(L) 8.8(L)  Total Protein 6.5 - 8.1 g/dL 6.8 6.6 6.5  Total Bilirubin 0.3 - 1.2 mg/dL 0.3 0.4 0.3  Alkaline Phos 38 - 126 U/L 97 93 96  AST 15 - 41 U/L 19 25 18   ALT 0 - 44 U/L 29 32 25    Lab Results  Component Value Date   WBC 3.7 (L) 11/04/2019   HGB 12.4 11/04/2019   HCT 37.0 11/04/2019   MCV 95.9 11/04/2019   PLT 264 11/04/2019   NEUTROABS 1.6 (L) 11/04/2019    ASSESSMENT & PLAN:  Malignant neoplasm of upper-outer quadrant of left breast in female, estrogen receptor negative ([place]) 07/27/2019:Screening mammogram showed a left breast asymmetry. Diagnostic mammogram and US showed a 1.2cm left breast mass, 12:30 position, no left axillary adenopathy. 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 T1c N0 stage Ia clinical stage  Recommendation: 1.08/05/2019:Left lumpectomy: Grade 3 IDC, 2 cm with high-grade DCIS, lymphovascular invasion was identified, 1 lymph node negative, margins negative, ER 0%, PR 0%, HER-2 positive, Ki-67 90% 2.Adjuvant chemotherapy with Taxol Herceptin followed by Herceptin maintenance for 1 yearstarted 08/30/2019 3.Adjuvant radiation therapy Patient is participating inSWOG S 1714neuropathy clinical trial --------------------------------------------------------------------------------------------------------------------------------------------------------------- Current treatment: Cycle10Taxol Herceptin Echocardiogram: 08/02/2019: EF 60 to 65%  Chemo toxicities: Denies neuropathy. She gets diarrhea after treatment for 1 to 2 days probably resulting from Herceptin.  Monitoring closely for toxicities Return to clinic for 2 more  rounds of chemo.  I will see her with cycle 12. We will refer her for radiation after chemo is complete.  She stays active and exercises regularly.  No orders of the defined types were placed in this encounter.  The patient has a good understanding of the overall plan. she agrees with it. she will call with any problems that may develop before the next visit here.  Total time spent: 30 mins including face to face time and time spent for planning, charting and coordination of care  Nicholas Lose, MD 11/04/2019  I, Cloyde Reams Dorshimer, am acting as scribe for Dr. Nicholas Lose.  I have reviewed the above documentation for accuracy and completeness, and I agree with the above.

## 2019-11-03 NOTE — Progress Notes (Signed)
Received approval letter from [employer] for [employer].  Patient approved 11/20/19 - 10/31/20. With her copay being $0 after insurance pays their portion with up to $25,000 per 12 month period.  Patient will also receive a copy of the approval letter in the mail for her records only.  Copy given to [facility] for billing/ copay claim submissions along with the Virtual card information.

## 2019-11-03 NOTE — Telephone Encounter (Signed)
[id number]: 8 week visit LVMOM with patient informing her of tomorrow's study assessments, during her clinic visit. Informed patient of what assessments to expect. Patient thanked and encouraged to call with questions.  Maxwell Marion, RN, BSN, [facility] Clinical Research 11/03/2019 1:37 PM

## 2019-11-04 ENCOUNTER — Inpatient Hospital Stay: Payer: BC Managed Care – PPO

## 2019-11-04 ENCOUNTER — Telehealth: Payer: Self-pay | Admitting: *Deleted

## 2019-11-04 ENCOUNTER — Inpatient Hospital Stay: Payer: BC Managed Care – PPO | Admitting: Hematology and Oncology

## 2019-11-04 ENCOUNTER — Encounter: Payer: Self-pay | Admitting: Medical Oncology

## 2019-11-04 ENCOUNTER — Encounter: Payer: Self-pay | Admitting: *Deleted

## 2019-11-04 ENCOUNTER — Other Ambulatory Visit: Payer: Self-pay | Admitting: *Deleted

## 2019-11-04 ENCOUNTER — Other Ambulatory Visit: Payer: Self-pay

## 2019-11-04 DIAGNOSIS — C50412 Malignant neoplasm of upper-outer quadrant of left female breast: Secondary | ICD-10-CM | POA: Diagnosis not present

## 2019-11-04 DIAGNOSIS — Z006 Encounter for examination for normal comparison and control in clinical research program: Secondary | ICD-10-CM

## 2019-11-04 DIAGNOSIS — Z171 Estrogen receptor negative status [ER-]: Secondary | ICD-10-CM

## 2019-11-04 DIAGNOSIS — Z95828 Presence of other vascular implants and grafts: Secondary | ICD-10-CM

## 2019-11-04 DIAGNOSIS — Z5112 Encounter for antineoplastic immunotherapy: Secondary | ICD-10-CM | POA: Diagnosis not present

## 2019-11-04 LAB — CMP (CANCER CENTER ONLY)
ALT: 24 U/L (ref 0–44)
AST: 19 U/L (ref 15–41)
Albumin: 3.5 g/dL (ref 3.5–5.0)
Alkaline Phosphatase: 82 U/L (ref 38–126)
Anion gap: 9 (ref 5–15)
BUN: 15 mg/dL (ref 6–20)
CO2: 22 mmol/L (ref 22–32)
Calcium: 8.2 mg/dL — ABNORMAL LOW (ref 8.9–10.3)
Chloride: 109 mmol/L (ref 98–111)
Creatinine: 0.76 mg/dL (ref 0.44–1.00)
GFR, Est AFR Am: >60 mL/min (ref >60)
GFR, Estimated: >60 mL/min (ref >60)
Glucose, Bld: 101 mg/dL — ABNORMAL HIGH (ref 70–99)
Potassium: 4.1 mmol/L (ref 3.5–5.1)
Sodium: 140 mmol/L (ref 135–145)
Total Bilirubin: 0.3 mg/dL (ref 0.3–1.2)
Total Protein: 6.4 g/dL — ABNORMAL LOW (ref 6.5–8.1)

## 2019-11-04 LAB — CBC WITH DIFFERENTIAL (CANCER CENTER ONLY)
Abs Immature Granulocytes: 0.04 10*3/uL (ref 0.00–0.07)
Basophils Absolute: 0 10*3/uL (ref 0.0–0.1)
Basophils Relative: 1 %
Eosinophils Absolute: 0.1 10*3/uL (ref 0.0–0.5)
Eosinophils Relative: 2 %
HCT: 37 % (ref 36.0–46.0)
Hemoglobin: 12.4 g/dL (ref 12.0–15.0)
Immature Granulocytes: 1 %
Lymphocytes Relative: 45 %
Lymphs Abs: 1.7 10*3/uL (ref 0.7–4.0)
MCH: 32.1 pg (ref 26.0–34.0)
MCHC: 33.5 g/dL (ref 30.0–36.0)
MCV: 95.9 fL (ref 80.0–100.0)
Monocytes Absolute: 0.3 10*3/uL (ref 0.1–1.0)
Monocytes Relative: 7 %
Neutro Abs: 1.6 10*3/uL — ABNORMAL LOW (ref 1.7–7.7)
Neutrophils Relative %: 44 %
Platelet Count: 264 10*3/uL (ref 150–400)
RBC: 3.86 MIL/uL — ABNORMAL LOW (ref 3.87–5.11)
RDW: 15 % (ref 11.5–15.5)
WBC Count: 3.7 10*3/uL — ABNORMAL LOW (ref 4.0–10.5)
nRBC: 0 % (ref 0.0–0.2)

## 2019-11-04 MED ORDER — HEPARIN SOD (PORK) LOCK FLUSH 100 UNIT/ML IV SOLN
500.0000 [IU] | Freq: Once | INTRAVENOUS | Status: AC | PRN
  Administered 2019-11-04: 500 [IU]
  Filled 2019-11-04: qty 5

## 2019-11-04 MED ORDER — TRASTUZUMAB-DKST CHEMO 150 MG IV SOLR
150.0000 mg | Freq: Once | INTRAVENOUS | Status: AC
  Administered 2019-11-04: 150 mg via INTRAVENOUS
  Filled 2019-11-04: qty 7.14

## 2019-11-04 MED ORDER — ACETAMINOPHEN 325 MG PO TABS
650.0000 mg | ORAL_TABLET | Freq: Once | ORAL | Status: AC
  Administered 2019-11-04: 650 mg via ORAL

## 2019-11-04 MED ORDER — SODIUM CHLORIDE 0.9% FLUSH
10.0000 mL | INTRAVENOUS | Status: DC | PRN
  Administered 2019-11-04: 10 mL
  Filled 2019-11-04: qty 10

## 2019-11-04 MED ORDER — DIPHENHYDRAMINE HCL 50 MG/ML IJ SOLN
25.0000 mg | Freq: Once | INTRAMUSCULAR | Status: AC
  Administered 2019-11-04: 25 mg via INTRAVENOUS

## 2019-11-04 MED ORDER — SODIUM CHLORIDE 0.9 % IV SOLN
80.0000 mg/m2 | Freq: Once | INTRAVENOUS | Status: AC
  Administered 2019-11-04: 138 mg via INTRAVENOUS
  Filled 2019-11-04: qty 23

## 2019-11-04 MED ORDER — ACETAMINOPHEN 325 MG PO TABS
ORAL_TABLET | ORAL | Status: AC
  Filled 2019-11-04: qty 2

## 2019-11-04 MED ORDER — SODIUM CHLORIDE 0.9% FLUSH
10.0000 mL | Freq: Once | INTRAVENOUS | Status: AC
  Administered 2019-11-04: 10 mL
  Filled 2019-11-04: qty 10

## 2019-11-04 MED ORDER — FAMOTIDINE IN NACL 20-0.9 MG/50ML-% IV SOLN
INTRAVENOUS | Status: AC
  Filled 2019-11-04: qty 50

## 2019-11-04 MED ORDER — FAMOTIDINE IN NACL 20-0.9 MG/50ML-% IV SOLN
20.0000 mg | Freq: Once | INTRAVENOUS | Status: AC
  Administered 2019-11-04: 20 mg via INTRAVENOUS

## 2019-11-04 MED ORDER — SODIUM CHLORIDE 0.9 % IV SOLN
Freq: Once | INTRAVENOUS | Status: AC
  Filled 2019-11-04: qty 250

## 2019-11-04 MED ORDER — DIPHENHYDRAMINE HCL 50 MG/ML IJ SOLN
INTRAMUSCULAR | Status: AC
  Filled 2019-11-04: qty 1

## 2019-11-04 NOTE — Telephone Encounter (Signed)
error 

## 2019-11-04 NOTE — Progress Notes (Signed)
K1601, A PROSPECTIVE OBSERVATIONAL COHORT STUDY TO DEVELOP A PREDICTIVE MODEL OF TAXANE-INDUCED PERIPHERAL NEUROPATHY IN CANCER PATIENTS. Week 8 visit Patient presented to the clinic, alone today, for assessments and treatment. I met with patient after her lab appointment and prior to her office visit with Dr. Lindi Adie.   Patient and I reviewed her medication list, and patient confirms to not be taking anything for neuropathy. Patient denies having any new or concerning issues. Patient confirms no new or worsening issues and does not feel that she is experiencing any  neuropathy related to chemotherapy treatments. Patient reports to continue with using ice to bilateral hands during taxol treatment for preventative purposes. At today's visit, I informed patient of the study's recent revision 5, which does not require a re-consent, but study does want [place] to inform participants that enrollment for study participation has increased from 1000 to approximately 1310. Patient gave her verbal understand to this and denied having any questions.   PROs: Questionnaires were given to patient to complete upon arrival to clinic. Completed questionnaires were collected and checked for completeness and accuracy.  Labs: No labs for study to be collected. Patient did not consent to the optional labs.  Physician Assessments: CTCAE and Treatment Burden assessment forms completed and signed by Dr. Lindi Adie. History of Falls: Not required collection at this time point.  Assessment for Interventions for CIPN: Reviewed with patient and CRFs completed. Neuropen Assessment: Completed per protocol, by this certified research RN, with time and documentation completed by RN, Wilber Bihari.  [place] Assessment: Completed per protocol, by this Film/video editor, with time and documentation completed by RN, Wilber Bihari.  Timed Get Up and Go Test: Not required collection at this time point.  Plan: Patient informed, next  study assessments in approximately 4 weeks. Plan for this next visit to be in August. Patient denied having any questions at this time. Patient was thanked for her time and continued contribution and support of study and she was  encouraged to call clinic for any questions or concerns she may have prior to her next appointment.  Maxwell Marion, RN, BSN, [facility] Clinical Research 11/04/2019 10:29 AM

## 2019-11-04 NOTE — Assessment & Plan Note (Signed)
07/27/2019:Screening mammogram showed a left breast asymmetry. Diagnostic mammogram and US showed a 1.2cm left breast mass, 12:30 position, no left axillary adenopathy. 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 T1c N0 stage Ia clinical stage  Recommendation: 1.08/05/2019:Left lumpectomy: Grade 3 IDC, 2 cm with high-grade DCIS, lymphovascular invasion was identified, 1 lymph node negative, margins negative, ER 0%, PR 0%, HER-2 positive, Ki-67 90% 2.Adjuvant chemotherapy with Taxol Herceptin followed by Herceptin maintenance for 1 yearstarted 08/30/2019 3.Adjuvant radiation therapy Patient is participating inSWOG S 1714neuropathy clinical trial --------------------------------------------------------------------------------------------------------------------------------------------------------------- Current treatment: Cycle10Taxol Herceptin Echocardiogram: 08/02/2019: EF 60 to 65%  Chemo toxicities: Depression: We connected her with her Education officer, museum. Denies neuropathy.  Monitoring closely for toxicities Return to clinic for 2 more rounds of chemo.  I will see her with cycle 12. We will refer her for radiation after chemo is complete.

## 2019-11-04 NOTE — Patient Instructions (Signed)
[place] Discharge Instructions for Patients Receiving Chemotherapy  Today you received the following chemotherapy agents: trastuzumab, Taxol  To help prevent nausea and vomiting after your treatment, we encourage you to take your nausea medication as directed.   If you develop nausea and vomiting that is not controlled by your nausea medication, call the clinic.   BELOW ARE SYMPTOMS THAT SHOULD BE REPORTED IMMEDIATELY:  *FEVER GREATER THAN 100.5 F  *CHILLS WITH OR WITHOUT FEVER  NAUSEA AND VOMITING THAT IS NOT CONTROLLED WITH YOUR NAUSEA MEDICATION  *UNUSUAL SHORTNESS OF BREATH  *UNUSUAL BRUISING OR BLEEDING  TENDERNESS IN MOUTH AND THROAT WITH OR WITHOUT PRESENCE OF ULCERS  *URINARY PROBLEMS  *BOWEL PROBLEMS  UNUSUAL RASH Items with * indicate a potential emergency and should be followed up as soon as possible.  Feel free to call the clinic should you have any questions or concerns. The clinic phone number is [phone].  Please show the [name] at check-in to the Emergency Department and triage nurse.

## 2019-11-05 ENCOUNTER — Telehealth: Payer: Self-pay | Admitting: Licensed Clinical Social Worker

## 2019-11-05 ENCOUNTER — Telehealth: Payer: Self-pay | Admitting: Hematology and Oncology

## 2019-11-05 NOTE — Telephone Encounter (Signed)
[place] Work  [employer] representative attempted to contact patient by phone to check-in on coping this week and potentially schedule another in-person visit. No answer. Left VM with direct call-back number.    Edwinna Areola Ahmiyah Coil , LCSW

## 2019-11-05 NOTE — Telephone Encounter (Signed)
No 7/15 los, no changes made to pt schedule  

## 2019-11-08 ENCOUNTER — Telehealth: Payer: Self-pay | Admitting: *Deleted

## 2019-11-08 NOTE — Telephone Encounter (Signed)
LVM for call back to schedule appointment

## 2019-11-09 ENCOUNTER — Telehealth: Payer: Self-pay | Admitting: Licensed Clinical Social Worker

## 2019-11-09 NOTE — Telephone Encounter (Signed)
[place] CSW Progress Note  Clinical Education officer, museum received call from patient. Stated she has been feeling better and is seeing her friends more frequently which has helped significantly. Does not need a counseling visit this week, but may call in the future. CSW also shared information on [email or web address] as patient was wanting headwear that may feel better for her to wear at school.    Edwinna Areola Bookert Guzzi , LCSW

## 2019-11-11 ENCOUNTER — Other Ambulatory Visit: Payer: Self-pay

## 2019-11-11 ENCOUNTER — Inpatient Hospital Stay: Payer: BC Managed Care – PPO

## 2019-11-11 VITALS — BP 130/77 | HR 84 | Temp 98.1°F | Resp 16

## 2019-11-11 DIAGNOSIS — Z5112 Encounter for antineoplastic immunotherapy: Secondary | ICD-10-CM | POA: Diagnosis not present

## 2019-11-11 DIAGNOSIS — C50412 Malignant neoplasm of upper-outer quadrant of left female breast: Secondary | ICD-10-CM

## 2019-11-11 DIAGNOSIS — Z95828 Presence of other vascular implants and grafts: Secondary | ICD-10-CM

## 2019-11-11 LAB — CMP (CANCER CENTER ONLY)
ALT: 26 U/L (ref 0–44)
AST: 22 U/L (ref 15–41)
Albumin: 3.7 g/dL (ref 3.5–5.0)
Alkaline Phosphatase: 91 U/L (ref 38–126)
Anion gap: 8 (ref 5–15)
BUN: 15 mg/dL (ref 6–20)
CO2: 24 mmol/L (ref 22–32)
Calcium: 9 mg/dL (ref 8.9–10.3)
Chloride: 109 mmol/L (ref 98–111)
Creatinine: 0.79 mg/dL (ref 0.44–1.00)
GFR, Est AFR Am: >60 mL/min (ref >60)
GFR, Estimated: >60 mL/min (ref >60)
Glucose, Bld: 95 mg/dL (ref 70–99)
Potassium: 4.3 mmol/L (ref 3.5–5.1)
Sodium: 141 mmol/L (ref 135–145)
Total Bilirubin: 0.3 mg/dL (ref 0.3–1.2)
Total Protein: 6.7 g/dL (ref 6.5–8.1)

## 2019-11-11 LAB — CBC WITH DIFFERENTIAL (CANCER CENTER ONLY)
Abs Immature Granulocytes: 0.05 10*3/uL (ref 0.00–0.07)
Basophils Absolute: 0 10*3/uL (ref 0.0–0.1)
Basophils Relative: 1 %
Eosinophils Absolute: 0.1 10*3/uL (ref 0.0–0.5)
Eosinophils Relative: 2 %
HCT: 37.4 % (ref 36.0–46.0)
Hemoglobin: 12.4 g/dL (ref 12.0–15.0)
Immature Granulocytes: 1 %
Lymphocytes Relative: 47 %
Lymphs Abs: 1.8 10*3/uL (ref 0.7–4.0)
MCH: 31.4 pg (ref 26.0–34.0)
MCHC: 33.2 g/dL (ref 30.0–36.0)
MCV: 94.7 fL (ref 80.0–100.0)
Monocytes Absolute: 0.3 10*3/uL (ref 0.1–1.0)
Monocytes Relative: 8 %
Neutro Abs: 1.5 10*3/uL — ABNORMAL LOW (ref 1.7–7.7)
Neutrophils Relative %: 41 %
Platelet Count: 275 10*3/uL (ref 150–400)
RBC: 3.95 MIL/uL (ref 3.87–5.11)
RDW: 15.1 % (ref 11.5–15.5)
WBC Count: 3.8 10*3/uL — ABNORMAL LOW (ref 4.0–10.5)
nRBC: 0 % (ref 0.0–0.2)

## 2019-11-11 MED ORDER — HEPARIN SOD (PORK) LOCK FLUSH 100 UNIT/ML IV SOLN
500.0000 [IU] | Freq: Once | INTRAVENOUS | Status: AC | PRN
  Administered 2019-11-11: 500 [IU]
  Filled 2019-11-11: qty 5

## 2019-11-11 MED ORDER — FAMOTIDINE IN NACL 20-0.9 MG/50ML-% IV SOLN
INTRAVENOUS | Status: AC
  Filled 2019-11-11: qty 50

## 2019-11-11 MED ORDER — ACETAMINOPHEN 325 MG PO TABS
650.0000 mg | ORAL_TABLET | Freq: Once | ORAL | Status: AC
  Administered 2019-11-11: 650 mg via ORAL

## 2019-11-11 MED ORDER — ACETAMINOPHEN 325 MG PO TABS
ORAL_TABLET | ORAL | Status: AC
  Filled 2019-11-11: qty 2

## 2019-11-11 MED ORDER — SODIUM CHLORIDE 0.9% FLUSH
10.0000 mL | INTRAVENOUS | Status: DC | PRN
  Administered 2019-11-11: 10 mL
  Filled 2019-11-11: qty 10

## 2019-11-11 MED ORDER — TRASTUZUMAB-DKST CHEMO 150 MG IV SOLR
150.0000 mg | Freq: Once | INTRAVENOUS | Status: AC
  Administered 2019-11-11: 150 mg via INTRAVENOUS
  Filled 2019-11-11: qty 7.14

## 2019-11-11 MED ORDER — SODIUM CHLORIDE 0.9 % IV SOLN
80.0000 mg/m2 | Freq: Once | INTRAVENOUS | Status: AC
  Administered 2019-11-11: 138 mg via INTRAVENOUS
  Filled 2019-11-11: qty 23

## 2019-11-11 MED ORDER — FAMOTIDINE IN NACL 20-0.9 MG/50ML-% IV SOLN
20.0000 mg | Freq: Once | INTRAVENOUS | Status: AC
  Administered 2019-11-11: 20 mg via INTRAVENOUS

## 2019-11-11 MED ORDER — SODIUM CHLORIDE 0.9 % IV SOLN
Freq: Once | INTRAVENOUS | Status: AC
  Filled 2019-11-11: qty 250

## 2019-11-11 MED ORDER — SODIUM CHLORIDE 0.9% FLUSH
10.0000 mL | Freq: Once | INTRAVENOUS | Status: AC
  Administered 2019-11-11: 10 mL
  Filled 2019-11-11: qty 10

## 2019-11-11 MED ORDER — DIPHENHYDRAMINE HCL 50 MG/ML IJ SOLN
INTRAMUSCULAR | Status: AC
  Filled 2019-11-11: qty 1

## 2019-11-11 MED ORDER — DIPHENHYDRAMINE HCL 50 MG/ML IJ SOLN
25.0000 mg | Freq: Once | INTRAMUSCULAR | Status: AC
  Administered 2019-11-11: 25 mg via INTRAVENOUS

## 2019-11-11 NOTE — Patient Instructions (Signed)
[place] Discharge Instructions for Patients Receiving Chemotherapy  Today you received the following chemotherapy agents: Trastuzumab and Paclitaxel  To help prevent nausea and vomiting after your treatment, we encourage you to take your nausea medication as prescribed.    If you develop nausea and vomiting that is not controlled by your nausea medication, call the clinic.   BELOW ARE SYMPTOMS THAT SHOULD BE REPORTED IMMEDIATELY:  *FEVER GREATER THAN 100.5 F  *CHILLS WITH OR WITHOUT FEVER  NAUSEA AND VOMITING THAT IS NOT CONTROLLED WITH YOUR NAUSEA MEDICATION  *UNUSUAL SHORTNESS OF BREATH  *UNUSUAL BRUISING OR BLEEDING  TENDERNESS IN MOUTH AND THROAT WITH OR WITHOUT PRESENCE OF ULCERS  *URINARY PROBLEMS  *BOWEL PROBLEMS  UNUSUAL RASH Items with * indicate a potential emergency and should be followed up as soon as possible.  Feel free to call the clinic should you have any questions or concerns. The clinic phone number is [phone].  Please show the [place] at check-in to the Emergency Department and triage nurse.

## 2019-11-17 NOTE — Progress Notes (Signed)
Patient Care Team: Antony Blackbird, MD as PCP - General (Family Medicine) Mauro Kaufmann, RN as Oncology Nurse Navigator Rockwell Germany, RN as Oncology Nurse Navigator Rolm Bookbinder, MD as Consulting Physician (General Surgery) Nicholas Lose, MD as Consulting Physician (Hematology and Oncology) Kyung Rudd, MD as Consulting Physician (Radiation Oncology)  DIAGNOSIS:    ICD-10-CM   1. Malignant neoplasm of upper-outer quadrant of left breast in female, estrogen receptor negative ([place])  C50.412    Z17.1     SUMMARY OF ONCOLOGIC HISTORY: Oncology History  Malignant neoplasm of upper-outer quadrant of left breast in female, estrogen receptor negative ([place])  07/27/2019 Initial Diagnosis   Screening mammogram showed a left breast asymmetry. Diagnostic mammogram and US showed a 1.2cm left breast mass, 12:30 position, no left axillary adenopathy. 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   08/05/2019 Surgery   Left lumpectomy: Grade 3 IDC, 2 cm with high-grade DCIS, lymphovascular invasion was identified, 1 lymph node negative, margins negative, ER 0%, PR 0%, HER-2 positive, Ki-67 90%   08/05/2019 Cancer Staging   Staging form: Breast, AJCC 8th Edition - Pathologic stage from 08/05/2019: Stage IIA (pT2, pN0, cM0, G3, ER-, PR-, HER2+) - Signed by Gardenia Phlegm, NP on 08/18/2019   08/30/2019 -  Chemotherapy   The patient had PACLitaxel (TAXOL) 138 mg in sodium chloride 0.9 % 250 mL chemo infusion (</= 56m/m2), 80 mg/m2 = 138 mg, Intravenous,  Once, 3 of 3 cycles Administration: 138 mg (08/30/2019), 138 mg (09/06/2019), 138 mg (09/30/2019), 138 mg (09/13/2019), 138 mg (09/22/2019), 138 mg (10/07/2019), 138 mg (10/15/2019), 138 mg (10/21/2019), 138 mg (10/28/2019), 138 mg (11/04/2019), 138 mg (11/11/2019) trastuzumab-dkst (OGIVRI) 273 mg in sodium chloride 0.9 % 250 mL chemo infusion, 4 mg/kg = 273 mg, Intravenous,  Once, 3 of 16 cycles Dose modification: 6 mg/kg  (original dose 2 mg/kg, Cycle 3, Reason: Other (see comments), Comment: begin maintenance trastuzumab) Administration: 273 mg (08/30/2019), 150 mg (09/06/2019), 150 mg (09/30/2019), 150 mg (09/13/2019), 150 mg (09/22/2019), 150 mg (10/07/2019), 150 mg (10/15/2019), 150 mg (10/21/2019), 150 mg (10/28/2019), 150 mg (11/04/2019), 150 mg (11/11/2019)  for chemotherapy treatment.      CHIEF COMPLIANT: Cycle12Taxol Herceptin  INTERVAL HISTORY: [name] a 54y.o. with above-mentioned history of left breast cancerwhounderwent a lumpectomyand is currently on adjuvant chemotherapy with Taxol Herceptin.She is a participant in the neuropathy research study.She presents to the clinic todayfora toxicity check and cycle12.  She is tolerating the treatment fairly well with exception of her diarrhea for which she does not want to take any medication.  ALLERGIES:  has No Known Allergies.  MEDICATIONS:  Current Outpatient Medications  Medication Sig Dispense Refill  . ibuprofen (ADVIL) 200 MG tablet Take 400-600 mg by mouth every 8 (eight) hours as needed for moderate pain.     .Marland Kitchenlidocaine-prilocaine (EMLA) cream Apply to affected area once 30 g 3  . LORazepam (ATIVAN) 0.5 MG tablet Take 1 tablet (0.5 mg total) by mouth at bedtime as needed (Nausea or vomiting). 30 tablet 1  . Multiple Vitamin (MULTIVITAMIN WITH MINERALS) TABS tablet Take 1 tablet by mouth daily. One-A-Day for Women 50+    . ondansetron (ZOFRAN) 8 MG tablet Take 1 tablet (8 mg total) by mouth 2 (two) times daily as needed (Nausea or vomiting). 30 tablet 1  . prochlorperazine (COMPAZINE) 10 MG tablet TAKE 1 TABLET(10 MG) BY MOUTH EVERY 6 HOURS AS NEEDED FOR NAUSEA OR  VOMITING 30 tablet 1   No current facility-administered medications for this visit.    PHYSICAL EXAMINATION: ECOG PERFORMANCE STATUS: 1 - Symptomatic but completely ambulatory  Vitals:   11/18/19 1017  BP: (!) 134/62  Pulse: 91  Resp: 18  Temp: 99.8 F (37.7 C)    SpO2: 99%    LABORATORY DATA:  I have reviewed the data as listed CMP Latest Ref Rng & Units 11/11/2019 11/04/2019 10/28/2019  Glucose 70 - 99 mg/dL 95 101(H) 98  BUN 6 - 20 mg/dL 15 15 18   Creatinine 0.44 - 1.00 mg/dL 0.79 0.76 0.79  Sodium 135 - 145 mmol/L 141 140 140  Potassium 3.5 - 5.1 mmol/L 4.3 4.1 4.1  Chloride 98 - 111 mmol/L 109 109 107  CO2 22 - 32 mmol/L 24 22 22   Calcium 8.9 - 10.3 mg/dL 9.0 8.2(L) 8.7(L)  Total Protein 6.5 - 8.1 g/dL 6.7 6.4(L) 6.8  Total Bilirubin 0.3 - 1.2 mg/dL 0.3 0.3 0.3  Alkaline Phos 38 - 126 U/L 91 82 97  AST 15 - 41 U/L 22 19 19   ALT 0 - 44 U/L 26 24 29     Lab Results  Component Value Date   WBC 4.2 11/18/2019   HGB 12.4 11/18/2019   HCT 37.0 11/18/2019   MCV 97.1 11/18/2019   PLT 289 11/18/2019   NEUTROABS 1.8 11/18/2019    ASSESSMENT & PLAN:  Malignant neoplasm of upper-outer quadrant of left breast in female, estrogen receptor negative ([place]) 07/27/2019:Screening mammogram showed a left breast asymmetry. Diagnostic mammogram and US showed a 1.2cm left breast mass, 12:30 position, no left axillary adenopathy. 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 T1c N0 stage Ia clinical stage  Recommendation: 1.08/05/2019:Left lumpectomy: Grade 3 IDC, 2 cm with high-grade DCIS, lymphovascular invasion was identified, 1 lymph node negative, margins negative, ER 0%, PR 0%, HER-2 positive, Ki-67 90% 2.Adjuvant chemotherapy with Taxol Herceptin followed by Herceptin maintenance for 1 yearstarted 08/30/2019 3.Adjuvant radiation therapy Patient is participating inSWOG S 1714neuropathy clinical trial --------------------------------------------------------------------------------------------------------------------------------------------------------------- Current treatment: Cycle10Taxol Herceptin Echocardiogram: 08/02/2019: EF 60 to 65%  Chemo toxicities: Denies neuropathy. She gets diarrhea after treatment  for 1 to 2 days probably resulting from Herceptin.  Monitoring closely for toxicities This will complete her chemotherapy today. She will receive every 3-week Herceptin treatments from 12/09/2019.  She has appointments with radiation oncology.  She stays active and exercises regularly.  Return to clinic every 3 weeks for Herceptin every 6 weeks for labs and follow-up with me.    No orders of the defined types were placed in this encounter.  The patient has a good understanding of the overall plan. she agrees with it. she will call with any problems that may develop before the next visit here.  Total time spent: 30 mins including face to face time and time spent for planning, charting and coordination of care  Nicholas Lose, MD 11/18/2019  I, Cloyde Reams Dorshimer, am acting as scribe for Dr. Nicholas Lose.  I have reviewed the above documentation for accuracy and completeness, and I agree with the above.

## 2019-11-18 ENCOUNTER — Other Ambulatory Visit: Payer: Self-pay

## 2019-11-18 ENCOUNTER — Inpatient Hospital Stay: Payer: BC Managed Care – PPO

## 2019-11-18 ENCOUNTER — Encounter: Payer: Self-pay | Admitting: *Deleted

## 2019-11-18 ENCOUNTER — Inpatient Hospital Stay: Payer: BC Managed Care – PPO | Admitting: Hematology and Oncology

## 2019-11-18 ENCOUNTER — Other Ambulatory Visit: Payer: Self-pay | Admitting: Hematology and Oncology

## 2019-11-18 DIAGNOSIS — C50412 Malignant neoplasm of upper-outer quadrant of left female breast: Secondary | ICD-10-CM

## 2019-11-18 DIAGNOSIS — Z171 Estrogen receptor negative status [ER-]: Secondary | ICD-10-CM

## 2019-11-18 DIAGNOSIS — Z95828 Presence of other vascular implants and grafts: Secondary | ICD-10-CM

## 2019-11-18 DIAGNOSIS — Z5112 Encounter for antineoplastic immunotherapy: Secondary | ICD-10-CM | POA: Diagnosis not present

## 2019-11-18 LAB — CBC WITH DIFFERENTIAL (CANCER CENTER ONLY)
Abs Immature Granulocytes: 0.05 10*3/uL (ref 0.00–0.07)
Basophils Absolute: 0 10*3/uL (ref 0.0–0.1)
Basophils Relative: 1 %
Eosinophils Absolute: 0.1 10*3/uL (ref 0.0–0.5)
Eosinophils Relative: 1 %
HCT: 37 % (ref 36.0–46.0)
Hemoglobin: 12.4 g/dL (ref 12.0–15.0)
Immature Granulocytes: 1 %
Lymphocytes Relative: 48 %
Lymphs Abs: 2 10*3/uL (ref 0.7–4.0)
MCH: 32.5 pg (ref 26.0–34.0)
MCHC: 33.5 g/dL (ref 30.0–36.0)
MCV: 97.1 fL (ref 80.0–100.0)
Monocytes Absolute: 0.3 10*3/uL (ref 0.1–1.0)
Monocytes Relative: 7 %
Neutro Abs: 1.8 10*3/uL (ref 1.7–7.7)
Neutrophils Relative %: 42 %
Platelet Count: 289 10*3/uL (ref 150–400)
RBC: 3.81 MIL/uL — ABNORMAL LOW (ref 3.87–5.11)
RDW: 15.3 % (ref 11.5–15.5)
WBC Count: 4.2 10*3/uL (ref 4.0–10.5)
nRBC: 0 % (ref 0.0–0.2)

## 2019-11-18 LAB — CMP (CANCER CENTER ONLY)
ALT: 24 U/L (ref 0–44)
AST: 19 U/L (ref 15–41)
Albumin: 3.8 g/dL (ref 3.5–5.0)
Alkaline Phosphatase: 92 U/L (ref 38–126)
Anion gap: 9 (ref 5–15)
BUN: 13 mg/dL (ref 6–20)
CO2: 23 mmol/L (ref 22–32)
Calcium: 9.1 mg/dL (ref 8.9–10.3)
Chloride: 109 mmol/L (ref 98–111)
Creatinine: 0.8 mg/dL (ref 0.44–1.00)
GFR, Est AFR Am: >60 mL/min (ref >60)
GFR, Estimated: >60 mL/min (ref >60)
Glucose, Bld: 97 mg/dL (ref 70–99)
Potassium: 4.2 mmol/L (ref 3.5–5.1)
Sodium: 141 mmol/L (ref 135–145)
Total Bilirubin: 0.3 mg/dL (ref 0.3–1.2)
Total Protein: 6.7 g/dL (ref 6.5–8.1)

## 2019-11-18 MED ORDER — SODIUM CHLORIDE 0.9 % IV SOLN
80.0000 mg/m2 | Freq: Once | INTRAVENOUS | Status: AC
  Administered 2019-11-18: 138 mg via INTRAVENOUS
  Filled 2019-11-18: qty 23

## 2019-11-18 MED ORDER — DIPHENHYDRAMINE HCL 50 MG/ML IJ SOLN
INTRAMUSCULAR | Status: AC
  Filled 2019-11-18: qty 1

## 2019-11-18 MED ORDER — SODIUM CHLORIDE 0.9% FLUSH
10.0000 mL | Freq: Once | INTRAVENOUS | Status: AC
  Administered 2019-11-18: 10 mL
  Filled 2019-11-18: qty 10

## 2019-11-18 MED ORDER — FAMOTIDINE IN NACL 20-0.9 MG/50ML-% IV SOLN
20.0000 mg | Freq: Once | INTRAVENOUS | Status: AC
  Administered 2019-11-18: 20 mg via INTRAVENOUS

## 2019-11-18 MED ORDER — DIPHENHYDRAMINE HCL 50 MG/ML IJ SOLN
25.0000 mg | Freq: Once | INTRAMUSCULAR | Status: AC
  Administered 2019-11-18: 25 mg via INTRAVENOUS

## 2019-11-18 MED ORDER — SODIUM CHLORIDE 0.9 % IV SOLN
Freq: Once | INTRAVENOUS | Status: AC
  Filled 2019-11-18: qty 250

## 2019-11-18 MED ORDER — TRASTUZUMAB-DKST CHEMO 150 MG IV SOLR
450.0000 mg | Freq: Once | INTRAVENOUS | Status: AC
  Administered 2019-11-18: 450 mg via INTRAVENOUS
  Filled 2019-11-18: qty 21.43

## 2019-11-18 MED ORDER — HEPARIN SOD (PORK) LOCK FLUSH 100 UNIT/ML IV SOLN
500.0000 [IU] | Freq: Once | INTRAVENOUS | Status: AC | PRN
  Administered 2019-11-18: 500 [IU]
  Filled 2019-11-18: qty 5

## 2019-11-18 MED ORDER — ACETAMINOPHEN 325 MG PO TABS
ORAL_TABLET | ORAL | Status: AC
  Filled 2019-11-18: qty 2

## 2019-11-18 MED ORDER — SODIUM CHLORIDE 0.9% FLUSH
10.0000 mL | INTRAVENOUS | Status: DC | PRN
  Administered 2019-11-18: 10 mL
  Filled 2019-11-18: qty 10

## 2019-11-18 MED ORDER — FAMOTIDINE IN NACL 20-0.9 MG/50ML-% IV SOLN
INTRAVENOUS | Status: AC
  Filled 2019-11-18: qty 50

## 2019-11-18 MED ORDER — ACETAMINOPHEN 325 MG PO TABS
650.0000 mg | ORAL_TABLET | Freq: Once | ORAL | Status: AC
  Administered 2019-11-18: 650 mg via ORAL

## 2019-11-18 NOTE — Assessment & Plan Note (Signed)
07/27/2019:Screening mammogram showed a left breast asymmetry. Diagnostic mammogram and US showed a 1.2cm left breast mass, 12:30 position, no left axillary adenopathy. 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 T1c N0 stage Ia clinical stage  Recommendation: 1.08/05/2019:Left lumpectomy: Grade 3 IDC, 2 cm with high-grade DCIS, lymphovascular invasion was identified, 1 lymph node negative, margins negative, ER 0%, PR 0%, HER-2 positive, Ki-67 90% 2.Adjuvant chemotherapy with Taxol Herceptin followed by Herceptin maintenance for 1 yearstarted 08/30/2019 3.Adjuvant radiation therapy Patient is participating inSWOG S 1714neuropathy clinical trial --------------------------------------------------------------------------------------------------------------------------------------------------------------- Current treatment: Cycle10Taxol Herceptin Echocardiogram: 08/02/2019: EF 60 to 65%  Chemo toxicities: Denies neuropathy. She gets diarrhea after treatment for 1 to 2 days probably resulting from Herceptin.  Monitoring closely for toxicities This will complete her chemotherapy today. She has appointments with radiation oncology.  She stays active and exercises regularly.  Return to clinic after radiation is complete.

## 2019-11-18 NOTE — Patient Instructions (Signed)
Implanted Port Insertion, Care After °This sheet gives you information about how to care for yourself after your procedure. Your health care provider may also give you more specific instructions. If you have problems or questions, contact your health care provider. °What can I expect after the procedure? °After the procedure, it is common to have: °· Discomfort at the port insertion site. °· Bruising on the skin over the port. This should improve over 3-4 days. °Follow these instructions at home: °Port care °· After your port is placed, you will get a manufacturer's information card. The card has information about your port. Keep this card with you at all times. °· Take care of the port as told by your health care provider. Ask your health care provider if you or a family member can get training for taking care of the port at home. A home health care nurse may also take care of the port. °· Make sure to remember what type of port you have. °Incision care ° °  ° °· Follow instructions from your health care provider about how to take care of your port insertion site. Make sure you: °? Wash your hands with soap and water before and after you change your bandage (dressing). If soap and water are not available, use hand sanitizer. °? Change your dressing as told by your health care provider. °? Leave stitches (sutures), skin glue, or adhesive strips in place. These skin closures may need to stay in place for 2 weeks or longer. If adhesive strip edges start to loosen and curl up, you may trim the loose edges. Do not remove adhesive strips completely unless your health care provider tells you to do that. °· Check your port insertion site every day for signs of infection. Check for: °? Redness, swelling, or pain. °? Fluid or blood. °? Warmth. °? Pus or a bad smell. °Activity °· Return to your normal activities as told by your health care provider. Ask your health care provider what activities are safe for you. °· Do not  lift anything that is heavier than 10 lb (4.5 kg), or the limit that you are told, until your health care provider says that it is safe. °General instructions °· Take over-the-counter and prescription medicines only as told by your health care provider. °· Do not take baths, swim, or use a hot tub until your health care provider approves. Ask your health care provider if you may take showers. You may only be allowed to take sponge baths. °· Do not drive for 24 hours if you were given a sedative during your procedure. °· Wear a medical alert bracelet in case of an emergency. This will tell any health care providers that you have a port. °· Keep all follow-up visits as told by your health care provider. This is important. °Contact a health care provider if: °· You cannot flush your port with saline as directed, or you cannot draw blood from the port. °· You have a fever or chills. °· You have redness, swelling, or pain around your port insertion site. °· You have fluid or blood coming from your port insertion site. °· Your port insertion site feels warm to the touch. °· You have pus or a bad smell coming from the port insertion site. °Get help right away if: °· You have chest pain or shortness of breath. °· You have bleeding from your port that you cannot control. °Summary °· Take care of the port as told by your health   care provider. Keep the manufacturer's information card with you at all times. °· Change your dressing as told by your health care provider. °· Contact a health care provider if you have a fever or chills or if you have redness, swelling, or pain around your port insertion site. °· Keep all follow-up visits as told by your health care provider. °This information is not intended to replace advice given to you by your health care provider. Make sure you discuss any questions you have with your health care provider. °Document Revised: 11/04/2017 Document Reviewed: 11/04/2017 °Elsevier Patient Education ©  2020 [employer] Inc. ° °

## 2019-11-18 NOTE — Patient Instructions (Signed)
[facility] Discharge Instructions for Patients Receiving Chemotherapy  Today you received the following chemotherapy agents: Trastuzumab and Paclitaxel  To help prevent nausea and vomiting after your treatment, we encourage you to take your nausea medication as prescribed.    If you develop nausea and vomiting that is not controlled by your nausea medication, call the clinic.   BELOW ARE SYMPTOMS THAT SHOULD BE REPORTED IMMEDIATELY:  *FEVER GREATER THAN 100.5 F  *CHILLS WITH OR WITHOUT FEVER  NAUSEA AND VOMITING THAT IS NOT CONTROLLED WITH YOUR NAUSEA MEDICATION  *UNUSUAL SHORTNESS OF BREATH  *UNUSUAL BRUISING OR BLEEDING  TENDERNESS IN MOUTH AND THROAT WITH OR WITHOUT PRESENCE OF ULCERS  *URINARY PROBLEMS  *BOWEL PROBLEMS  UNUSUAL RASH Items with * indicate a potential emergency and should be followed up as soon as possible.  Feel free to call the clinic should you have any questions or concerns. The clinic phone number is [phone].  Please show the [facility] at check-in to the Emergency Department and triage nurse.

## 2019-11-19 ENCOUNTER — Telehealth: Payer: Self-pay | Admitting: Hematology and Oncology

## 2019-11-19 NOTE — Telephone Encounter (Signed)
Scheduled appts per 7/29 los. Pt confirmed next appt date and time.

## 2019-11-22 ENCOUNTER — Other Ambulatory Visit (HOSPITAL_COMMUNITY): Payer: BC Managed Care – PPO

## 2019-11-26 ENCOUNTER — Ambulatory Visit (HOSPITAL_COMMUNITY)
Admission: RE | Admit: 2019-11-26 | Discharge: 2019-11-26 | Disposition: A | Discharge status: Home / Self Care | Payer: BC Managed Care – PPO | Source: Ambulatory Visit | Attending: Adult Health | Admitting: Adult Health

## 2019-11-26 ENCOUNTER — Other Ambulatory Visit: Payer: Self-pay

## 2019-11-26 DIAGNOSIS — Z01818 Encounter for other preprocedural examination: Secondary | ICD-10-CM | POA: Insufficient documentation

## 2019-11-26 DIAGNOSIS — Z171 Estrogen receptor negative status [ER-]: Secondary | ICD-10-CM | POA: Diagnosis not present

## 2019-11-26 DIAGNOSIS — C50412 Malignant neoplasm of upper-outer quadrant of left female breast: Secondary | ICD-10-CM | POA: Diagnosis not present

## 2019-11-26 LAB — ECHOCARDIOGRAM COMPLETE
Area-P 1/2: 3.12 cm2
S' Lateral: 2.6 cm

## 2019-11-26 NOTE — Progress Notes (Signed)
  Echocardiogram 2D Echocardiogram has been performed.  [name] 11/26/2019, 10:59 AM

## 2019-12-07 ENCOUNTER — Ambulatory Visit: Payer: BC Managed Care – PPO

## 2019-12-07 ENCOUNTER — Ambulatory Visit: Payer: BC Managed Care – PPO | Admitting: Radiation Oncology

## 2019-12-07 ENCOUNTER — Telehealth: Payer: Self-pay | Admitting: Medical Oncology

## 2019-12-07 NOTE — Telephone Encounter (Signed)
[name], A PROSPECTIVE OBSERVATIONAL COHORT STUDY TO DEVELOP A PREDICTIVE MODEL OFTAXANE-INDUCED PERIPHERAL NEUROPATHY IN CANCER PATIENTS.  Phone call to patient to schedule her for her 12 week study assessments. Patient scheduled for clinic visit, this week on the 19th. I spoke with patient about completing the 12 week assessments at that time. Patient states that she is currently sick and waiting on results from COVID-19 testing. Patient states that she will probably not be making her appointments for this day. I gave patient my understanding and informed her that I will keep an eye on her schedule to see when she will be in the clinic next time. I encouraged patient to call me with questions and thanked her for her time.  Maxwell Marion, RN, BSN, [facility] Clinical Research 12/07/2019 2:16 PM

## 2019-12-08 ENCOUNTER — Encounter: Payer: Self-pay | Admitting: *Deleted

## 2019-12-09 ENCOUNTER — Inpatient Hospital Stay: Payer: BC Managed Care – PPO

## 2019-12-16 ENCOUNTER — Ambulatory Visit: Payer: BC Managed Care – PPO

## 2019-12-16 ENCOUNTER — Ambulatory Visit: Payer: BC Managed Care – PPO | Admitting: Radiation Oncology

## 2019-12-22 ENCOUNTER — Encounter: Payer: Self-pay | Admitting: *Deleted

## 2019-12-29 ENCOUNTER — Telehealth: Payer: Self-pay | Admitting: Medical Oncology

## 2019-12-29 NOTE — Progress Notes (Signed)
Patient Care Team: Antony Blackbird, MD as PCP - General (Family Medicine) Mauro Kaufmann, RN as Oncology Nurse Navigator Rockwell Germany, RN as Oncology Nurse Navigator Rolm Bookbinder, MD as Consulting Physician (General Surgery) Nicholas Lose, MD as Consulting Physician (Hematology and Oncology) Kyung Rudd, MD as Consulting Physician (Radiation Oncology)  DIAGNOSIS:    ICD-10-CM   1. Malignant neoplasm of upper-outer quadrant of left breast in female, estrogen receptor negative ([place])  C50.412    Z17.1     SUMMARY OF ONCOLOGIC HISTORY: Oncology History  Malignant neoplasm of upper-outer quadrant of left breast in female, estrogen receptor negative ([place])  07/27/2019 Initial Diagnosis   Screening mammogram showed a left breast asymmetry. Diagnostic mammogram and US showed a 1.2cm left breast mass, 12:30 position, no left axillary adenopathy. 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   08/05/2019 Surgery   Left lumpectomy: Grade 3 IDC, 2 cm with high-grade DCIS, lymphovascular invasion was identified, 1 lymph node negative, margins negative, ER 0%, PR 0%, HER-2 positive, Ki-67 90%   08/05/2019 Cancer Staging   Staging form: Breast, AJCC 8th Edition - Pathologic stage from 08/05/2019: Stage IIA (pT2, pN0, cM0, G3, ER-, PR-, HER2+) - Signed by Gardenia Phlegm, NP on 08/18/2019   08/30/2019 -  Chemotherapy   The patient had PACLitaxel (TAXOL) 138 mg in sodium chloride 0.9 % 250 mL chemo infusion (</= 54m/m2), 80 mg/m2 = 138 mg, Intravenous,  Once, 3 of 3 cycles Administration: 138 mg (08/30/2019), 138 mg (09/06/2019), 138 mg (09/30/2019), 138 mg (09/13/2019), 138 mg (09/22/2019), 138 mg (10/07/2019), 138 mg (10/15/2019), 138 mg (10/21/2019), 138 mg (10/28/2019), 138 mg (11/04/2019), 138 mg (11/11/2019), 138 mg (11/18/2019) trastuzumab-dkst (OGIVRI) 273 mg in sodium chloride 0.9 % 250 mL chemo infusion, 4 mg/kg = 273 mg, Intravenous,  Once, 4 of 16 cycles Dose  modification: 6 mg/kg (original dose 2 mg/kg, Cycle 3, Reason: Other (see comments), Comment: begin maintenance trastuzumab) Administration: 273 mg (08/30/2019), 150 mg (09/06/2019), 150 mg (09/30/2019), 150 mg (09/13/2019), 150 mg (09/22/2019), 150 mg (10/07/2019), 150 mg (10/15/2019), 150 mg (10/21/2019), 150 mg (10/28/2019), 150 mg (11/04/2019), 150 mg (11/11/2019), 450 mg (11/18/2019)  for chemotherapy treatment.      CHIEF COMPLIANT: Follow-up for Herceptin maintenance  INTERVAL HISTORY: Sherri SINGLEis a 54y.o. with above-mentioned history of left breast cancerwhounderwent a lumpectomy, adjuvant chemotherapy, and is currently on adjuvant Herceptin maintenance.She is a participant in the neuropathy research study.She presents to the clinic todayfor treatment.  Her energy levels are improving slowly with time. She was diagnosed with Covid a few weeks ago but then fully recovered from it.  ALLERGIES:  is allergic to tape.  MEDICATIONS:  Current Outpatient Medications  Medication Sig Dispense Refill  . ibuprofen (ADVIL) 200 MG tablet Take 400-600 mg by mouth every 8 (eight) hours as needed for moderate pain.  (Patient not taking: Reported on 12/30/2019)    . lidocaine-prilocaine (EMLA) cream Apply to affected area once (Patient not taking: Reported on 12/30/2019) 30 g 3  . LORazepam (ATIVAN) 0.5 MG tablet Take 1 tablet (0.5 mg total) by mouth at bedtime as needed (Nausea or vomiting). (Patient not taking: Reported on 12/30/2019) 30 tablet 1  . Multiple Vitamin (MULTIVITAMIN WITH MINERALS) TABS tablet Take 1 tablet by mouth daily. One-A-Day for Women 50+ (Patient not taking: Reported on 12/30/2019)    . ondansetron (ZOFRAN) 8 MG tablet Take 1 tablet (8 mg total) by mouth 2 (two)  times daily as needed (Nausea or vomiting). (Patient not taking: Reported on 12/30/2019) 30 tablet 1  . prochlorperazine (COMPAZINE) 10 MG tablet TAKE 1 TABLET(10 MG) BY MOUTH EVERY 6 HOURS AS NEEDED FOR NAUSEA OR VOMITING (Patient not  taking: Reported on 12/30/2019) 30 tablet 1   No current facility-administered medications for this visit.    PHYSICAL EXAMINATION: ECOG PERFORMANCE STATUS: 1 - Symptomatic but completely ambulatory  Vitals:   12/30/19 1447  BP: 125/79  Pulse: 85  Resp: 18  Temp: 97.6 F (36.4 C)  SpO2: 99%   Filed Weights   12/30/19 1447  Weight: 157 lb 3.2 oz (71.3 kg)    LABORATORY DATA:  I have reviewed the data as listed CMP Latest Ref Rng & Units 11/18/2019 11/11/2019 11/04/2019  Glucose 70 - 99 mg/dL 97 95 101(H)  BUN 6 - 20 mg/dL 13 15 15   Creatinine 0.44 - 1.00 mg/dL 0.80 0.79 0.76  Sodium 135 - 145 mmol/L 141 141 140  Potassium 3.5 - 5.1 mmol/L 4.2 4.3 4.1  Chloride 98 - 111 mmol/L 109 109 109  CO2 22 - 32 mmol/L 23 24 22   Calcium 8.9 - 10.3 mg/dL 9.1 9.0 8.2(L)  Total Protein 6.5 - 8.1 g/dL 6.7 6.7 6.4(L)  Total Bilirubin 0.3 - 1.2 mg/dL 0.3 0.3 0.3  Alkaline Phos 38 - 126 U/L 92 91 82  AST 15 - 41 U/L 19 22 19   ALT 0 - 44 U/L 24 26 24     Lab Results  Component Value Date   WBC 6.2 12/30/2019   HGB 13.3 12/30/2019   HCT 40.3 12/30/2019   MCV 98.3 12/30/2019   PLT 250 12/30/2019   NEUTROABS 3.3 12/30/2019    ASSESSMENT & PLAN:  Malignant neoplasm of upper-outer quadrant of left breast in female, estrogen receptor negative ([place]) 07/27/2019:Screening mammogram showed a left breast asymmetry. Diagnostic mammogram and US showed a 1.2cm left breast mass, 12:30 position, no left axillary adenopathy. 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 T1c N0 stage Ia clinical stage  Recommendation: 1.08/05/2019:Left lumpectomy: Grade 3 IDC, 2 cm with high-grade DCIS, lymphovascular invasion was identified, 1 lymph node negative, margins negative, ER 0%, PR 0%, HER-2 positive, Ki-67 90% 2.Adjuvant chemotherapy with Taxol Herceptin followed by Herceptin maintenance for 1 yearstarted 08/30/2019 completed 11/18/2019 3.Adjuvant radiation therapy Patient is  participating inSWOG S 1714neuropathy clinical trial ------------------------------------------------------------------------------------------------------------------------------------------------------ current treatment: Herceptin maintenance every 3 weeks Patient has appointments to see radiation oncology. Return to clinic every 3 weeks for Herceptin every 6 weeks to follow-up with me and labs    No orders of the defined types were placed in this encounter.  The patient has a good understanding of the overall plan. she agrees with it. she will call with any problems that may develop before the next visit here.  Total time spent: 30 mins including face to face time and time spent for planning, charting and coordination of care  Nicholas Lose, MD 12/30/2019  I, Cloyde Reams Dorshimer, am acting as scribe for Dr. Nicholas Lose.  I have reviewed the above documentation for accuracy and completeness, and I agree with the above.

## 2019-12-29 NOTE — Telephone Encounter (Signed)
[id number] Confirmed appointment with patient, for tomorrow. Patient informed we will collect her 12 week study assessments, after her appointment with Dr. Lindi Adie. Patient gave verbal understanding and denies having any questions. Patient thanked for her time.  Maxwell Marion, RN, BSN, [facility] Clinical Research 12/29/2019 3:40 PM

## 2019-12-30 ENCOUNTER — Ambulatory Visit
Admission: RE | Admit: 2019-12-30 | Discharge: 2019-12-30 | Disposition: A | Discharge status: Home / Self Care | Payer: BC Managed Care – PPO | Source: Ambulatory Visit | Attending: Radiation Oncology | Admitting: Radiation Oncology

## 2019-12-30 ENCOUNTER — Encounter: Payer: Self-pay | Admitting: Radiation Oncology

## 2019-12-30 ENCOUNTER — Inpatient Hospital Stay: Payer: BC Managed Care – PPO | Attending: Hematology and Oncology

## 2019-12-30 ENCOUNTER — Inpatient Hospital Stay: Payer: BC Managed Care – PPO

## 2019-12-30 ENCOUNTER — Other Ambulatory Visit: Payer: Self-pay

## 2019-12-30 ENCOUNTER — Inpatient Hospital Stay (HOSPITAL_BASED_OUTPATIENT_CLINIC_OR_DEPARTMENT_OTHER): Payer: BC Managed Care – PPO | Admitting: Hematology and Oncology

## 2019-12-30 ENCOUNTER — Encounter: Payer: Self-pay | Admitting: Medical Oncology

## 2019-12-30 VITALS — BP 131/78 | HR 91 | Temp 97.8°F | Resp 18 | Ht 60.0 in | Wt 156.4 lb

## 2019-12-30 DIAGNOSIS — Z5112 Encounter for antineoplastic immunotherapy: Secondary | ICD-10-CM | POA: Diagnosis not present

## 2019-12-30 DIAGNOSIS — Z510 Encounter for antineoplastic radiation therapy: Secondary | ICD-10-CM | POA: Insufficient documentation

## 2019-12-30 DIAGNOSIS — C50412 Malignant neoplasm of upper-outer quadrant of left female breast: Secondary | ICD-10-CM | POA: Insufficient documentation

## 2019-12-30 DIAGNOSIS — Z171 Estrogen receptor negative status [ER-]: Secondary | ICD-10-CM | POA: Diagnosis not present

## 2019-12-30 DIAGNOSIS — Z79899 Other long term (current) drug therapy: Secondary | ICD-10-CM | POA: Insufficient documentation

## 2019-12-30 DIAGNOSIS — Z9221 Personal history of antineoplastic chemotherapy: Secondary | ICD-10-CM | POA: Insufficient documentation

## 2019-12-30 DIAGNOSIS — Z8616 Personal history of COVID-19: Secondary | ICD-10-CM | POA: Insufficient documentation

## 2019-12-30 DIAGNOSIS — M129 Arthropathy, unspecified: Secondary | ICD-10-CM | POA: Insufficient documentation

## 2019-12-30 DIAGNOSIS — Z809 Family history of malignant neoplasm, unspecified: Secondary | ICD-10-CM | POA: Diagnosis not present

## 2019-12-30 DIAGNOSIS — Z95828 Presence of other vascular implants and grafts: Secondary | ICD-10-CM

## 2019-12-30 LAB — CBC WITH DIFFERENTIAL (CANCER CENTER ONLY)
Abs Immature Granulocytes: 0.02 10*3/uL (ref 0.00–0.07)
Basophils Absolute: 0 10*3/uL (ref 0.0–0.1)
Basophils Relative: 0 %
Eosinophils Absolute: 0.1 10*3/uL (ref 0.0–0.5)
Eosinophils Relative: 2 %
HCT: 40.3 % (ref 36.0–46.0)
Hemoglobin: 13.3 g/dL (ref 12.0–15.0)
Immature Granulocytes: 0 %
Lymphocytes Relative: 36 %
Lymphs Abs: 2.2 10*3/uL (ref 0.7–4.0)
MCH: 32.4 pg (ref 26.0–34.0)
MCHC: 33 g/dL (ref 30.0–36.0)
MCV: 98.3 fL (ref 80.0–100.0)
Monocytes Absolute: 0.5 10*3/uL (ref 0.1–1.0)
Monocytes Relative: 8 %
Neutro Abs: 3.3 10*3/uL (ref 1.7–7.7)
Neutrophils Relative %: 54 %
Platelet Count: 250 10*3/uL (ref 150–400)
RBC: 4.1 MIL/uL (ref 3.87–5.11)
RDW: 12.8 % (ref 11.5–15.5)
WBC Count: 6.2 10*3/uL (ref 4.0–10.5)
nRBC: 0 % (ref 0.0–0.2)

## 2019-12-30 LAB — CMP (CANCER CENTER ONLY)
ALT: 16 U/L (ref 0–44)
AST: 17 U/L (ref 15–41)
Albumin: 3.7 g/dL (ref 3.5–5.0)
Alkaline Phosphatase: 94 U/L (ref 38–126)
Anion gap: 9 (ref 5–15)
BUN: 19 mg/dL (ref 6–20)
CO2: 25 mmol/L (ref 22–32)
Calcium: 8.8 mg/dL — ABNORMAL LOW (ref 8.9–10.3)
Chloride: 108 mmol/L (ref 98–111)
Creatinine: 0.92 mg/dL (ref 0.44–1.00)
GFR, Est AFR Am: >60 mL/min (ref >60)
GFR, Estimated: >60 mL/min (ref >60)
Glucose, Bld: 97 mg/dL (ref 70–99)
Potassium: 3.9 mmol/L (ref 3.5–5.1)
Sodium: 142 mmol/L (ref 135–145)
Total Bilirubin: <0.2 mg/dL — ABNORMAL LOW (ref 0.3–1.2)
Total Protein: 6.9 g/dL (ref 6.5–8.1)

## 2019-12-30 MED ORDER — DIPHENHYDRAMINE HCL 25 MG PO CAPS
50.0000 mg | ORAL_CAPSULE | Freq: Once | ORAL | Status: AC
  Administered 2019-12-30: 50 mg via ORAL

## 2019-12-30 MED ORDER — SODIUM CHLORIDE 0.9 % IV SOLN
Freq: Once | INTRAVENOUS | Status: AC
  Filled 2019-12-30: qty 250

## 2019-12-30 MED ORDER — ACETAMINOPHEN 325 MG PO TABS
650.0000 mg | ORAL_TABLET | Freq: Once | ORAL | Status: AC
  Administered 2019-12-30: 650 mg via ORAL

## 2019-12-30 MED ORDER — HEPARIN SOD (PORK) LOCK FLUSH 100 UNIT/ML IV SOLN
500.0000 [IU] | Freq: Once | INTRAVENOUS | Status: AC | PRN
  Administered 2019-12-30: 500 [IU]
  Filled 2019-12-30: qty 5

## 2019-12-30 MED ORDER — DIPHENHYDRAMINE HCL 25 MG PO CAPS
ORAL_CAPSULE | ORAL | Status: AC
  Filled 2019-12-30: qty 1

## 2019-12-30 MED ORDER — TRASTUZUMAB-DKST CHEMO 150 MG IV SOLR
6.0000 mg/kg | Freq: Once | INTRAVENOUS | Status: AC
  Administered 2019-12-30: 420 mg via INTRAVENOUS
  Filled 2019-12-30: qty 20

## 2019-12-30 MED ORDER — SODIUM CHLORIDE 0.9% FLUSH
10.0000 mL | INTRAVENOUS | Status: DC | PRN
  Administered 2019-12-30: 10 mL
  Filled 2019-12-30: qty 10

## 2019-12-30 MED ORDER — ACETAMINOPHEN 325 MG PO TABS
ORAL_TABLET | ORAL | Status: AC
  Filled 2019-12-30: qty 2

## 2019-12-30 MED ORDER — SODIUM CHLORIDE 0.9% FLUSH
10.0000 mL | Freq: Once | INTRAVENOUS | Status: AC
  Administered 2019-12-30: 10 mL
  Filled 2019-12-30: qty 10

## 2019-12-30 NOTE — Addendum Note (Signed)
Addended by: Nicholas Lose on: 12/30/2019 03:14 PM   Modules accepted: Orders

## 2019-12-30 NOTE — Patient Instructions (Signed)

## 2019-12-30 NOTE — Assessment & Plan Note (Signed)
07/27/2019:Screening mammogram showed a left breast asymmetry. Diagnostic mammogram and US showed a 1.2cm left breast mass, 12:30 position, no left axillary adenopathy. 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 T1c N0 stage Ia clinical stage  Recommendation: 1.08/05/2019:Left lumpectomy: Grade 3 IDC, 2 cm with high-grade DCIS, lymphovascular invasion was identified, 1 lymph node negative, margins negative, ER 0%, PR 0%, HER-2 positive, Ki-67 90% 2.Adjuvant chemotherapy with Taxol Herceptin followed by Herceptin maintenance for 1 yearstarted 08/30/2019 completed 11/18/2019 3.Adjuvant radiation therapy Patient is participating inSWOG S 1714neuropathy clinical trial ------------------------------------------------------------------------------------------------------------------------------------------------------ current treatment: Herceptin maintenance every 3 weeks Patient has appointments to see radiation oncology. Return to clinic every 3 weeks for Herceptin every 6 weeks to follow-up with me and labs

## 2019-12-30 NOTE — Progress Notes (Signed)
Radiation Oncology         [phone] ________________________________  Name: [name]        MRN: [id number]  Date of Service: 12/30/2019 DOB: [birth year]  EX:BMWU, Ander Gaster, MD  Dr. Donne Hazel  REFERRING PHYSICIAN: Dr. Donne Hazel  DIAGNOSIS: The encounter diagnosis was Malignant neoplasm of upper-outer quadrant of left breast in female, estrogen receptor negative ([place]).   HISTORY OF PRESENT ILLNESS: [name] is a 54 y.o. female originally seen in the multidisciplinary breast clinic for a new diagnosis of left breast cancer. The patient was noted to have a screening detected abnormality in the left breast, and a possible asymmetry, and no abnormal findings in the right breast on recent mammography.  Diagnostic mammogram revealed a persistence of a 1.4 cm mass in the upper outer quadrant of the left breast.  Targeted ultrasound revealed a mass at 1232 cm from the nipple measuring 1.2 x 0.8 x 1.1 cm.  Her axilla was negative for adenopathy.  She underwent a biopsy on 07/22/2018 one of the mass at 1230 o'clock in the left breast revealing an invasive ductal carcinoma, at least grade 2, and her tumor was ER/PR negative, HER-2 amplified with a Ki-67 of 90%.  She subsequently went on to proceed with lumpectomy to the left breast with sentinel lymph node biopsy on 08/05/2019 with Dr. Garnette Czech.  Final pathology revealed a grade 3 invasive ductal carcinoma of the left breast spanning 2 cm with associated high-grade DCIS.  Lymphovascular space involvement was noted, she had a single lymph node in the left axilla that was sampled and was negative for disease.  Her final surgical margins were all negative for carcinoma, and DCIS was 1.3 mm to the posterior margin.  She proceeded with systemic adjuvant chemotherapy beginning on 08/30/2019, she completed her chemotherapy on November 18, 2019 and continues HER-2 targeted therapy.  She is seen today to discuss options of adjuvant radiotherapy.  Of note her treatment  discussion was delayed due to developing Covid.  Unfortunately the patient had been fully vaccinated and was felt to have developed this due to her immunocompromise state from chemotherapy.      PREVIOUS RADIATION THERAPY: No   PAST MEDICAL HISTORY:  Past Medical History:  Diagnosis Date  . Arthritis   . Cancer ([place])    Left breast  . Headache    migraine        PAST SURGICAL HISTORY: Past Surgical History:  Procedure Laterality Date  . ARTERY BIOPSY Right 11/11/2018   Procedure: BIOPSY TEMPORAL ARTERY;  Surgeon: Rosetta Posner, MD;  Location: [facility];  Service: Vascular;  Laterality: Right;  . BREAST LUMPECTOMY WITH RADIOACTIVE SEED AND SENTINEL LYMPH NODE BIOPSY Left 08/05/2019   Procedure: LEFT BREAST LUMPECTOMY WITH RADIOACTIVE SEED AND LEFT AXILLARY SENTINEL LYMPH NODE BIOPSY;  Surgeon: Rolm Bookbinder, MD;  Location: [facility];  Service: General;  Laterality: Left;  PEC BLOCK  . CESAREAN SECTION     x2  . PORTACATH PLACEMENT Right 08/05/2019   Procedure: INSERTION PORT-A-CATH WITH ULTRASOUND GUIDANCE;  Surgeon: Rolm Bookbinder, MD;  Location: [facility];  Service: General;  Laterality: Right;     FAMILY HISTORY:  Family History  Problem Relation Age of Onset  . COPD Mother   . Emphysema Mother   . Cancer Father   . Heart disease Maternal Grandfather      SOCIAL HISTORY:  reports that she has never smoked. She has never used smokeless tobacco. She reports current alcohol use of  about 2.0 standard drinks of alcohol per week. She reports that she does not use drugs. The patient is married and lives in [place]. 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 She has two teenage children.   ALLERGIES: Tape   MEDICATIONS:  Current Outpatient Medications  Medication Sig Dispense Refill  . ibuprofen (ADVIL) 200 MG tablet Take 400-600 mg by mouth every 8 (eight) hours as needed for moderate pain.  (Patient not taking: Reported on 12/30/2019)    .  lidocaine-prilocaine (EMLA) cream Apply to affected area once (Patient not taking: Reported on 12/30/2019) 30 g 3  . LORazepam (ATIVAN) 0.5 MG tablet Take 1 tablet (0.5 mg total) by mouth at bedtime as needed (Nausea or vomiting). (Patient not taking: Reported on 12/30/2019) 30 tablet 1  . Multiple Vitamin (MULTIVITAMIN WITH MINERALS) TABS tablet Take 1 tablet by mouth daily. One-A-Day for Women 50+ (Patient not taking: Reported on 12/30/2019)    . ondansetron (ZOFRAN) 8 MG tablet Take 1 tablet (8 mg total) by mouth 2 (two) times daily as needed (Nausea or vomiting). (Patient not taking: Reported on 12/30/2019) 30 tablet 1  . prochlorperazine (COMPAZINE) 10 MG tablet TAKE 1 TABLET(10 MG) BY MOUTH EVERY 6 HOURS AS NEEDED FOR NAUSEA OR VOMITING (Patient not taking: Reported on 12/30/2019) 30 tablet 1   No current facility-administered medications for this encounter.     REVIEW OF SYSTEMS: On review of systems, the patient reports that she is doing well overall.  She is feeling better, but had  fatigue, and a cough while having had, and since  recovering from Covid.  She feels like she is doing better however this week.  No specific complaints were otherwise noted.     PHYSICAL EXAM:  Wt Readings from Last 3 Encounters:  12/30/19 156 lb 6.4 oz (70.9 kg)  12/30/19 156 lb 6.4 oz (70.9 kg)  11/18/19 156 lb 6.4 oz (70.9 kg)   Temp Readings from Last 3 Encounters:  12/30/19 97.8 F (36.6 C)  12/30/19 97.8 F (36.6 C)  11/18/19 99.8 F (37.7 C) (Temporal)   BP Readings from Last 3 Encounters:  12/30/19 131/78  12/30/19 131/78  11/18/19 (!) 134/62   Pulse Readings from Last 3 Encounters:  12/30/19 91  12/30/19 91  11/18/19 91    In general this is a well appearing Caucasian female in no acute distress. She's alert and oriented x4 and appropriate throughout the examination. Cardiopulmonary assessment is negative for acute distress and she exhibits normal effort.  Her left breast incision is  well-healed at this time without erythema or fullness.  Her axillary incision site is also well-healed without abnormality.    ECOG = 0  0 - Asymptomatic (Fully active, able to carry on all predisease activities without restriction)  1 - Symptomatic but completely ambulatory (Restricted in physically strenuous activity but ambulatory and able to carry out work of a light or sedentary nature. For example, light housework, office work)  2 - Symptomatic, <50% in bed during the day (Ambulatory and capable of all self care but unable to carry out any work activities. Up and about more than 50% of waking hours)  3 - Symptomatic, >50% in bed, but not bedbound (Capable of only limited self-care, confined to bed or chair 50% or more of waking hours)  4 - Bedbound (Completely disabled. Cannot carry on any self-care. Totally confined to bed or chair)  5 - Death   Lolita Rieger, Creech RH, Tormey DC, et  al. [phone]). "Toxicity and response criteria of the [facility]". [place] Oncol. 5 (6): 649-55    LABORATORY DATA:  Lab Results  Component Value Date   WBC 4.2 11/18/2019   HGB 12.4 11/18/2019   HCT 37.0 11/18/2019   MCV 97.1 11/18/2019   PLT 289 11/18/2019   Lab Results  Component Value Date   NA 141 11/18/2019   K 4.2 11/18/2019   CL 109 11/18/2019   CO2 23 11/18/2019   Lab Results  Component Value Date   ALT 24 11/18/2019   AST 19 11/18/2019   ALKPHOS 92 11/18/2019   BILITOT 0.3 11/18/2019      RADIOGRAPHY: No results found.     IMPRESSION/PLAN: 1. 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 Dr. Lisbeth Renshaw discusses the final pathology findings and reviews the patient's course. He reviews that she would benefit from adjuvant radiotherapy to reduce risks of local recurrence. We discussed the risks, benefits, short, and long term effects of radiotherapy, and the patient is interested in proceeding. Dr. Lisbeth Renshaw discusses  the delivery and logistics of radiotherapy and recommends a course of 4  weeks of radiotherapy with deep inspiration breath hold technique. Written consent is obtained and placed in the chart, a copy was provided to the patient. She will simulate this morning and proceed with treatment in the next week.   In a visit lasting 45 minutes, greater than 50% of the time was spent face to face reviewing her case, as well as in preparation of, discussing, and coordinating the patient's care.  The above documentation reflects my direct findings during this shared patient visit. Please see the separate note by Dr. Lisbeth Renshaw on this date for the remainder of the patient's plan of care.    Carola Rhine, PAC

## 2019-12-30 NOTE — Progress Notes (Signed)
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 Week 12 visit Patient presented to the clinic, alone today, for assessments and treatment. I met with patient after her lab appointment and prior to her office visit with Dr. Lindi Adie.   Patient and I reviewed her medication list, and patient confirms to not be taking anything for neuropathy. Patient denies having any new or concerning issues.  Patient confirms today that she is not experiencing any neuropathy related to chemotherapy treatments.  This visit is overdue, due to the fact that patient was diagnosed with COVID-19 a few weeks ago and we had to postpone her appointments, a deviation will be reported to the study, as required.   PROs: Questionnaires were provided to patient to complete upon arrival to clinic. Completed questionnaires were collected and checked for completeness and accuracy.  Labs: No labs for study to be collected. Patient did not consent to the optional labs.  Physician Assessments: CTCAE and Treatment Burden assessment forms completed and signed by Dr. Lindi Adie. History of Falls: Not required collection at this time point.  Assessment for Interventions for CIPN: Reviewed with patient and CRFs completed. Neuropen Assessment: Completed per protocol, by this certified research RN, with time and documentation completed by RN, Wilber Bihari.  Tuning Fork Assessment: Completed per protocol, by this Film/video editor, with time and documentation completed by RN, Wilber Bihari.  Timed Get Up and Go Test: Not required collection at this time point.  Plan: Patient informed, next study assessments for week 24, will be in November. Patient denied having any questions at this time. Patient was thanked for her time and continued contribution and support of study and she was encouraged to call me for any questions or concerns she may have prior to  her next appointment.  Maxwell Marion, RN, BSN, [facility] Clinical Research 12/30/2019 3:32 PM

## 2019-12-30 NOTE — Patient Instructions (Signed)
[facility] Discharge Instructions for Patients Receiving Chemotherapy  Today you received the following chemotherapy agents trastuzumab.  To help prevent nausea and vomiting after your treatment, we encourage you to take your nausea medication as directed.    If you develop nausea and vomiting that is not controlled by your nausea medication, call the clinic.   BELOW ARE SYMPTOMS THAT SHOULD BE REPORTED IMMEDIATELY:  *FEVER GREATER THAN 100.5 F  *CHILLS WITH OR WITHOUT FEVER  NAUSEA AND VOMITING THAT IS NOT CONTROLLED WITH YOUR NAUSEA MEDICATION  *UNUSUAL SHORTNESS OF BREATH  *UNUSUAL BRUISING OR BLEEDING  TENDERNESS IN MOUTH AND THROAT WITH OR WITHOUT PRESENCE OF ULCERS  *URINARY PROBLEMS  *BOWEL PROBLEMS  UNUSUAL RASH Items with * indicate a potential emergency and should be followed up as soon as possible.  Feel free to call the clinic should you have any questions or concerns. The clinic phone number is [phone].  Please show the CHEMO ALERT CARD at check-in to the Emergency Department and triage nurse.   

## 2019-12-30 NOTE — Progress Notes (Signed)
She presents to the clinic today for follow-up visit to discuss starting radiation therapy for Left breast cancer.  She reports feeling well with no tenderness or swelling at the incision site.  Surgery date was 08/05/2019.  She states she has recovered well from recent Covid-19 infection.  She received her Covid-19 vaccinations in March 2021.   BP 131/78   Pulse 91   Temp 97.8 F (36.6 C)   Resp 18   Ht 5' (1.524 m)   Wt 156 lb 6.4 oz (70.9 kg)   LMP 05/01/2012 (LMP Unknown)   SpO2 99%   BMI 30.54 kg/m    Wt Readings from Last 3 Encounters:  12/30/19 156 lb 6.4 oz (70.9 kg)  11/18/19 156 lb 6.4 oz (70.9 kg)  11/04/19 155 lb 6.4 oz (70.5 kg)

## 2019-12-31 ENCOUNTER — Telehealth: Payer: Self-pay | Admitting: Hematology and Oncology

## 2019-12-31 ENCOUNTER — Telehealth: Payer: Self-pay | Admitting: Medical Oncology

## 2019-12-31 DIAGNOSIS — C50412 Malignant neoplasm of upper-outer quadrant of left female breast: Secondary | ICD-10-CM | POA: Diagnosis not present

## 2019-12-31 NOTE — Telephone Encounter (Signed)
Scheduled per 9/9 los. Pt will receive an updated appt calendar, per appt notes

## 2019-12-31 NOTE — Telephone Encounter (Signed)
[id number]: follow-up call with patient regarding when she received confirmation of positive COVID-19 results.  I spoke with patient and informed her that since her week 12 visit was delayed and completed out of the study's window for visits, I was required to report the reason why visit was delayed. I inquired with patient if she recalled the date she received confirmation of positive COVID-19 test result, since this information is not in her medical record. Patient gave me her understanding and informed me that she was starting to feel ill on August 14th, had the COVID-19 test on August 16th and the test resulted and she was informed on August 19th, 2021, with a positive result. \ Patient was thanked for the information and her time. Maxwell Marion, RN, BSN, [facility] Clinical Research 12/31/2019 1:44 PM

## 2020-01-06 ENCOUNTER — Encounter: Payer: Self-pay | Admitting: *Deleted

## 2020-01-10 ENCOUNTER — Ambulatory Visit
Admission: RE | Admit: 2020-01-10 | Discharge: 2020-01-10 | Disposition: A | Discharge status: Home / Self Care | Payer: BC Managed Care – PPO | Source: Ambulatory Visit | Attending: Radiation Oncology | Admitting: Radiation Oncology

## 2020-01-10 ENCOUNTER — Other Ambulatory Visit: Payer: Self-pay

## 2020-01-10 DIAGNOSIS — C50412 Malignant neoplasm of upper-outer quadrant of left female breast: Secondary | ICD-10-CM | POA: Diagnosis not present

## 2020-01-11 ENCOUNTER — Other Ambulatory Visit: Payer: Self-pay

## 2020-01-11 ENCOUNTER — Ambulatory Visit
Admission: RE | Admit: 2020-01-11 | Discharge: 2020-01-11 | Disposition: A | Discharge status: Home / Self Care | Payer: BC Managed Care – PPO | Source: Ambulatory Visit | Attending: Radiation Oncology | Admitting: Radiation Oncology

## 2020-01-11 DIAGNOSIS — C50412 Malignant neoplasm of upper-outer quadrant of left female breast: Secondary | ICD-10-CM | POA: Diagnosis not present

## 2020-01-12 ENCOUNTER — Ambulatory Visit
Admission: RE | Admit: 2020-01-12 | Discharge: 2020-01-12 | Disposition: A | Discharge status: Home / Self Care | Payer: BC Managed Care – PPO | Source: Ambulatory Visit | Attending: Radiation Oncology | Admitting: Radiation Oncology

## 2020-01-12 ENCOUNTER — Other Ambulatory Visit: Payer: Self-pay

## 2020-01-12 DIAGNOSIS — C50412 Malignant neoplasm of upper-outer quadrant of left female breast: Secondary | ICD-10-CM | POA: Diagnosis not present

## 2020-01-13 ENCOUNTER — Ambulatory Visit
Admission: RE | Admit: 2020-01-13 | Discharge: 2020-01-13 | Disposition: A | Discharge status: Home / Self Care | Payer: BC Managed Care – PPO | Source: Ambulatory Visit | Attending: Radiation Oncology | Admitting: Radiation Oncology

## 2020-01-13 ENCOUNTER — Other Ambulatory Visit: Payer: Self-pay

## 2020-01-13 DIAGNOSIS — C50412 Malignant neoplasm of upper-outer quadrant of left female breast: Secondary | ICD-10-CM | POA: Diagnosis not present

## 2020-01-13 NOTE — Progress Notes (Signed)
Pt here for patient teaching.  Pt given Radiation and You booklet, skin care instructions, Alra deodorant and Radiaplex gel.  Reviewed areas of pertinence such as fatigue, hair loss, skin changes, breast tenderness and breast swelling . Pt able to give teach back of to pat skin and use unscented/gentle soap,apply Radiaplex bid, avoid applying anything to skin within 4 hours of treatment, avoid wearing an under wire bra and to use an electric razor if they must shave. Pt verbalizes understanding of information given and will contact nursing with any questions or concerns.     Junnie Loschiavo M. Boyd [place] RN, BSN      

## 2020-01-14 ENCOUNTER — Other Ambulatory Visit: Payer: Self-pay

## 2020-01-14 ENCOUNTER — Encounter: Payer: Self-pay | Admitting: Radiation Oncology

## 2020-01-14 ENCOUNTER — Ambulatory Visit
Admission: RE | Admit: 2020-01-14 | Discharge: 2020-01-14 | Disposition: A | Discharge status: Home / Self Care | Payer: BC Managed Care – PPO | Source: Ambulatory Visit | Attending: Radiation Oncology | Admitting: Radiation Oncology

## 2020-01-14 DIAGNOSIS — Z171 Estrogen receptor negative status [ER-]: Secondary | ICD-10-CM

## 2020-01-14 DIAGNOSIS — C50412 Malignant neoplasm of upper-outer quadrant of left female breast: Secondary | ICD-10-CM | POA: Diagnosis not present

## 2020-01-14 MED ORDER — RADIAPLEXRX EX GEL: Freq: Once | CUTANEOUS | Status: AC

## 2020-01-14 MED ORDER — ALRA NON-METALLIC DEODORANT (RAD-ONC)
1.0000 "application " | Freq: Once | TOPICAL | Status: AC
  Administered 2020-01-14: 1 via TOPICAL

## 2020-01-17 ENCOUNTER — Ambulatory Visit
Admission: RE | Admit: 2020-01-17 | Discharge: 2020-01-17 | Disposition: A | Discharge status: Home / Self Care | Payer: BC Managed Care – PPO | Source: Ambulatory Visit | Attending: Radiation Oncology | Admitting: Radiation Oncology

## 2020-01-17 ENCOUNTER — Other Ambulatory Visit: Payer: Self-pay

## 2020-01-17 DIAGNOSIS — C50412 Malignant neoplasm of upper-outer quadrant of left female breast: Secondary | ICD-10-CM | POA: Diagnosis not present

## 2020-01-18 ENCOUNTER — Ambulatory Visit
Admission: RE | Admit: 2020-01-18 | Discharge: 2020-01-18 | Disposition: A | Discharge status: Home / Self Care | Payer: BC Managed Care – PPO | Source: Ambulatory Visit | Attending: Radiation Oncology | Admitting: Radiation Oncology

## 2020-01-18 DIAGNOSIS — C50412 Malignant neoplasm of upper-outer quadrant of left female breast: Secondary | ICD-10-CM | POA: Diagnosis not present

## 2020-01-19 ENCOUNTER — Other Ambulatory Visit: Payer: Self-pay

## 2020-01-19 ENCOUNTER — Ambulatory Visit
Admission: RE | Admit: 2020-01-19 | Discharge: 2020-01-19 | Disposition: A | Discharge status: Home / Self Care | Payer: BC Managed Care – PPO | Source: Ambulatory Visit | Attending: Radiation Oncology | Admitting: Radiation Oncology

## 2020-01-19 DIAGNOSIS — C50412 Malignant neoplasm of upper-outer quadrant of left female breast: Secondary | ICD-10-CM | POA: Diagnosis not present

## 2020-01-19 NOTE — Progress Notes (Signed)
  Radiation Oncology         [phone] ________________________________  Name: [name] MRN: [id number]  Date: 01/14/2020  DOB: April 03, 1966  SIMULATION NOTE   NARRATIVE:  The patient underwent simulation today for ongoing radiation therapy.  The existing CT study set was employed for the purpose of virtual treatment planning.  The target and avoidance structures were reviewed and modified as necessary.  Treatment planning then occurred.  The radiation boost prescription was entered and confirmed.  A total of 3 complex treatment devices were fabricated in the form of multi-leaf collimators to shape radiation around the targets while maximally excluding nearby normal structures. I have requested : Isodose Plan.    PLAN:  This modified radiation beam arrangement is intended to continue the current radiation dose to an additional 8 Gy in 4 fractions for a total cumulative dose of 50.56 Gy.    ------------------------------------------------  Jodelle Gross, MD, PhD

## 2020-01-20 ENCOUNTER — Ambulatory Visit
Admission: RE | Admit: 2020-01-20 | Discharge: 2020-01-20 | Disposition: A | Discharge status: Home / Self Care | Payer: BC Managed Care – PPO | Source: Ambulatory Visit | Attending: Radiation Oncology | Admitting: Radiation Oncology

## 2020-01-20 ENCOUNTER — Other Ambulatory Visit: Payer: BC Managed Care – PPO

## 2020-01-20 ENCOUNTER — Other Ambulatory Visit: Payer: Self-pay

## 2020-01-20 ENCOUNTER — Inpatient Hospital Stay: Payer: BC Managed Care – PPO

## 2020-01-20 VITALS — BP 131/82 | HR 74 | Temp 98.4°F | Resp 16

## 2020-01-20 DIAGNOSIS — Z5112 Encounter for antineoplastic immunotherapy: Secondary | ICD-10-CM | POA: Diagnosis not present

## 2020-01-20 DIAGNOSIS — C50412 Malignant neoplasm of upper-outer quadrant of left female breast: Secondary | ICD-10-CM | POA: Diagnosis not present

## 2020-01-20 MED ORDER — SODIUM CHLORIDE 0.9% FLUSH
10.0000 mL | INTRAVENOUS | Status: DC | PRN
  Administered 2020-01-20: 10 mL
  Filled 2020-01-20: qty 10

## 2020-01-20 MED ORDER — ACETAMINOPHEN 325 MG PO TABS
ORAL_TABLET | ORAL | Status: AC
  Filled 2020-01-20: qty 2

## 2020-01-20 MED ORDER — TRASTUZUMAB-DKST CHEMO 150 MG IV SOLR
6.0000 mg/kg | Freq: Once | INTRAVENOUS | Status: AC
  Administered 2020-01-20: 420 mg via INTRAVENOUS
  Filled 2020-01-20: qty 20

## 2020-01-20 MED ORDER — DIPHENHYDRAMINE HCL 25 MG PO CAPS
50.0000 mg | ORAL_CAPSULE | Freq: Once | ORAL | Status: AC
  Administered 2020-01-20: 50 mg via ORAL

## 2020-01-20 MED ORDER — DIPHENHYDRAMINE HCL 25 MG PO CAPS
ORAL_CAPSULE | ORAL | Status: AC
  Filled 2020-01-20: qty 2

## 2020-01-20 MED ORDER — SODIUM CHLORIDE 0.9 % IV SOLN
Freq: Once | INTRAVENOUS | Status: AC
  Filled 2020-01-20: qty 250

## 2020-01-20 MED ORDER — HEPARIN SOD (PORK) LOCK FLUSH 100 UNIT/ML IV SOLN
500.0000 [IU] | Freq: Once | INTRAVENOUS | Status: AC | PRN
  Administered 2020-01-20: 500 [IU]
  Filled 2020-01-20: qty 5

## 2020-01-20 MED ORDER — ACETAMINOPHEN 325 MG PO TABS
650.0000 mg | ORAL_TABLET | Freq: Once | ORAL | Status: AC
  Administered 2020-01-20: 650 mg via ORAL

## 2020-01-20 NOTE — Patient Instructions (Signed)
[facility] Discharge Instructions for Patients Receiving Chemotherapy  Today you received the following chemotherapy agents trastuzumab.  To help prevent nausea and vomiting after your treatment, we encourage you to take your nausea medication as directed.    If you develop nausea and vomiting that is not controlled by your nausea medication, call the clinic.   BELOW ARE SYMPTOMS THAT SHOULD BE REPORTED IMMEDIATELY:  *FEVER GREATER THAN 100.5 F  *CHILLS WITH OR WITHOUT FEVER  NAUSEA AND VOMITING THAT IS NOT CONTROLLED WITH YOUR NAUSEA MEDICATION  *UNUSUAL SHORTNESS OF BREATH  *UNUSUAL BRUISING OR BLEEDING  TENDERNESS IN MOUTH AND THROAT WITH OR WITHOUT PRESENCE OF ULCERS  *URINARY PROBLEMS  *BOWEL PROBLEMS  UNUSUAL RASH Items with * indicate a potential emergency and should be followed up as soon as possible.  Feel free to call the clinic should you have any questions or concerns. The clinic phone number is [phone].  Please show the CHEMO ALERT CARD at check-in to the Emergency Department and triage nurse.   

## 2020-01-21 ENCOUNTER — Ambulatory Visit
Admission: RE | Admit: 2020-01-21 | Discharge: 2020-01-21 | Disposition: A | Discharge status: Home / Self Care | Payer: BC Managed Care – PPO | Source: Ambulatory Visit | Attending: Radiation Oncology | Admitting: Radiation Oncology

## 2020-01-21 DIAGNOSIS — Z171 Estrogen receptor negative status [ER-]: Secondary | ICD-10-CM | POA: Diagnosis not present

## 2020-01-21 DIAGNOSIS — C50412 Malignant neoplasm of upper-outer quadrant of left female breast: Secondary | ICD-10-CM | POA: Diagnosis not present

## 2020-01-21 DIAGNOSIS — Z510 Encounter for antineoplastic radiation therapy: Secondary | ICD-10-CM | POA: Diagnosis not present

## 2020-01-24 ENCOUNTER — Ambulatory Visit
Admission: RE | Admit: 2020-01-24 | Discharge: 2020-01-24 | Disposition: A | Discharge status: Home / Self Care | Payer: BC Managed Care – PPO | Source: Ambulatory Visit | Attending: Radiation Oncology | Admitting: Radiation Oncology

## 2020-01-24 DIAGNOSIS — C50412 Malignant neoplasm of upper-outer quadrant of left female breast: Secondary | ICD-10-CM | POA: Diagnosis not present

## 2020-01-25 ENCOUNTER — Other Ambulatory Visit: Payer: Self-pay

## 2020-01-25 ENCOUNTER — Ambulatory Visit
Admission: RE | Admit: 2020-01-25 | Discharge: 2020-01-25 | Disposition: A | Discharge status: Home / Self Care | Payer: BC Managed Care – PPO | Source: Ambulatory Visit | Attending: Radiation Oncology | Admitting: Radiation Oncology

## 2020-01-25 DIAGNOSIS — C50412 Malignant neoplasm of upper-outer quadrant of left female breast: Secondary | ICD-10-CM | POA: Diagnosis not present

## 2020-01-26 ENCOUNTER — Ambulatory Visit
Admission: RE | Admit: 2020-01-26 | Discharge: 2020-01-26 | Disposition: A | Discharge status: Home / Self Care | Payer: BC Managed Care – PPO | Source: Ambulatory Visit | Attending: Radiation Oncology | Admitting: Radiation Oncology

## 2020-01-26 DIAGNOSIS — C50412 Malignant neoplasm of upper-outer quadrant of left female breast: Secondary | ICD-10-CM | POA: Diagnosis not present

## 2020-01-27 ENCOUNTER — Other Ambulatory Visit: Payer: Self-pay

## 2020-01-27 ENCOUNTER — Ambulatory Visit
Admission: RE | Admit: 2020-01-27 | Discharge: 2020-01-27 | Disposition: A | Discharge status: Home / Self Care | Payer: BC Managed Care – PPO | Source: Ambulatory Visit | Attending: Radiation Oncology | Admitting: Radiation Oncology

## 2020-01-27 DIAGNOSIS — C50412 Malignant neoplasm of upper-outer quadrant of left female breast: Secondary | ICD-10-CM | POA: Diagnosis not present

## 2020-01-28 ENCOUNTER — Ambulatory Visit
Admission: RE | Admit: 2020-01-28 | Discharge: 2020-01-28 | Disposition: A | Discharge status: Home / Self Care | Payer: BC Managed Care – PPO | Source: Ambulatory Visit | Attending: Radiation Oncology | Admitting: Radiation Oncology

## 2020-01-28 ENCOUNTER — Ambulatory Visit: Payer: BC Managed Care – PPO | Admitting: Radiation Oncology

## 2020-01-28 DIAGNOSIS — C50412 Malignant neoplasm of upper-outer quadrant of left female breast: Secondary | ICD-10-CM | POA: Diagnosis not present

## 2020-01-31 ENCOUNTER — Ambulatory Visit
Admission: RE | Admit: 2020-01-31 | Discharge: 2020-01-31 | Disposition: A | Discharge status: Home / Self Care | Payer: BC Managed Care – PPO | Source: Ambulatory Visit | Attending: Radiation Oncology | Admitting: Radiation Oncology

## 2020-01-31 ENCOUNTER — Ambulatory Visit: Payer: BC Managed Care – PPO | Attending: General Surgery

## 2020-01-31 ENCOUNTER — Other Ambulatory Visit: Payer: Self-pay

## 2020-01-31 DIAGNOSIS — C50412 Malignant neoplasm of upper-outer quadrant of left female breast: Secondary | ICD-10-CM | POA: Diagnosis not present

## 2020-01-31 DIAGNOSIS — Z483 Aftercare following surgery for neoplasm: Secondary | ICD-10-CM | POA: Insufficient documentation

## 2020-01-31 NOTE — Therapy (Signed)
[place], [place], [address] Phone: [phone]   Fax:  [phone]  Physical Therapy Treatment  Patient Details  Name: [name] MRN: [id number] Date of Birth: Oct 07, 1965 Referring Provider (PT): Dr. Rolm Bookbinder   Encounter Date: 01/31/2020   PT End of Session - 01/31/20 1620    Visit Number 5   # unchanged due to screen only   Number of Visits 6    Date for PT Re-Evaluation 10/07/19    PT Start Time 2836    PT Stop Time 1620    PT Time Calculation (min) 11 min    Activity Tolerance Patient tolerated treatment well    Behavior During Therapy [facility] for tasks assessed/performed           Past Medical History:  Diagnosis Date  . Arthritis   . Cancer [facility])    Left breast  . Headache    migraine     Past Surgical History:  Procedure Laterality Date  . ARTERY BIOPSY Right 11/11/2018   Procedure: BIOPSY TEMPORAL ARTERY;  Surgeon: Rosetta Posner, MD;  Location: [place];  Service: Vascular;  Laterality: Right;  . BREAST LUMPECTOMY WITH RADIOACTIVE SEED AND SENTINEL LYMPH NODE BIOPSY Left 08/05/2019   Procedure: LEFT BREAST LUMPECTOMY WITH RADIOACTIVE SEED AND LEFT AXILLARY SENTINEL LYMPH NODE BIOPSY;  Surgeon: Rolm Bookbinder, MD;  Location: [place];  Service: General;  Laterality: Left;  PEC BLOCK  . CESAREAN SECTION     x2  . PORTACATH PLACEMENT Right 08/05/2019   Procedure: INSERTION PORT-A-CATH WITH ULTRASOUND GUIDANCE;  Surgeon: Rolm Bookbinder, MD;  Location: [place];  Service: General;  Laterality: Right;    There were no vitals filed for this visit.   Subjective Assessment - 01/31/20 1606    Subjective Pt here for 6 month L-Dex screen.    Pertinent History Patient was diagnosed on 07/12/2019 with left grade II invasive ductal carcinoma breast cancer.  It is ER/PR negative and HER2 positive with a Ki67 of 90%. Patient underwent a left lumpectomy and sentinel node biopsy (1 negative  node) on 08/05/2019.                  L-DEX FLOWSHEETS - 01/31/20 1600      L-DEX LYMPHEDEMA SCREENING   BASELINE SCORE (UNILATERAL) -2.4    L-DEX SCORE (UNILATERAL) 2.3    VALUE CHANGE (UNILAT) 4.7                                  PT Long Term Goals - 10/06/19 1023      PT LONG TERM GOAL #1   Title Patient will demonstrate she has regained full shoulder ROM and function post operatively compared to baselines.    Status Achieved      PT LONG TERM GOAL #2   Title Patient will increase left shoulder active flexion to >/= 140 degrees to increased ease reaching.    Baseline 130 post op; 140 baseline; 144 degrees - 10/06/19    Status Achieved      PT LONG TERM GOAL #3   Title Patient will increase left shoulder active abduction to >/= 150 degrees to increased ease reaching.    Baseline 125 post op; 157 baseline; 159 abduction - 10/06/19    Status Achieved      PT LONG TERM GOAL #4   Title Patient will decrease DASH score to </=  8 for improved overall upper extremity function.    Baseline 4.55 baseline; 20.45 post op; 4.55 - 10/06/19    Status Achieved      PT LONG TERM GOAL #5   Title Patient will verbalize good understanding of risk reduction practices for lymphedema.    Baseline Pt reports feeling well educated with this now - 10/06/19    Status Achieved                 Plan - 01/31/20 1621    Clinical Impression Statement Pt returns for 6 month L-Dex screen. Her change from baseline is 4.7, which is WNLs so no further treatment indicated at this time except for pt to continue every 3 month L-Dex screens.    PT Next Visit Plan Continue L-Dex screens every 3 months, returning for 9 month screen next.    Consulted and Agree with Plan of Care Patient           Patient will benefit from skilled therapeutic intervention in order to improve the following deficits and impairments:     Visit Diagnosis: Aftercare following surgery for  neoplasm     Problem List Patient Active Problem List   Diagnosis Date Noted  . Port-A-Cath in place 10/07/2019  . Malignant neoplasm of upper-outer quadrant of left breast in female, estrogen receptor negative ([place]) 07/27/2019  . Neuroretinitis of both eyes 11/14/2018  . Retro-orbital pain   . Papilledema     Otelia Limes, PTA 01/31/2020, 4:23 PM  [place], [place], [address] Phone: [phone]   Fax:  [phone]  Name: KYNZLI REASE MRN: [id number] Date of Birth: 10/19/1965

## 2020-02-01 ENCOUNTER — Ambulatory Visit
Admission: RE | Admit: 2020-02-01 | Discharge: 2020-02-01 | Disposition: A | Discharge status: Home / Self Care | Payer: BC Managed Care – PPO | Source: Ambulatory Visit | Attending: Radiation Oncology | Admitting: Radiation Oncology

## 2020-02-01 DIAGNOSIS — C50412 Malignant neoplasm of upper-outer quadrant of left female breast: Secondary | ICD-10-CM | POA: Diagnosis not present

## 2020-02-02 ENCOUNTER — Ambulatory Visit
Admission: RE | Admit: 2020-02-02 | Discharge: 2020-02-02 | Disposition: A | Discharge status: Home / Self Care | Payer: BC Managed Care – PPO | Source: Ambulatory Visit | Attending: Radiation Oncology | Admitting: Radiation Oncology

## 2020-02-02 DIAGNOSIS — C50412 Malignant neoplasm of upper-outer quadrant of left female breast: Secondary | ICD-10-CM | POA: Diagnosis not present

## 2020-02-03 ENCOUNTER — Other Ambulatory Visit: Payer: Self-pay

## 2020-02-03 ENCOUNTER — Ambulatory Visit
Admission: RE | Admit: 2020-02-03 | Discharge: 2020-02-03 | Disposition: A | Discharge status: Home / Self Care | Payer: BC Managed Care – PPO | Source: Ambulatory Visit | Attending: Radiation Oncology | Admitting: Radiation Oncology

## 2020-02-03 ENCOUNTER — Encounter: Payer: Self-pay | Admitting: *Deleted

## 2020-02-03 DIAGNOSIS — C50412 Malignant neoplasm of upper-outer quadrant of left female breast: Secondary | ICD-10-CM | POA: Diagnosis not present

## 2020-02-04 ENCOUNTER — Encounter: Payer: Self-pay | Admitting: Radiation Oncology

## 2020-02-04 ENCOUNTER — Ambulatory Visit
Admission: RE | Admit: 2020-02-04 | Discharge: 2020-02-04 | Disposition: A | Discharge status: Home / Self Care | Payer: BC Managed Care – PPO | Source: Ambulatory Visit | Attending: Radiation Oncology | Admitting: Radiation Oncology

## 2020-02-04 DIAGNOSIS — C50412 Malignant neoplasm of upper-outer quadrant of left female breast: Secondary | ICD-10-CM | POA: Diagnosis not present

## 2020-02-09 NOTE — Progress Notes (Signed)
Patient Care Team: Antony Blackbird, MD as PCP - General (Family Medicine) Mauro Kaufmann, RN as Oncology Nurse Navigator Rockwell Germany, RN as Oncology Nurse Navigator Rolm Bookbinder, MD as Consulting Physician (General Surgery) Nicholas Lose, MD as Consulting Physician (Hematology and Oncology) Kyung Rudd, MD as Consulting Physician (Radiation Oncology)  DIAGNOSIS:    ICD-10-CM   1. Malignant neoplasm of upper-outer quadrant of left breast in female, estrogen receptor negative ([place])  C50.412 ECHOCARDIOGRAM COMPLETE   Z17.1     SUMMARY OF ONCOLOGIC HISTORY: Oncology History  Malignant neoplasm of upper-outer quadrant of left breast in female, estrogen receptor negative ([place])  07/27/2019 Initial Diagnosis   Screening mammogram showed a left breast asymmetry. Diagnostic mammogram and US showed a 1.2cm left breast mass, 12:30 position, no left axillary adenopathy. 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   08/05/2019 Surgery   Left lumpectomy: Grade 3 IDC, 2 cm with high-grade DCIS, lymphovascular invasion was identified, 1 lymph node negative, margins negative, ER 0%, PR 0%, HER-2 positive, Ki-67 90%   08/05/2019 Cancer Staging   Staging form: Breast, AJCC 8th Edition - Pathologic stage from 08/05/2019: Stage IIA (pT2, pN0, cM0, G3, ER-, PR-, HER2+) - Signed by Gardenia Phlegm, NP on 08/18/2019   08/30/2019 -  Chemotherapy   The patient had PACLitaxel (TAXOL) 138 mg in sodium chloride 0.9 % 250 mL chemo infusion (</= 74m/m2), 80 mg/m2 = 138 mg, Intravenous,  Once, 3 of 3 cycles Administration: 138 mg (08/30/2019), 138 mg (09/06/2019), 138 mg (09/30/2019), 138 mg (09/13/2019), 138 mg (09/22/2019), 138 mg (10/07/2019), 138 mg (10/15/2019), 138 mg (10/21/2019), 138 mg (10/28/2019), 138 mg (11/04/2019), 138 mg (11/11/2019), 138 mg (11/18/2019) trastuzumab-dkst (OGIVRI) 273 mg in sodium chloride 0.9 % 250 mL chemo infusion, 4 mg/kg = 273 mg, Intravenous,  Once, 5 of  16 cycles Dose modification: 6 mg/kg (original dose 2 mg/kg, Cycle 3, Reason: Other (see comments), Comment: begin maintenance trastuzumab) Administration: 273 mg (08/30/2019), 150 mg (09/06/2019), 150 mg (09/30/2019), 420 mg (12/30/2019), 420 mg (01/20/2020), 150 mg (09/13/2019), 150 mg (09/22/2019), 150 mg (10/07/2019), 150 mg (10/15/2019), 150 mg (10/21/2019), 150 mg (10/28/2019), 150 mg (11/04/2019), 150 mg (11/11/2019), 450 mg (11/18/2019)  for chemotherapy treatment.      CHIEF COMPLIANT: Follow-up for Herceptin maintenance  INTERVAL HISTORY: Sherri FINAMOREis a 54y.o. with above-mentioned history of left breast cancerwhounderwent a lumpectomy, adjuvant chemotherapy, and is currently on adjuvant Herceptin maintenance.She is a participant in the neuropathy research study.She presents to the clinic todayfor treatment.  Other than intermittent diarrhea she is tolerating the Herceptin extremely well.  Energy levels are back.  ALLERGIES:  is allergic to tape.  MEDICATIONS:  Current Outpatient Medications  Medication Sig Dispense Refill  . ibuprofen (ADVIL) 200 MG tablet Take 400-600 mg by mouth every 8 (eight) hours as needed for moderate pain.  (Patient not taking: Reported on 12/30/2019)    . lidocaine-prilocaine (EMLA) cream Apply to affected area once (Patient not taking: Reported on 12/30/2019) 30 g 3  . Multiple Vitamin (MULTIVITAMIN WITH MINERALS) TABS tablet Take 1 tablet by mouth daily. One-A-Day for Women 50+ (Patient not taking: Reported on 12/30/2019)     No current facility-administered medications for this visit.    PHYSICAL EXAMINATION: ECOG PERFORMANCE STATUS: 1 - Symptomatic but completely ambulatory  Vitals:   02/10/20 1409  BP: 136/76  Pulse: 90  Resp: 17  Temp: 97.8 F (36.6 C)  SpO2: 98%  Filed Weights   02/10/20 1409  Weight: 157 lb 4.8 oz (71.4 kg)    LABORATORY DATA:  I have reviewed the data as listed CMP Latest Ref Rng & Units 12/30/2019 11/18/2019 11/11/2019  Glucose  70 - 99 mg/dL 97 97 95  BUN 6 - 20 mg/dL 19 13 15   Creatinine 0.44 - 1.00 mg/dL 0.92 0.80 0.79  Sodium 135 - 145 mmol/L 142 141 141  Potassium 3.5 - 5.1 mmol/L 3.9 4.2 4.3  Chloride 98 - 111 mmol/L 108 109 109  CO2 22 - 32 mmol/L 25 23 24   Calcium 8.9 - 10.3 mg/dL 8.8(L) 9.1 9.0  Total Protein 6.5 - 8.1 g/dL 6.9 6.7 6.7  Total Bilirubin 0.3 - 1.2 mg/dL <0.2(L) 0.3 0.3  Alkaline Phos 38 - 126 U/L 94 92 91  AST 15 - 41 U/L 17 19 22   ALT 0 - 44 U/L 16 24 26     Lab Results  Component Value Date   WBC 6.6 02/10/2020   HGB 14.2 02/10/2020   HCT 42.4 02/10/2020   MCV 93.8 02/10/2020   PLT 205 02/10/2020   NEUTROABS 4.5 02/10/2020    ASSESSMENT & PLAN:  Malignant neoplasm of upper-outer quadrant of left breast in female, estrogen receptor negative ([place]) 07/27/2019:Screening mammogram showed a left breast asymmetry. Diagnostic mammogram and US showed a 1.2cm left breast mass, 12:30 position, no left axillary adenopathy. 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 T1c N0 stage Ia clinical stage  Recommendation: 1.08/05/2019:Left lumpectomy: Grade 3 IDC, 2 cm with high-grade DCIS, lymphovascular invasion was identified, 1 lymph node negative, margins negative, ER 0%, PR 0%, HER-2 positive, Ki-67 90% 2.Adjuvant chemotherapy with Taxol Herceptin followed by Herceptin maintenance for 1 yearstarted 08/30/2019 completed 11/18/2019 3.Adjuvant radiation therapy 01/11/2020-02/04/2020 Patient is participating inSWOG S 1714neuropathy clinical trial ------------------------------------------------------------------------------------------------------------------------------------------------------  Current treatment: Herceptin maintenance every 3 weeks Mild intermittent diarrhea Echocardiogram 11/26/2019: EF 60 to 65% I sent an order for another echocardiogram to be done next month.  Return to clinic every 3 weeks for Herceptin every 6 weeks to follow-up with me and  labs  Orders Placed This Encounter  Procedures  . ECHOCARDIOGRAM COMPLETE    Standing Status:   Future    Standing Expiration Date:   02/09/2021    Order Specific Question:   Where should this test be performed    Answer:   [place]    Order Specific Question:   Perflutren DEFINITY (image enhancing agent) should be administered unless hypersensitivity or allergy exist    Answer:   Administer Perflutren    Order Specific Question:   Reason for exam-Echo    Answer:   Chemo  V67.2 / Z09    Order Specific Question:   Release to patient    Answer:   Immediate   The patient has a good understanding of the overall plan. she agrees with it. she will call with any problems that may develop before the next visit here.  Total time spent: 30 mins including face to face time and time spent for planning, charting and coordination of care  Nicholas Lose, MD 02/10/2020  I, Cloyde Reams Dorshimer, am acting as scribe for Dr. Nicholas Lose.  I have reviewed the above documentation for accuracy and completeness, and I agree with the above.

## 2020-02-10 ENCOUNTER — Inpatient Hospital Stay: Payer: BC Managed Care – PPO

## 2020-02-10 ENCOUNTER — Inpatient Hospital Stay: Payer: BC Managed Care – PPO | Attending: Hematology and Oncology

## 2020-02-10 ENCOUNTER — Inpatient Hospital Stay: Payer: BC Managed Care – PPO | Admitting: Hematology and Oncology

## 2020-02-10 ENCOUNTER — Other Ambulatory Visit: Payer: Self-pay

## 2020-02-10 DIAGNOSIS — Z171 Estrogen receptor negative status [ER-]: Secondary | ICD-10-CM

## 2020-02-10 DIAGNOSIS — C50412 Malignant neoplasm of upper-outer quadrant of left female breast: Secondary | ICD-10-CM

## 2020-02-10 DIAGNOSIS — Z5112 Encounter for antineoplastic immunotherapy: Secondary | ICD-10-CM | POA: Diagnosis not present

## 2020-02-10 DIAGNOSIS — Z23 Encounter for immunization: Secondary | ICD-10-CM | POA: Insufficient documentation

## 2020-02-10 DIAGNOSIS — Z95828 Presence of other vascular implants and grafts: Secondary | ICD-10-CM

## 2020-02-10 LAB — CMP (CANCER CENTER ONLY)
ALT: 15 U/L (ref 0–44)
AST: 16 U/L (ref 15–41)
Albumin: 3.7 g/dL (ref 3.5–5.0)
Alkaline Phosphatase: 87 U/L (ref 38–126)
Anion gap: 8 (ref 5–15)
BUN: 20 mg/dL (ref 6–20)
CO2: 26 mmol/L (ref 22–32)
Calcium: 9 mg/dL (ref 8.9–10.3)
Chloride: 108 mmol/L (ref 98–111)
Creatinine: 0.78 mg/dL (ref 0.44–1.00)
GFR, Estimated: >60 mL/min (ref >60)
Glucose, Bld: 93 mg/dL (ref 70–99)
Potassium: 3.9 mmol/L (ref 3.5–5.1)
Sodium: 142 mmol/L (ref 135–145)
Total Bilirubin: 0.3 mg/dL (ref 0.3–1.2)
Total Protein: 6.8 g/dL (ref 6.5–8.1)

## 2020-02-10 LAB — CBC WITH DIFFERENTIAL (CANCER CENTER ONLY)
Abs Immature Granulocytes: 0.01 10*3/uL (ref 0.00–0.07)
Basophils Absolute: 0 10*3/uL (ref 0.0–0.1)
Basophils Relative: 0 %
Eosinophils Absolute: 0.1 10*3/uL (ref 0.0–0.5)
Eosinophils Relative: 1 %
HCT: 42.4 % (ref 36.0–46.0)
Hemoglobin: 14.2 g/dL (ref 12.0–15.0)
Immature Granulocytes: 0 %
Lymphocytes Relative: 23 %
Lymphs Abs: 1.5 10*3/uL (ref 0.7–4.0)
MCH: 31.4 pg (ref 26.0–34.0)
MCHC: 33.5 g/dL (ref 30.0–36.0)
MCV: 93.8 fL (ref 80.0–100.0)
Monocytes Absolute: 0.4 10*3/uL (ref 0.1–1.0)
Monocytes Relative: 7 %
Neutro Abs: 4.5 10*3/uL (ref 1.7–7.7)
Neutrophils Relative %: 69 %
Platelet Count: 205 10*3/uL (ref 150–400)
RBC: 4.52 MIL/uL (ref 3.87–5.11)
RDW: 12.2 % (ref 11.5–15.5)
WBC Count: 6.6 10*3/uL (ref 4.0–10.5)
nRBC: 0 % (ref 0.0–0.2)

## 2020-02-10 MED ORDER — ACETAMINOPHEN 325 MG PO TABS
ORAL_TABLET | ORAL | Status: AC
  Filled 2020-02-10: qty 2

## 2020-02-10 MED ORDER — DIPHENHYDRAMINE HCL 25 MG PO CAPS
ORAL_CAPSULE | ORAL | Status: AC
  Filled 2020-02-10: qty 2

## 2020-02-10 MED ORDER — SODIUM CHLORIDE 0.9% FLUSH
10.0000 mL | INTRAVENOUS | Status: DC | PRN
  Administered 2020-02-10: 10 mL
  Filled 2020-02-10: qty 10

## 2020-02-10 MED ORDER — DIPHENHYDRAMINE HCL 25 MG PO CAPS
50.0000 mg | ORAL_CAPSULE | Freq: Once | ORAL | Status: AC
  Administered 2020-02-10: 50 mg via ORAL

## 2020-02-10 MED ORDER — TRASTUZUMAB-DKST CHEMO 150 MG IV SOLR
6.0000 mg/kg | Freq: Once | INTRAVENOUS | Status: AC
  Administered 2020-02-10: 420 mg via INTRAVENOUS
  Filled 2020-02-10: qty 20

## 2020-02-10 MED ORDER — INFLUENZA VAC SPLIT QUAD 0.5 ML IM SUSY
0.5000 mL | PREFILLED_SYRINGE | Freq: Once | INTRAMUSCULAR | Status: AC
  Administered 2020-02-10: 0.5 mL via INTRAMUSCULAR

## 2020-02-10 MED ORDER — HEPARIN SOD (PORK) LOCK FLUSH 100 UNIT/ML IV SOLN
500.0000 [IU] | Freq: Once | INTRAVENOUS | Status: AC | PRN
  Administered 2020-02-10: 500 [IU]
  Filled 2020-02-10: qty 5

## 2020-02-10 MED ORDER — ACETAMINOPHEN 325 MG PO TABS
650.0000 mg | ORAL_TABLET | Freq: Once | ORAL | Status: AC
  Administered 2020-02-10: 650 mg via ORAL

## 2020-02-10 MED ORDER — SODIUM CHLORIDE 0.9 % IV SOLN
Freq: Once | INTRAVENOUS | Status: AC
  Filled 2020-02-10: qty 250

## 2020-02-10 MED ORDER — INFLUENZA VAC SPLIT QUAD 0.5 ML IM SUSY
PREFILLED_SYRINGE | INTRAMUSCULAR | Status: AC
  Filled 2020-02-10: qty 0.5

## 2020-02-10 MED ORDER — SODIUM CHLORIDE 0.9% FLUSH
10.0000 mL | Freq: Once | INTRAVENOUS | Status: AC
  Administered 2020-02-10: 10 mL
  Filled 2020-02-10: qty 10

## 2020-02-10 NOTE — Patient Instructions (Signed)
[facility] Discharge Instructions for Patients Receiving Chemotherapy  Today you received the following chemotherapy agents trastuzumab.  To help prevent nausea and vomiting after your treatment, we encourage you to take your nausea medication as directed.    If you develop nausea and vomiting that is not controlled by your nausea medication, call the clinic.   BELOW ARE SYMPTOMS THAT SHOULD BE REPORTED IMMEDIATELY:  *FEVER GREATER THAN 100.5 F  *CHILLS WITH OR WITHOUT FEVER  NAUSEA AND VOMITING THAT IS NOT CONTROLLED WITH YOUR NAUSEA MEDICATION  *UNUSUAL SHORTNESS OF BREATH  *UNUSUAL BRUISING OR BLEEDING  TENDERNESS IN MOUTH AND THROAT WITH OR WITHOUT PRESENCE OF ULCERS  *URINARY PROBLEMS  *BOWEL PROBLEMS  UNUSUAL RASH Items with * indicate a potential emergency and should be followed up as soon as possible.  Feel free to call the clinic should you have any questions or concerns. The clinic phone number is [phone].  Please show the CHEMO ALERT CARD at check-in to the Emergency Department and triage nurse.   

## 2020-02-10 NOTE — Assessment & Plan Note (Signed)
07/27/2019:Screening mammogram showed a left breast asymmetry. Diagnostic mammogram and US showed a 1.2cm left breast mass, 12:30 position, no left axillary adenopathy. 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 T1c N0 stage Ia clinical stage  Recommendation: 1.08/05/2019:Left lumpectomy: Grade 3 IDC, 2 cm with high-grade DCIS, lymphovascular invasion was identified, 1 lymph node negative, margins negative, ER 0%, PR 0%, HER-2 positive, Ki-67 90% 2.Adjuvant chemotherapy with Taxol Herceptin followed by Herceptin maintenance for 1 yearstarted 08/30/2019 completed 11/18/2019 3.Adjuvant radiation therapy 01/11/2020-02/04/2020 Patient is participating inSWOG S 1714neuropathy clinical trial ------------------------------------------------------------------------------------------------------------------------------------------------------  Current treatment: Herceptin maintenance every 3 weeks Denies any side effects to Herceptin. Return to clinic every 3 weeks for Herceptin every 6 weeks to follow-up with me and labs

## 2020-02-14 ENCOUNTER — Telehealth: Payer: Self-pay | Admitting: Hematology and Oncology

## 2020-02-14 NOTE — Telephone Encounter (Signed)
Per 10/21 los Echocardiogram in 1 month

## 2020-02-28 ENCOUNTER — Encounter: Payer: Self-pay | Admitting: Medical Oncology

## 2020-02-28 DIAGNOSIS — C50412 Malignant neoplasm of upper-outer quadrant of left female breast: Secondary | ICD-10-CM

## 2020-02-28 DIAGNOSIS — Z171 Estrogen receptor negative status [ER-]: Secondary | ICD-10-CM

## 2020-02-28 NOTE — Progress Notes (Signed)
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 Week 24 visit Outgoing call: Call to patient this afternoon to schedule her 24 month on study assessment. Patient in clinic for treatment on Friday, 11/12. I inquired with patient if we can complete study visit at this time and if she is able to arrive 15 minutes prior to her scheduled infusion appointment, patient stated she could. I informed patient that I will not be in the office on Friday, but research nurse, Doristine Johns will be completing this assessment. Informed patient that I can complete the majority of the 24 week questions over the phone and Lexine Baton will complete the Neuropen and tuning fork assessment when patient is in clinic on Friday. I also informed patient that she can complete the questionnaires at that time as well.  PROs: Questionnaires to be collected11/12, when patient in clinic.  Supplements, Topical Agents and other treatments: Reviewed with patient and updated on study form.  Labs: No labs for study to be collected. Patient did not consent to the optional labs.  Physician Assessments: to be completed 11/12. History of Falls: Will be completed 11/12.  Assessment for Interventions for CIPN: Reviewed with patient and CRFs completed. Patient reports no issues or changes from baseline, and declines having any neuropathic issues. Neuropen Assessment: to be assessed 11/12. Tuning Fork Assessment: to be assessed 11/12. Timed Get Up and Go Test: to be completed 11/12. Plan: Next study assessments will be for week 52, between April to June of 2022. Patient denied having any questions at this time. Patient was thanked for her time and continued contribution and support of study and she was encouraged to call me for any questions or concerns she may have prior to her next appointment.  New appointment was placed for 0900 for study assessment, with Doristine Johns, RN.  Maxwell Marion, RN, BSN, [facility] Clinical Research 02/28/2020 4:04 PM

## 2020-03-02 ENCOUNTER — Ambulatory Visit: Payer: BC Managed Care – PPO

## 2020-03-03 ENCOUNTER — Inpatient Hospital Stay: Payer: BC Managed Care – PPO | Attending: Hematology and Oncology

## 2020-03-03 ENCOUNTER — Encounter: Payer: Self-pay | Admitting: *Deleted

## 2020-03-03 ENCOUNTER — Other Ambulatory Visit: Payer: Self-pay

## 2020-03-03 ENCOUNTER — Inpatient Hospital Stay: Payer: BC Managed Care – PPO | Admitting: *Deleted

## 2020-03-03 VITALS — BP 148/71 | HR 88 | Temp 98.6°F | Resp 16

## 2020-03-03 DIAGNOSIS — Z79899 Other long term (current) drug therapy: Secondary | ICD-10-CM | POA: Diagnosis not present

## 2020-03-03 DIAGNOSIS — C50412 Malignant neoplasm of upper-outer quadrant of left female breast: Secondary | ICD-10-CM | POA: Insufficient documentation

## 2020-03-03 DIAGNOSIS — Z5112 Encounter for antineoplastic immunotherapy: Secondary | ICD-10-CM | POA: Diagnosis not present

## 2020-03-03 DIAGNOSIS — Z171 Estrogen receptor negative status [ER-]: Secondary | ICD-10-CM | POA: Insufficient documentation

## 2020-03-03 MED ORDER — SODIUM CHLORIDE 0.9 % IV SOLN
Freq: Once | INTRAVENOUS | Status: AC
  Filled 2020-03-03: qty 250

## 2020-03-03 MED ORDER — HEPARIN SOD (PORK) LOCK FLUSH 100 UNIT/ML IV SOLN
500.0000 [IU] | Freq: Once | INTRAVENOUS | Status: AC | PRN
  Administered 2020-03-03: 500 [IU]
  Filled 2020-03-03: qty 5

## 2020-03-03 MED ORDER — ACETAMINOPHEN 325 MG PO TABS
650.0000 mg | ORAL_TABLET | Freq: Once | ORAL | Status: AC
  Administered 2020-03-03: 650 mg via ORAL

## 2020-03-03 MED ORDER — TRASTUZUMAB-DKST CHEMO 150 MG IV SOLR
450.0000 mg | Freq: Once | INTRAVENOUS | Status: AC
  Administered 2020-03-03: 450 mg via INTRAVENOUS
  Filled 2020-03-03: qty 21.43

## 2020-03-03 MED ORDER — DIPHENHYDRAMINE HCL 25 MG PO CAPS
50.0000 mg | ORAL_CAPSULE | Freq: Once | ORAL | Status: AC
  Administered 2020-03-03: 50 mg via ORAL

## 2020-03-03 MED ORDER — SODIUM CHLORIDE 0.9% FLUSH
10.0000 mL | INTRAVENOUS | Status: DC | PRN
  Administered 2020-03-03: 10 mL
  Filled 2020-03-03: qty 10

## 2020-03-03 MED ORDER — ACETAMINOPHEN 325 MG PO TABS
ORAL_TABLET | ORAL | Status: AC
  Filled 2020-03-03: qty 2

## 2020-03-03 MED ORDER — DIPHENHYDRAMINE HCL 25 MG PO CAPS
ORAL_CAPSULE | ORAL | Status: AC
  Filled 2020-03-03: qty 2

## 2020-03-03 NOTE — Research (Signed)
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 Week 24 visit Patient presented to the clinic, alone today, for assessments and treatment. I met with patient before her infusion appointment along with Carol Ada, [place] Coordinator I.    Patient and I reviewed her medication list, and patient confirms she is not taking anything for neuropathy symptoms. Patient denies having any new or concerning issues.  Patient confirms today that she is not experiencing any neuropathy related to chemotherapy treatments.   PROs:Questionnaireswereprovided to patient to complete upon arrival to clinic. Completed questionnaires were collected and checked for completeness and accuracy.  Labs:No labs for study to be collected. Patient did not consent to the optional labs.  Physician Assessments:CTCAE and Treatment Burden assessment forms completed and signed by Dr. Lindi Adie. History of Falls:The pt was asked about any falls within the past 6 months.  The pt stated that she did have 2 falls within the past 6 months.   Timed Get Up and Go Test:  The pt completed this test in 9.1 seconds.  Assessment for Interventions for CIPN:Reviewed with patient andCRFs completed.  The pt denied using any complementary and alternative therapies for neuropathy symptoms.   Neuropen Assessment:Completed per protocol, by this certified research RN, with time and documentation completed by Carol Ada, [place] Coordinator I. [place] Assessment:Completed per protocol, by this Film/video editor, with time and documentation completed by Carol Ada, [place] informed, next study assessments for week 52, will be in May 2022.Patient denied having any questions at this time.Patientwas thanked for her time and continued contribution and support of study and she was encouraged to  call her research nurseforany questions or concernsshe may haveprior tohernext appointment. Brion Aliment RN, BSN, CCRP Clinical Research Nurse 03/03/2020 10:24 AM

## 2020-03-03 NOTE — Progress Notes (Signed)
Patient c/o 1 week h/o discomfort and "knot" at [facility] site. On palpation, noted ~2-3 mm round area superior and medial to PAC. Patient verbalized discomfort with palpation. Dr. Lindi Adie evaluated patient in his office and advised ok to use [facility] for tx today. Patient aware and agrees to plan.

## 2020-03-03 NOTE — Patient Instructions (Signed)
[facility] Discharge Instructions for Patients Receiving Chemotherapy  Today you received the following chemotherapy agents trastuzumab.  To help prevent nausea and vomiting after your treatment, we encourage you to take your nausea medication as directed.    If you develop nausea and vomiting that is not controlled by your nausea medication, call the clinic.   BELOW ARE SYMPTOMS THAT SHOULD BE REPORTED IMMEDIATELY:  *FEVER GREATER THAN 100.5 F  *CHILLS WITH OR WITHOUT FEVER  NAUSEA AND VOMITING THAT IS NOT CONTROLLED WITH YOUR NAUSEA MEDICATION  *UNUSUAL SHORTNESS OF BREATH  *UNUSUAL BRUISING OR BLEEDING  TENDERNESS IN MOUTH AND THROAT WITH OR WITHOUT PRESENCE OF ULCERS  *URINARY PROBLEMS  *BOWEL PROBLEMS  UNUSUAL RASH Items with * indicate a potential emergency and should be followed up as soon as possible.  Feel free to call the clinic should you have any questions or concerns. The clinic phone number is [phone].  Please show the CHEMO ALERT CARD at check-in to the Emergency Department and triage nurse.   

## 2020-03-06 ENCOUNTER — Telehealth: Payer: Self-pay | Admitting: Radiation Oncology

## 2020-03-06 NOTE — Telephone Encounter (Signed)
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  Interval Since Last Radiation: 5 weeks   01/10/20-10/15/2: The left breast was treated to 42.56 Gy in 16 fractions followed by an 8 Gy boost in 4 fractions.   Narrative:  The patient was contacted today for routine follow-up. During treatment she did very well with radiotherapy and did not have significant desquamation. She reports she is doing well and her skin is healing up nicely without concerns. She is doing well with her q3 week Herceptin and sees Dr. Lindi Adie every 6 weeks while receiving this.  Impression/Plan: 1. 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 The patient has been doing well since completion of radiotherapy. We discussed that we would be happy to continue to follow her as needed, but she will also continue to follow up with Dr. Lindi Adie in medical oncology. She was counseled on skin care as well as measures to avoid sun exposure to this area.  2. Survivorship. We discussed the importance of survivorship evaluation and encouraged her to attend her upcoming visit with that clinic.    Carola Rhine, PAC

## 2020-03-13 ENCOUNTER — Other Ambulatory Visit: Payer: Self-pay

## 2020-03-13 ENCOUNTER — Ambulatory Visit (HOSPITAL_COMMUNITY)
Admission: RE | Admit: 2020-03-13 | Discharge: 2020-03-13 | Disposition: A | Discharge status: Home / Self Care | Payer: BC Managed Care – PPO | Source: Ambulatory Visit | Attending: Hematology and Oncology | Admitting: Hematology and Oncology

## 2020-03-13 DIAGNOSIS — C50412 Malignant neoplasm of upper-outer quadrant of left female breast: Secondary | ICD-10-CM | POA: Diagnosis not present

## 2020-03-13 DIAGNOSIS — Z0189 Encounter for other specified special examinations: Secondary | ICD-10-CM | POA: Diagnosis not present

## 2020-03-13 DIAGNOSIS — Z171 Estrogen receptor negative status [ER-]: Secondary | ICD-10-CM | POA: Insufficient documentation

## 2020-03-13 LAB — ECHOCARDIOGRAM COMPLETE
Area-P 1/2: 2.95 cm2
S' Lateral: 3.2 cm

## 2020-03-13 NOTE — Progress Notes (Signed)
Echocardiogram 2D Echocardiogram has been performed.  Oneal Deputy Mae Cianci 03/13/2020, 9:33 AM

## 2020-03-20 NOTE — Progress Notes (Signed)
  Radiation Oncology         [phone] ________________________________  Name: [name] MRN: [id number]  Date: 02/04/2020  DOB: 12/17/1965  End of Treatment Note  Diagnosis:   left-sided breast cancer     Indication for treatment:  Curative       Radiation treatment dates:   01/10/20 - 02/04/20  Site/dose:   The patient initially received a dose of 42.56 Gy in 16 fractions to the breast using whole-breast tangent fields. This was delivered using a 3-D conformal technique. The patient then received a boost to the seroma. This delivered an additional 8 Gy in 2fractions using a 3 field photon technique due to the depth of the seroma. The total dose was 50.56 Gy.  Narrative: The patient tolerated radiation treatment relatively well.   The patient had some expected skin irritation as she progressed during treatment.   Plan: The patient has completed radiation treatment. The patient will return to radiation oncology clinic for routine followup in one month. I advised the patient to call or return sooner if they have any questions or concerns related to their recovery or treatment. ________________________________  Jodelle Gross, M.D., Ph.D.

## 2020-03-22 NOTE — Assessment & Plan Note (Signed)
07/27/2019:Screening mammogram showed a left breast asymmetry. Diagnostic mammogram and US showed a 1.2cm left breast mass, 12:30 position, no left axillary adenopathy. 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 T1c N0 stage Ia clinical stage  Recommendation: 1.08/05/2019:Left lumpectomy: Grade 3 IDC, 2 cm with high-grade DCIS, lymphovascular invasion was identified, 1 lymph node negative, margins negative, ER 0%, PR 0%, HER-2 positive, Ki-67 90% 2.Adjuvant chemotherapy with Taxol Herceptin followed by Herceptin maintenance for 1 yearstarted 5/10/2021completed 11/18/2019 3.Adjuvant radiation therapy 01/11/2020-02/04/2020 Patient is participating inSWOG S 1714neuropathy clinical trial ------------------------------------------------------------------------------------------------------------------------------------------------------ Current treatment: Herceptin maintenance every 3 weeks Mild intermittent diarrhea Echocardiogram 03/13/2020: EF 55-60%  Return to clinic every 3 weeks for Herceptin every 6 weeks to follow-up with me and labs

## 2020-03-22 NOTE — Progress Notes (Signed)
Patient Care Team: Antony Blackbird, MD as PCP - General (Family Medicine) Mauro Kaufmann, RN as Oncology Nurse Navigator Rockwell Germany, RN as Oncology Nurse Navigator Rolm Bookbinder, MD as Consulting Physician (General Surgery) Nicholas Lose, MD as Consulting Physician (Hematology and Oncology) Kyung Rudd, MD as Consulting Physician (Radiation Oncology)  DIAGNOSIS:    ICD-10-CM   1. Malignant neoplasm of upper-outer quadrant of left breast in female, estrogen receptor negative ([place])  C50.412    Z17.1     SUMMARY OF ONCOLOGIC HISTORY: Oncology History  Malignant neoplasm of upper-outer quadrant of left breast in female, estrogen receptor negative ([place])  07/27/2019 Initial Diagnosis   Screening mammogram showed a left breast asymmetry. Diagnostic mammogram and US showed a 1.2cm left breast mass, 12:30 position, no left axillary adenopathy. 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   08/05/2019 Surgery   Left lumpectomy: Grade 3 IDC, 2 cm with high-grade DCIS, lymphovascular invasion was identified, 1 lymph node negative, margins negative, ER 0%, PR 0%, HER-2 positive, Ki-67 90%   08/05/2019 Cancer Staging   Staging form: Breast, AJCC 8th Edition - Pathologic stage from 08/05/2019: Stage IIA (pT2, pN0, cM0, G3, ER-, PR-, HER2+) - Signed by Gardenia Phlegm, NP on 08/18/2019   08/30/2019 -  Chemotherapy   The patient had PACLitaxel (TAXOL) 138 mg in sodium chloride 0.9 % 250 mL chemo infusion (</= 66m/m2), 80 mg/m2 = 138 mg, Intravenous,  Once, 3 of 3 cycles Administration: 138 mg (08/30/2019), 138 mg (09/06/2019), 138 mg (09/30/2019), 138 mg (09/13/2019), 138 mg (09/22/2019), 138 mg (10/07/2019), 138 mg (10/15/2019), 138 mg (10/21/2019), 138 mg (10/28/2019), 138 mg (11/04/2019), 138 mg (11/11/2019), 138 mg (11/18/2019) trastuzumab-dkst (OGIVRI) 273 mg in sodium chloride 0.9 % 250 mL chemo infusion, 4 mg/kg = 273 mg, Intravenous,  Once, 7 of 16 cycles Dose  modification: 6 mg/kg (original dose 2 mg/kg, Cycle 3, Reason: Other (see comments), Comment: begin maintenance trastuzumab) Administration: 273 mg (08/30/2019), 150 mg (09/06/2019), 150 mg (09/30/2019), 420 mg (12/30/2019), 420 mg (01/20/2020), 150 mg (09/13/2019), 150 mg (09/22/2019), 150 mg (10/07/2019), 150 mg (10/15/2019), 150 mg (10/21/2019), 150 mg (10/28/2019), 150 mg (11/04/2019), 150 mg (11/11/2019), 450 mg (11/18/2019), 420 mg (02/10/2020), 450 mg (03/03/2020)  for chemotherapy treatment.      CHIEF COMPLIANT: Follow-up for Herceptin maintenance  INTERVAL HISTORY: Sherri SAALis a 54y.o. with above-mentioned history of left breast cancerwhounderwent a lumpectomy,adjuvant chemotherapy, and is currently on adjuvant Herceptin maintenance.She is a participant in the neuropathy research study.Echo on 03/13/20 showed an ejection fraction of 55-60%. She presents to the clinic todayfortreatment.    She is tolerating Herceptin extremely well with mild diarrhea intermittently.  She is complaining of diffuse joint and muscle aches and pains.  ALLERGIES:  is allergic to tape.  MEDICATIONS:  Current Outpatient Medications  Medication Sig Dispense Refill  . ibuprofen (ADVIL) 200 MG tablet Take 400-600 mg by mouth every 8 (eight) hours as needed for moderate pain.  (Patient not taking: Reported on 12/30/2019)    . lidocaine-prilocaine (EMLA) cream Apply to affected area once (Patient not taking: Reported on 12/30/2019) 30 g 3  . Multiple Vitamin (MULTIVITAMIN WITH MINERALS) TABS tablet Take 1 tablet by mouth daily. One-A-Day for Women 50+ (Patient not taking: Reported on 12/30/2019)     No current facility-administered medications for this visit.    PHYSICAL EXAMINATION: ECOG PERFORMANCE STATUS: 1 - Symptomatic but completely ambulatory  Vitals:   03/23/20  1513  BP: 134/65  Pulse: 83  Resp: 18  Temp: 98.3 F (36.8 C)  SpO2: 100%   Filed Weights   03/23/20 1513  Weight: 160 lb 4.8 oz (72.7 kg)      LABORATORY DATA:  I have reviewed the data as listed CMP Latest Ref Rng & Units 02/10/2020 12/30/2019 11/18/2019  Glucose 70 - 99 mg/dL 93 97 97  BUN 6 - 20 mg/dL 20 19 13   Creatinine 0.44 - 1.00 mg/dL 0.78 0.92 0.80  Sodium 135 - 145 mmol/L 142 142 141  Potassium 3.5 - 5.1 mmol/L 3.9 3.9 4.2  Chloride 98 - 111 mmol/L 108 108 109  CO2 22 - 32 mmol/L 26 25 23   Calcium 8.9 - 10.3 mg/dL 9.0 8.8(L) 9.1  Total Protein 6.5 - 8.1 g/dL 6.8 6.9 6.7  Total Bilirubin 0.3 - 1.2 mg/dL 0.3 <0.2(L) 0.3  Alkaline Phos 38 - 126 U/L 87 94 92  AST 15 - 41 U/L 16 17 19   ALT 0 - 44 U/L 15 16 24     Lab Results  Component Value Date   WBC 5.9 03/23/2020   HGB 13.8 03/23/2020   HCT 41.0 03/23/2020   MCV 94.0 03/23/2020   PLT 217 03/23/2020   NEUTROABS 3.4 03/23/2020    ASSESSMENT & PLAN:  Malignant neoplasm of upper-outer quadrant of left breast in female, estrogen receptor negative ([place]) 07/27/2019:Screening mammogram showed a left breast asymmetry. Diagnostic mammogram and US showed a 1.2cm left breast mass, 12:30 position, no left axillary adenopathy. 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 T1c N0 stage Ia clinical stage  Recommendation: 1.08/05/2019:Left lumpectomy: Grade 3 IDC, 2 cm with high-grade DCIS, lymphovascular invasion was identified, 1 lymph node negative, margins negative, ER 0%, PR 0%, HER-2 positive, Ki-67 90% 2.Adjuvant chemotherapy with Taxol Herceptin followed by Herceptin maintenance for 1 yearstarted 5/10/2021completed 11/18/2019 3.Adjuvant radiation therapy 01/11/2020-02/04/2020 Patient is participating inSWOG S 1714neuropathy clinical trial ------------------------------------------------------------------------------------------------------------------------------------------------------ Current treatment: Herceptin maintenance every 3 weeks Mild intermittent diarrhea Echocardiogram 03/13/2020: EF 55-60% Diffuse muscle body aches  and pains especially in the joints: I discussed with her about stretching and doing yoga daily (consolidation) along with taking turmeric 5 mg capsule daily.  Return to clinic every 3 weeks for Herceptin every 6 weeks to follow-up with me and labs     No orders of the defined types were placed in this encounter.  The patient has a good understanding of the overall plan. she agrees with it. she will call with any problems that may develop before the next visit here.  Total time spent: 30 mins including face to face time and time spent for planning, charting and coordination of care  Nicholas Lose, MD 03/23/2020  I, Cloyde Reams Dorshimer, am acting as scribe for Dr. Nicholas Lose.  I have reviewed the above documentation for accuracy and completeness, and I agree with the above.

## 2020-03-23 ENCOUNTER — Inpatient Hospital Stay: Payer: BC Managed Care – PPO | Attending: Hematology and Oncology

## 2020-03-23 ENCOUNTER — Inpatient Hospital Stay: Payer: BC Managed Care – PPO | Admitting: Hematology and Oncology

## 2020-03-23 ENCOUNTER — Inpatient Hospital Stay: Payer: BC Managed Care – PPO

## 2020-03-23 ENCOUNTER — Other Ambulatory Visit: Payer: Self-pay

## 2020-03-23 ENCOUNTER — Encounter: Payer: Self-pay | Admitting: *Deleted

## 2020-03-23 DIAGNOSIS — Z79899 Other long term (current) drug therapy: Secondary | ICD-10-CM | POA: Insufficient documentation

## 2020-03-23 DIAGNOSIS — Z171 Estrogen receptor negative status [ER-]: Secondary | ICD-10-CM

## 2020-03-23 DIAGNOSIS — Z5111 Encounter for antineoplastic chemotherapy: Secondary | ICD-10-CM | POA: Diagnosis not present

## 2020-03-23 DIAGNOSIS — C50412 Malignant neoplasm of upper-outer quadrant of left female breast: Secondary | ICD-10-CM

## 2020-03-23 DIAGNOSIS — Z923 Personal history of irradiation: Secondary | ICD-10-CM | POA: Insufficient documentation

## 2020-03-23 DIAGNOSIS — Z95828 Presence of other vascular implants and grafts: Secondary | ICD-10-CM

## 2020-03-23 LAB — CBC WITH DIFFERENTIAL (CANCER CENTER ONLY)
Abs Immature Granulocytes: 0.02 10*3/uL (ref 0.00–0.07)
Basophils Absolute: 0 10*3/uL (ref 0.0–0.1)
Basophils Relative: 0 %
Eosinophils Absolute: 0.1 10*3/uL (ref 0.0–0.5)
Eosinophils Relative: 2 %
HCT: 41 % (ref 36.0–46.0)
Hemoglobin: 13.8 g/dL (ref 12.0–15.0)
Immature Granulocytes: 0 %
Lymphocytes Relative: 31 %
Lymphs Abs: 1.8 10*3/uL (ref 0.7–4.0)
MCH: 31.7 pg (ref 26.0–34.0)
MCHC: 33.7 g/dL (ref 30.0–36.0)
MCV: 94 fL (ref 80.0–100.0)
Monocytes Absolute: 0.5 10*3/uL (ref 0.1–1.0)
Monocytes Relative: 8 %
Neutro Abs: 3.4 10*3/uL (ref 1.7–7.7)
Neutrophils Relative %: 59 %
Platelet Count: 217 10*3/uL (ref 150–400)
RBC: 4.36 MIL/uL (ref 3.87–5.11)
RDW: 12.9 % (ref 11.5–15.5)
WBC Count: 5.9 10*3/uL (ref 4.0–10.5)
nRBC: 0 % (ref 0.0–0.2)

## 2020-03-23 LAB — CMP (CANCER CENTER ONLY)
ALT: 19 U/L (ref 0–44)
AST: 22 U/L (ref 15–41)
Albumin: 3.8 g/dL (ref 3.5–5.0)
Alkaline Phosphatase: 91 U/L (ref 38–126)
Anion gap: 10 (ref 5–15)
BUN: 23 mg/dL — ABNORMAL HIGH (ref 6–20)
CO2: 23 mmol/L (ref 22–32)
Calcium: 8.9 mg/dL (ref 8.9–10.3)
Chloride: 107 mmol/L (ref 98–111)
Creatinine: 0.81 mg/dL (ref 0.44–1.00)
GFR, Estimated: >60 mL/min (ref >60)
Glucose, Bld: 94 mg/dL (ref 70–99)
Potassium: 4 mmol/L (ref 3.5–5.1)
Sodium: 140 mmol/L (ref 135–145)
Total Bilirubin: <0.2 mg/dL — ABNORMAL LOW (ref 0.3–1.2)
Total Protein: 6.9 g/dL (ref 6.5–8.1)

## 2020-03-23 MED ORDER — DIPHENHYDRAMINE HCL 25 MG PO CAPS
ORAL_CAPSULE | ORAL | Status: AC
  Filled 2020-03-23: qty 2

## 2020-03-23 MED ORDER — HEPARIN SOD (PORK) LOCK FLUSH 100 UNIT/ML IV SOLN
500.0000 [IU] | Freq: Once | INTRAVENOUS | Status: AC | PRN
  Administered 2020-03-23: 500 [IU]
  Filled 2020-03-23: qty 5

## 2020-03-23 MED ORDER — ACETAMINOPHEN 325 MG PO TABS
650.0000 mg | ORAL_TABLET | Freq: Once | ORAL | Status: AC
  Administered 2020-03-23: 650 mg via ORAL

## 2020-03-23 MED ORDER — ACETAMINOPHEN 325 MG PO TABS
ORAL_TABLET | ORAL | Status: AC
  Filled 2020-03-23: qty 2

## 2020-03-23 MED ORDER — TRASTUZUMAB-DKST CHEMO 150 MG IV SOLR
450.0000 mg | Freq: Once | INTRAVENOUS | Status: AC
  Administered 2020-03-23: 450 mg via INTRAVENOUS
  Filled 2020-03-23: qty 21.43

## 2020-03-23 MED ORDER — DIPHENHYDRAMINE HCL 25 MG PO CAPS
50.0000 mg | ORAL_CAPSULE | Freq: Once | ORAL | Status: AC
  Administered 2020-03-23: 50 mg via ORAL

## 2020-03-23 MED ORDER — SODIUM CHLORIDE 0.9% FLUSH
10.0000 mL | INTRAVENOUS | Status: DC | PRN
  Administered 2020-03-23: 10 mL
  Filled 2020-03-23: qty 10

## 2020-03-23 MED ORDER — SODIUM CHLORIDE 0.9% FLUSH
10.0000 mL | Freq: Once | INTRAVENOUS | Status: AC
  Administered 2020-03-23: 10 mL
  Filled 2020-03-23: qty 10

## 2020-03-23 MED ORDER — SODIUM CHLORIDE 0.9 % IV SOLN
Freq: Once | INTRAVENOUS | Status: AC
  Filled 2020-03-23: qty 250

## 2020-03-23 NOTE — Patient Instructions (Signed)
[facility] Discharge Instructions for Patients Receiving Chemotherapy  Today you received the following chemotherapy agents trastuzumab.  To help prevent nausea and vomiting after your treatment, we encourage you to take your nausea medication as directed.    If you develop nausea and vomiting that is not controlled by your nausea medication, call the clinic.   BELOW ARE SYMPTOMS THAT SHOULD BE REPORTED IMMEDIATELY:  *FEVER GREATER THAN 100.5 F  *CHILLS WITH OR WITHOUT FEVER  NAUSEA AND VOMITING THAT IS NOT CONTROLLED WITH YOUR NAUSEA MEDICATION  *UNUSUAL SHORTNESS OF BREATH  *UNUSUAL BRUISING OR BLEEDING  TENDERNESS IN MOUTH AND THROAT WITH OR WITHOUT PRESENCE OF ULCERS  *URINARY PROBLEMS  *BOWEL PROBLEMS  UNUSUAL RASH Items with * indicate a potential emergency and should be followed up as soon as possible.  Feel free to call the clinic should you have any questions or concerns. The clinic phone number is [phone].  Please show the CHEMO ALERT CARD at check-in to the Emergency Department and triage nurse.   

## 2020-03-23 NOTE — Patient Instructions (Signed)

## 2020-03-28 ENCOUNTER — Telehealth: Payer: Self-pay | Admitting: Hematology and Oncology

## 2020-03-28 NOTE — Telephone Encounter (Signed)
Scheduled per 12/2 los. Pt will receive an updated appt calendar per next visit appt notes

## 2020-03-30 ENCOUNTER — Encounter: Payer: Self-pay | Admitting: *Deleted

## 2020-04-13 ENCOUNTER — Other Ambulatory Visit: Payer: Self-pay

## 2020-04-13 ENCOUNTER — Inpatient Hospital Stay: Payer: BC Managed Care – PPO

## 2020-04-13 VITALS — BP 139/67 | HR 87 | Temp 98.5°F | Resp 16

## 2020-04-13 DIAGNOSIS — C50412 Malignant neoplasm of upper-outer quadrant of left female breast: Secondary | ICD-10-CM | POA: Diagnosis not present

## 2020-04-13 DIAGNOSIS — Z95828 Presence of other vascular implants and grafts: Secondary | ICD-10-CM

## 2020-04-13 MED ORDER — ACETAMINOPHEN 325 MG PO TABS
650.0000 mg | ORAL_TABLET | Freq: Once | ORAL | Status: AC
  Administered 2020-04-13: 14:00:00 650 mg via ORAL

## 2020-04-13 MED ORDER — ACETAMINOPHEN 325 MG PO TABS
ORAL_TABLET | ORAL | Status: AC
  Filled 2020-04-13: qty 2

## 2020-04-13 MED ORDER — HEPARIN SOD (PORK) LOCK FLUSH 100 UNIT/ML IV SOLN
500.0000 [IU] | Freq: Once | INTRAVENOUS | Status: AC
  Administered 2020-04-13: 16:00:00 500 [IU]
  Filled 2020-04-13: qty 5

## 2020-04-13 MED ORDER — SODIUM CHLORIDE 0.9 % IV SOLN
Freq: Once | INTRAVENOUS | Status: AC
  Filled 2020-04-13: qty 250

## 2020-04-13 MED ORDER — DIPHENHYDRAMINE HCL 25 MG PO CAPS
ORAL_CAPSULE | ORAL | Status: AC
  Filled 2020-04-13: qty 2

## 2020-04-13 MED ORDER — TRASTUZUMAB-DKST CHEMO 150 MG IV SOLR
450.0000 mg | Freq: Once | INTRAVENOUS | Status: AC
  Administered 2020-04-13: 15:00:00 450 mg via INTRAVENOUS
  Filled 2020-04-13: qty 21.43

## 2020-04-13 MED ORDER — SODIUM CHLORIDE 0.9% FLUSH
10.0000 mL | Freq: Once | INTRAVENOUS | Status: AC
  Administered 2020-04-13: 10 mL
  Filled 2020-04-13: qty 10

## 2020-04-13 MED ORDER — DIPHENHYDRAMINE HCL 25 MG PO CAPS
50.0000 mg | ORAL_CAPSULE | Freq: Once | ORAL | Status: AC
  Administered 2020-04-13: 14:00:00 50 mg via ORAL

## 2020-05-03 NOTE — Progress Notes (Signed)
Patient Care Team: Antony Blackbird, MD (Inactive) as PCP - General (Family Medicine) Mauro Kaufmann, RN as Oncology Nurse Navigator Rockwell Germany, RN as Oncology Nurse Navigator Rolm Bookbinder, MD as Consulting Physician (General Surgery) Nicholas Lose, MD as Consulting Physician (Hematology and Oncology) Kyung Rudd, MD as Consulting Physician (Radiation Oncology)  DIAGNOSIS:    ICD-10-CM   1. Malignant neoplasm of upper-outer quadrant of left breast in female, estrogen receptor negative ([place])  C50.412    Z17.1     SUMMARY OF ONCOLOGIC HISTORY: Oncology History  Malignant neoplasm of upper-outer quadrant of left breast in female, estrogen receptor negative ([place])  07/27/2019 Initial Diagnosis   Screening mammogram showed a left breast asymmetry. Diagnostic mammogram and US showed a 1.2cm left breast mass, 12:30 position, no left axillary adenopathy. 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   08/05/2019 Surgery   Left lumpectomy: Grade 3 IDC, 2 cm with high-grade DCIS, lymphovascular invasion was identified, 1 lymph node negative, margins negative, ER 0%, PR 0%, HER-2 positive, Ki-67 90%   08/05/2019 Cancer Staging   Staging form: Breast, AJCC 8th Edition - Pathologic stage from 08/05/2019: Stage IIA (pT2, pN0, cM0, G3, ER-, PR-, HER2+) - Signed by Gardenia Phlegm, NP on 08/18/2019   08/30/2019 -  Chemotherapy   The patient had PACLitaxel (TAXOL) 138 mg in sodium chloride 0.9 % 250 mL chemo infusion (</= 44m/m2), 80 mg/m2 = 138 mg, Intravenous,  Once, 3 of 3 cycles Administration: 138 mg (08/30/2019), 138 mg (09/06/2019), 138 mg (09/30/2019), 138 mg (09/13/2019), 138 mg (09/22/2019), 138 mg (10/07/2019), 138 mg (10/15/2019), 138 mg (10/21/2019), 138 mg (10/28/2019), 138 mg (11/04/2019), 138 mg (11/11/2019), 138 mg (11/18/2019) trastuzumab-dkst (OGIVRI) 273 mg in sodium chloride 0.9 % 250 mL chemo infusion, 4 mg/kg = 273 mg, Intravenous,  Once, 9 of 16  cycles Dose modification: 6 mg/kg (original dose 2 mg/kg, Cycle 3, Reason: Other (see comments), Comment: begin maintenance trastuzumab) Administration: 273 mg (08/30/2019), 150 mg (09/06/2019), 150 mg (09/30/2019), 420 mg (12/30/2019), 420 mg (01/20/2020), 450 mg (03/23/2020), 450 mg (04/13/2020), 150 mg (09/13/2019), 150 mg (09/22/2019), 150 mg (10/07/2019), 150 mg (10/15/2019), 150 mg (10/21/2019), 150 mg (10/28/2019), 150 mg (11/04/2019), 150 mg (11/11/2019), 450 mg (11/18/2019), 420 mg (02/10/2020), 450 mg (03/03/2020)  for chemotherapy treatment.      CHIEF COMPLIANT: Follow-up for Herceptin maintenance  INTERVAL HISTORY: DCHESTER ROMEROis a 55y.o. with above-mentioned history of left breast cancerwhounderwent a lumpectomy,adjuvant chemotherapy, and is currently on adjuvant Herceptin maintenance.She is a participant in the neuropathy research study. She presents to the clinic todayfortreatment. She reports no adverse effects to Herceptin.   ALLERGIES:  is allergic to tape.  MEDICATIONS:  Current Outpatient Medications  Medication Sig Dispense Refill   ibuprofen (ADVIL) 200 MG tablet Take 400-600 mg by mouth every 8 (eight) hours as needed for moderate pain.  (Patient not taking: Reported on 12/30/2019)     lidocaine-prilocaine (EMLA) cream Apply to affected area once (Patient not taking: Reported on 12/30/2019) 30 g 3   Multiple Vitamin (MULTIVITAMIN WITH MINERALS) TABS tablet Take 1 tablet by mouth daily. One-A-Day for Women 50+ (Patient not taking: Reported on 12/30/2019)     No current facility-administered medications for this visit.    PHYSICAL EXAMINATION: ECOG PERFORMANCE STATUS: 1 - Symptomatic but completely ambulatory  Vitals:   05/04/20 1419  BP: 111/70  Pulse: 90  Resp: 16  Temp: 97.7 F (36.5 C)  SpO2:  98%   Filed Weights   05/04/20 1419  Weight: 163 lb 6.4 oz (74.1 kg)     LABORATORY DATA:  I have reviewed the data as listed CMP Latest Ref Rng & Units 05/04/2020  03/23/2020 02/10/2020  Glucose 70 - 99 mg/dL 98 94 93  BUN 6 - 20 mg/dL 19 23(H) 20  Creatinine 0.44 - 1.00 mg/dL 0.86 0.81 0.78  Sodium 135 - 145 mmol/L 141 140 142  Potassium 3.5 - 5.1 mmol/L 4.1 4.0 3.9  Chloride 98 - 111 mmol/L 108 107 108  CO2 22 - 32 mmol/L 23 23 26   Calcium 8.9 - 10.3 mg/dL 9.2 8.9 9.0  Total Protein 6.5 - 8.1 g/dL 7.4 6.9 6.8  Total Bilirubin 0.3 - 1.2 mg/dL 0.4 <0.2(L) 0.3  Alkaline Phos 38 - 126 U/L 94 91 87  AST 15 - 41 U/L 20 22 16   ALT 0 - 44 U/L 22 19 15     Lab Results  Component Value Date   WBC 6.4 05/04/2020   HGB 13.9 05/04/2020   HCT 41.5 05/04/2020   MCV 95.4 05/04/2020   PLT 237 05/04/2020   NEUTROABS 3.9 05/04/2020    ASSESSMENT & PLAN:  Malignant neoplasm of upper-outer quadrant of left breast in female, estrogen receptor negative ([place]) 07/27/2019:Screening mammogram showed a left breast asymmetry. Diagnostic mammogram and US showed a 1.2cm left breast mass, 12:30 position, no left axillary adenopathy. 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 T1c N0 stage Ia clinical stage  Recommendation: 1.08/05/2019:Left lumpectomy: Grade 3 IDC, 2 cm with high-grade DCIS, lymphovascular invasion was identified, 1 lymph node negative, margins negative, ER 0%, PR 0%, HER-2 positive, Ki-67 90% 2.Adjuvant chemotherapy with Taxol Herceptin followed by Herceptin maintenance for 1 yearstarted 5/10/2021completed 11/18/2019 3.Adjuvant radiation therapy9/21/2021-02/04/2020 Patient is participating inSWOG S 1714neuropathy clinical trial ------------------------------------------------------------------------------------------------------------------------------------------------------ Current treatment: Herceptin maintenance every 3 weeks Mild intermittent diarrhea Echocardiogram 03/13/2020: EF 55-60% Diffuse muscle body aches and pains especially in the joints: She is taking turmeric but so far it has not changed.   Instructed her to do yoga and stretching in the morning.  Return to clinic every 3 weeks for Herceptin every 6 weeks to follow-up with me and labs   No orders of the defined types were placed in this encounter.  The patient has a good understanding of the overall plan. she agrees with it. she will call with any problems that may develop before the next visit here.  Total time spent: 30 mins including face to face time and time spent for planning, charting and coordination of care  Nicholas Lose, MD 05/04/2020  I, Cloyde Reams Dorshimer, am acting as scribe for Dr. Nicholas Lose.  I have reviewed the above documentation for accuracy and completeness, and I agree with the above.

## 2020-05-04 ENCOUNTER — Inpatient Hospital Stay: Payer: BC Managed Care – PPO

## 2020-05-04 ENCOUNTER — Inpatient Hospital Stay: Payer: BC Managed Care – PPO | Admitting: Hematology and Oncology

## 2020-05-04 ENCOUNTER — Other Ambulatory Visit: Payer: Self-pay

## 2020-05-04 ENCOUNTER — Inpatient Hospital Stay: Payer: BC Managed Care – PPO | Attending: Hematology and Oncology

## 2020-05-04 DIAGNOSIS — C50412 Malignant neoplasm of upper-outer quadrant of left female breast: Secondary | ICD-10-CM | POA: Diagnosis not present

## 2020-05-04 DIAGNOSIS — Z79899 Other long term (current) drug therapy: Secondary | ICD-10-CM | POA: Insufficient documentation

## 2020-05-04 DIAGNOSIS — Z9221 Personal history of antineoplastic chemotherapy: Secondary | ICD-10-CM | POA: Diagnosis not present

## 2020-05-04 DIAGNOSIS — Z95828 Presence of other vascular implants and grafts: Secondary | ICD-10-CM

## 2020-05-04 DIAGNOSIS — Z171 Estrogen receptor negative status [ER-]: Secondary | ICD-10-CM

## 2020-05-04 DIAGNOSIS — Z5112 Encounter for antineoplastic immunotherapy: Secondary | ICD-10-CM | POA: Diagnosis not present

## 2020-05-04 DIAGNOSIS — Z923 Personal history of irradiation: Secondary | ICD-10-CM | POA: Insufficient documentation

## 2020-05-04 LAB — CMP (CANCER CENTER ONLY)
ALT: 22 U/L (ref 0–44)
AST: 20 U/L (ref 15–41)
Albumin: 3.9 g/dL (ref 3.5–5.0)
Alkaline Phosphatase: 94 U/L (ref 38–126)
Anion gap: 10 (ref 5–15)
BUN: 19 mg/dL (ref 6–20)
CO2: 23 mmol/L (ref 22–32)
Calcium: 9.2 mg/dL (ref 8.9–10.3)
Chloride: 108 mmol/L (ref 98–111)
Creatinine: 0.86 mg/dL (ref 0.44–1.00)
GFR, Estimated: >60 mL/min (ref >60)
Glucose, Bld: 98 mg/dL (ref 70–99)
Potassium: 4.1 mmol/L (ref 3.5–5.1)
Sodium: 141 mmol/L (ref 135–145)
Total Bilirubin: 0.4 mg/dL (ref 0.3–1.2)
Total Protein: 7.4 g/dL (ref 6.5–8.1)

## 2020-05-04 LAB — CBC WITH DIFFERENTIAL (CANCER CENTER ONLY)
Abs Immature Granulocytes: 0.03 10*3/uL (ref 0.00–0.07)
Basophils Absolute: 0 10*3/uL (ref 0.0–0.1)
Basophils Relative: 0 %
Eosinophils Absolute: 0.1 10*3/uL (ref 0.0–0.5)
Eosinophils Relative: 2 %
HCT: 41.5 % (ref 36.0–46.0)
Hemoglobin: 13.9 g/dL (ref 12.0–15.0)
Immature Granulocytes: 1 %
Lymphocytes Relative: 29 %
Lymphs Abs: 1.9 10*3/uL (ref 0.7–4.0)
MCH: 32 pg (ref 26.0–34.0)
MCHC: 33.5 g/dL (ref 30.0–36.0)
MCV: 95.4 fL (ref 80.0–100.0)
Monocytes Absolute: 0.5 10*3/uL (ref 0.1–1.0)
Monocytes Relative: 8 %
Neutro Abs: 3.9 10*3/uL (ref 1.7–7.7)
Neutrophils Relative %: 60 %
Platelet Count: 237 10*3/uL (ref 150–400)
RBC: 4.35 MIL/uL (ref 3.87–5.11)
RDW: 12.7 % (ref 11.5–15.5)
WBC Count: 6.4 10*3/uL (ref 4.0–10.5)
nRBC: 0 % (ref 0.0–0.2)

## 2020-05-04 MED ORDER — ACETAMINOPHEN 325 MG PO TABS
650.0000 mg | ORAL_TABLET | Freq: Once | ORAL | Status: AC
  Administered 2020-05-04: 650 mg via ORAL

## 2020-05-04 MED ORDER — SODIUM CHLORIDE 0.9% FLUSH
10.0000 mL | Freq: Once | INTRAVENOUS | Status: AC
  Administered 2020-05-04: 10 mL
  Filled 2020-05-04: qty 10

## 2020-05-04 MED ORDER — TRASTUZUMAB-DKST CHEMO 150 MG IV SOLR
450.0000 mg | Freq: Once | INTRAVENOUS | Status: AC
  Administered 2020-05-04: 450 mg via INTRAVENOUS
  Filled 2020-05-04: qty 21.43

## 2020-05-04 MED ORDER — HEPARIN SOD (PORK) LOCK FLUSH 100 UNIT/ML IV SOLN
500.0000 [IU] | Freq: Once | INTRAVENOUS | Status: AC | PRN
  Administered 2020-05-04: 500 [IU]
  Filled 2020-05-04: qty 5

## 2020-05-04 MED ORDER — SODIUM CHLORIDE 0.9% FLUSH
10.0000 mL | INTRAVENOUS | Status: DC | PRN
  Administered 2020-05-04: 10 mL
  Filled 2020-05-04: qty 10

## 2020-05-04 MED ORDER — DIPHENHYDRAMINE HCL 25 MG PO CAPS
50.0000 mg | ORAL_CAPSULE | Freq: Once | ORAL | Status: AC
Start: 2020-05-04 — End: 2020-05-04
  Administered 2020-05-04: 50 mg via ORAL

## 2020-05-04 MED ORDER — ACETAMINOPHEN 325 MG PO TABS
ORAL_TABLET | ORAL | Status: AC
  Filled 2020-05-04: qty 2

## 2020-05-04 MED ORDER — DIPHENHYDRAMINE HCL 25 MG PO CAPS
ORAL_CAPSULE | ORAL | Status: AC
  Filled 2020-05-04: qty 2

## 2020-05-04 MED ORDER — SODIUM CHLORIDE 0.9 % IV SOLN
Freq: Once | INTRAVENOUS | Status: AC
  Filled 2020-05-04: qty 250

## 2020-05-04 NOTE — Assessment & Plan Note (Signed)
07/27/2019:Screening mammogram showed a left breast asymmetry. Diagnostic mammogram and US showed a 1.2cm left breast mass, 12:30 position, no left axillary adenopathy. 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 T1c N0 stage Ia clinical stage  Recommendation: 1.08/05/2019:Left lumpectomy: Grade 3 IDC, 2 cm with high-grade DCIS, lymphovascular invasion was identified, 1 lymph node negative, margins negative, ER 0%, PR 0%, HER-2 positive, Ki-67 90% 2.Adjuvant chemotherapy with Taxol Herceptin followed by Herceptin maintenance for 1 yearstarted 5/10/2021completed 11/18/2019 3.Adjuvant radiation therapy9/21/2021-02/04/2020 Patient is participating inSWOG S 1714neuropathy clinical trial ------------------------------------------------------------------------------------------------------------------------------------------------------ Current treatment: Herceptin maintenance every 3 weeks Mild intermittent diarrhea Echocardiogram 03/13/2020: EF 55-60% Diffuse muscle body aches and pains especially in the joints:.  Return to clinic every 3 weeks for Herceptin every 6 weeks to follow-up with me and labs

## 2020-05-04 NOTE — Patient Instructions (Signed)
[facility] Discharge Instructions for Patients Receiving Chemotherapy  Today you received the following chemotherapy agents trastuzumab.  To help prevent nausea and vomiting after your treatment, we encourage you to take your nausea medication as directed.    If you develop nausea and vomiting that is not controlled by your nausea medication, call the clinic.   BELOW ARE SYMPTOMS THAT SHOULD BE REPORTED IMMEDIATELY:  *FEVER GREATER THAN 100.5 F  *CHILLS WITH OR WITHOUT FEVER  NAUSEA AND VOMITING THAT IS NOT CONTROLLED WITH YOUR NAUSEA MEDICATION  *UNUSUAL SHORTNESS OF BREATH  *UNUSUAL BRUISING OR BLEEDING  TENDERNESS IN MOUTH AND THROAT WITH OR WITHOUT PRESENCE OF ULCERS  *URINARY PROBLEMS  *BOWEL PROBLEMS  UNUSUAL RASH Items with * indicate a potential emergency and should be followed up as soon as possible.  Feel free to call the clinic should you have any questions or concerns. The clinic phone number is [phone].  Please show the CHEMO ALERT CARD at check-in to the Emergency Department and triage nurse.   

## 2020-05-08 ENCOUNTER — Telehealth: Payer: Self-pay | Admitting: Hematology and Oncology

## 2020-05-08 NOTE — Telephone Encounter (Signed)
Scheduled per 1/13 los. Pt will receive an updated appt calendar per next visit appt notes  

## 2020-05-25 ENCOUNTER — Telehealth: Payer: Self-pay | Admitting: Hematology and Oncology

## 2020-05-25 ENCOUNTER — Other Ambulatory Visit: Payer: Self-pay

## 2020-05-25 ENCOUNTER — Inpatient Hospital Stay: Payer: BC Managed Care – PPO | Attending: Hematology and Oncology

## 2020-05-25 VITALS — BP 137/83 | HR 88 | Temp 98.8°F | Resp 16 | Wt 168.2 lb

## 2020-05-25 DIAGNOSIS — Z5112 Encounter for antineoplastic immunotherapy: Secondary | ICD-10-CM | POA: Diagnosis present

## 2020-05-25 DIAGNOSIS — C50412 Malignant neoplasm of upper-outer quadrant of left female breast: Secondary | ICD-10-CM | POA: Diagnosis present

## 2020-05-25 DIAGNOSIS — Z171 Estrogen receptor negative status [ER-]: Secondary | ICD-10-CM | POA: Diagnosis not present

## 2020-05-25 MED ORDER — SODIUM CHLORIDE 0.9 % IV SOLN
Freq: Once | INTRAVENOUS | Status: AC
  Filled 2020-05-25: qty 250

## 2020-05-25 MED ORDER — TRASTUZUMAB-DKST CHEMO 150 MG IV SOLR
450.0000 mg | Freq: Once | INTRAVENOUS | Status: AC
  Administered 2020-05-25: 450 mg via INTRAVENOUS
  Filled 2020-05-25: qty 21.43

## 2020-05-25 MED ORDER — DIPHENHYDRAMINE HCL 25 MG PO CAPS
ORAL_CAPSULE | ORAL | Status: AC
  Filled 2020-05-25: qty 2

## 2020-05-25 MED ORDER — SODIUM CHLORIDE 0.9% FLUSH
10.0000 mL | INTRAVENOUS | Status: DC | PRN
  Administered 2020-05-25: 10 mL
  Filled 2020-05-25: qty 10

## 2020-05-25 MED ORDER — ACETAMINOPHEN 325 MG PO TABS
650.0000 mg | ORAL_TABLET | Freq: Once | ORAL | Status: AC
  Administered 2020-05-25: 650 mg via ORAL

## 2020-05-25 MED ORDER — HEPARIN SOD (PORK) LOCK FLUSH 100 UNIT/ML IV SOLN
500.0000 [IU] | Freq: Once | INTRAVENOUS | Status: AC | PRN
Start: 2020-05-25 — End: 2020-05-25
  Administered 2020-05-25: 500 [IU]
  Filled 2020-05-25: qty 5

## 2020-05-25 MED ORDER — ACETAMINOPHEN 325 MG PO TABS
ORAL_TABLET | ORAL | Status: AC
  Filled 2020-05-25: qty 2

## 2020-05-25 MED ORDER — DIPHENHYDRAMINE HCL 25 MG PO CAPS
50.0000 mg | ORAL_CAPSULE | Freq: Once | ORAL | Status: AC
  Administered 2020-05-25: 50 mg via ORAL

## 2020-05-25 NOTE — Telephone Encounter (Signed)
Rescheduled upcoming appointments. Patient is aware.

## 2020-05-25 NOTE — Patient Instructions (Signed)
[facility] Discharge Instructions for Patients Receiving Chemotherapy  Today you received the following immunotherapy agent: Trastuzumab  (OGIVRI)  To help prevent nausea and vomiting after your treatment, we encourage you to take your nausea medication as directed by your MD.   If you develop nausea and vomiting that is not controlled by your nausea medication, call the clinic.   BELOW ARE SYMPTOMS THAT SHOULD BE REPORTED IMMEDIATELY:  *FEVER GREATER THAN 100.5 F  *CHILLS WITH OR WITHOUT FEVER  NAUSEA AND VOMITING THAT IS NOT CONTROLLED WITH YOUR NAUSEA MEDICATION  *UNUSUAL SHORTNESS OF BREATH  *UNUSUAL BRUISING OR BLEEDING  TENDERNESS IN MOUTH AND THROAT WITH OR WITHOUT PRESENCE OF ULCERS  *URINARY PROBLEMS  *BOWEL PROBLEMS  UNUSUAL RASH Items with * indicate a potential emergency and should be followed up as soon as possible.  Feel free to call the clinic should you have any questions or concerns. The clinic phone number is [phone].  Please show the [place] at check-in to the Emergency Department and triage nurse.

## 2020-06-14 ENCOUNTER — Encounter: Payer: Self-pay | Admitting: *Deleted

## 2020-06-15 ENCOUNTER — Other Ambulatory Visit: Payer: BC Managed Care – PPO

## 2020-06-15 ENCOUNTER — Ambulatory Visit: Payer: BC Managed Care – PPO

## 2020-06-15 ENCOUNTER — Ambulatory Visit: Payer: BC Managed Care – PPO | Admitting: Hematology and Oncology

## 2020-06-21 ENCOUNTER — Other Ambulatory Visit: Payer: Self-pay | Admitting: *Deleted

## 2020-06-21 DIAGNOSIS — C50412 Malignant neoplasm of upper-outer quadrant of left female breast: Secondary | ICD-10-CM

## 2020-06-21 NOTE — Progress Notes (Signed)
Per MD request, RN placed orders for echocardiogram.  Apt scheduled.  RN attempt x1 to contact pt regarding apt date and time.  No answer, LVM.

## 2020-06-21 NOTE — Progress Notes (Signed)
Patient Care Team: Antony Blackbird, MD (Inactive) as PCP - General (Family Medicine) Mauro Kaufmann, RN as Oncology Nurse Navigator Rockwell Germany, RN as Oncology Nurse Navigator Rolm Bookbinder, MD as Consulting Physician (General Surgery) Nicholas Lose, MD as Consulting Physician (Hematology and Oncology) Kyung Rudd, MD as Consulting Physician (Radiation Oncology)  DIAGNOSIS:    ICD-10-CM   1. Malignant neoplasm of upper-outer quadrant of left breast in female, estrogen receptor negative ([place])  C50.412    Z17.1     SUMMARY OF ONCOLOGIC HISTORY: Oncology History  Malignant neoplasm of upper-outer quadrant of left breast in female, estrogen receptor negative ([place])  07/27/2019 Initial Diagnosis   Screening mammogram showed a left breast asymmetry. Diagnostic mammogram and US showed a 1.2cm left breast mass, 12:30 position, no left axillary adenopathy. 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   08/05/2019 Surgery   Left lumpectomy: Grade 3 IDC, 2 cm with high-grade DCIS, lymphovascular invasion was identified, 1 lymph node negative, margins negative, ER 0%, PR 0%, HER-2 positive, Ki-67 90%   08/05/2019 Cancer Staging   Staging form: Breast, AJCC 8th Edition - Pathologic stage from 08/05/2019: Stage IIA (pT2, pN0, cM0, G3, ER-, PR-, HER2+) - Signed by Gardenia Phlegm, NP on 08/18/2019   08/30/2019 -  Chemotherapy    Patient is on Treatment Plan: BREAST WEEKLY PACLITAXEL / TRASTUZUMAB / MAINTENANCE TRASTUZUMAB EVERY 21 DAYS        CHIEF COMPLIANT: Follow-up for Herceptin maintenance  INTERVAL HISTORY: [name] is a 55 y.o. with above-mentioned history of left breast cancerwhounderwent a lumpectomy,adjuvant chemotherapy, and is currently on adjuvant Herceptin maintenance.She is a participant in the neuropathy research study.She presents to the clinic todayfortreatment.  ALLERGIES:  is allergic to tape.  MEDICATIONS:  Current  Outpatient Medications  Medication Sig Dispense Refill  . ibuprofen (ADVIL) 200 MG tablet Take 400-600 mg by mouth every 8 (eight) hours as needed for moderate pain.  (Patient not taking: Reported on 12/30/2019)    . lidocaine-prilocaine (EMLA) cream Apply to affected area once (Patient not taking: Reported on 12/30/2019) 30 g 3  . Multiple Vitamin (MULTIVITAMIN WITH MINERALS) TABS tablet Take 1 tablet by mouth daily. One-A-Day for Women 50+ (Patient not taking: Reported on 12/30/2019)     No current facility-administered medications for this visit.    PHYSICAL EXAMINATION: ECOG PERFORMANCE STATUS: 1 - Symptomatic but completely ambulatory  Vitals:   06/22/20 0909  BP: 132/77  Pulse: 80  Resp: 20  Temp: 98.1 F (36.7 C)  SpO2: 98%   Filed Weights   06/22/20 0909  Weight: 162 lb (73.5 kg)    LABORATORY DATA:  I have reviewed the data as listed CMP Latest Ref Rng & Units 05/04/2020 03/23/2020 02/10/2020  Glucose 70 - 99 mg/dL 98 94 93  BUN 6 - 20 mg/dL 19 23(H) 20  Creatinine 0.44 - 1.00 mg/dL 0.86 0.81 0.78  Sodium 135 - 145 mmol/L 141 140 142  Potassium 3.5 - 5.1 mmol/L 4.1 4.0 3.9  Chloride 98 - 111 mmol/L 108 107 108  CO2 22 - 32 mmol/L _0 Calcium 8.9 - 10.3 mg/dL 9.2 8.9 9.0  Total Protein 6.5 - 8.1 g/dL 7.4 6.9 6.8  Total Bilirubin 0.3 - 1.2 mg/dL 0.4 <0.2(L) 0.3  Alkaline Phos 38 - 126 U/L 94 91 87  AST 15 - 41 U/L _1 ALT 0 - 44 U/L 22 19 15  Lab Results  Component Value Date   WBC 4.5 06/22/2020   HGB 14.5 06/22/2020   HCT 42.4 06/22/2020   MCV 94.0 06/22/2020   PLT 230 06/22/2020   NEUTROABS 2.4 06/22/2020    ASSESSMENT & PLAN:  Malignant neoplasm of upper-outer quadrant of left breast in female, estrogen receptor negative ([place]) 07/27/2019:Screening mammogram showed a left breast asymmetry. Diagnostic mammogram and US showed a 1.2cm left breast mass, 12:30 position, no left axillary adenopathy. Biopsy showed IDC with DCIS, grade 2, HER-2 + (3+),  ER/PR -, Ki67 90%. T1c N0 stage Ia clinical stage  Recommendation: 1.08/05/2019:Left lumpectomy: Grade 3 IDC, 2 cm with high-grade DCIS, lymphovascular invasion was identified, 1 lymph node negative, margins negative, ER 0%, PR 0%, HER-2 positive, Ki-67 90% 2.Adjuvant chemotherapy with Taxol Herceptin followed by Herceptin maintenance for 1 yearstarted 5/10/2021completed 11/18/2019 3.Adjuvant radiation therapy9/21/2021-02/04/2020 Patient is participating inSWOG S 1714neuropathy clinical trial ------------------------------------------------------------------------------------------------------------------------------------------------------ Current treatment: Herceptin maintenance every 3 weeks Mild intermittent diarrhea Echocardiogram11/22/2021: EF55-60% Diffuse muscle body aches and pains especially in the joints: She is taking turmeric but so far it has not changed.  Instructed her to do yoga and stretching in the morning. Echo has been scheduled.  She has 3 more Herceptin treatments left.  Her labs have been excellent.  Therefore I will not order any more labs.  I will see her on the last day of the treatment.    No orders of the defined types were placed in this encounter.  The patient has a good understanding of the overall plan. she agrees with it. she will call with any problems that may develop before the next visit here.  Total time spent: 30 mins including face to face time and time spent for planning, charting and coordination of care  Rulon Eisenmenger, MD, MPH 06/22/2020  I, Cloyde Reams Dorshimer, am acting as scribe for Dr. Nicholas Lose.  I have reviewed the above documentation for accuracy and completeness, and I agree with the above.

## 2020-06-22 ENCOUNTER — Inpatient Hospital Stay: Payer: BC Managed Care – PPO | Attending: Hematology and Oncology

## 2020-06-22 ENCOUNTER — Inpatient Hospital Stay: Payer: BC Managed Care – PPO | Admitting: Hematology and Oncology

## 2020-06-22 ENCOUNTER — Inpatient Hospital Stay: Payer: BC Managed Care – PPO

## 2020-06-22 ENCOUNTER — Other Ambulatory Visit: Payer: Self-pay

## 2020-06-22 DIAGNOSIS — C50412 Malignant neoplasm of upper-outer quadrant of left female breast: Secondary | ICD-10-CM

## 2020-06-22 DIAGNOSIS — Z923 Personal history of irradiation: Secondary | ICD-10-CM | POA: Insufficient documentation

## 2020-06-22 DIAGNOSIS — Z5112 Encounter for antineoplastic immunotherapy: Secondary | ICD-10-CM | POA: Insufficient documentation

## 2020-06-22 DIAGNOSIS — Z9221 Personal history of antineoplastic chemotherapy: Secondary | ICD-10-CM | POA: Diagnosis not present

## 2020-06-22 DIAGNOSIS — Z171 Estrogen receptor negative status [ER-]: Secondary | ICD-10-CM | POA: Diagnosis not present

## 2020-06-22 DIAGNOSIS — Z95828 Presence of other vascular implants and grafts: Secondary | ICD-10-CM

## 2020-06-22 LAB — CMP (CANCER CENTER ONLY)
ALT: 20 U/L (ref 0–44)
AST: 18 U/L (ref 15–41)
Albumin: 3.9 g/dL (ref 3.5–5.0)
Alkaline Phosphatase: 88 U/L (ref 38–126)
Anion gap: 8 (ref 5–15)
BUN: 21 mg/dL — ABNORMAL HIGH (ref 6–20)
CO2: 24 mmol/L (ref 22–32)
Calcium: 8.7 mg/dL — ABNORMAL LOW (ref 8.9–10.3)
Chloride: 106 mmol/L (ref 98–111)
Creatinine: 0.83 mg/dL (ref 0.44–1.00)
GFR, Estimated: >60 mL/min (ref >60)
Glucose, Bld: 92 mg/dL (ref 70–99)
Potassium: 4 mmol/L (ref 3.5–5.1)
Sodium: 138 mmol/L (ref 135–145)
Total Bilirubin: 0.4 mg/dL (ref 0.3–1.2)
Total Protein: 7 g/dL (ref 6.5–8.1)

## 2020-06-22 LAB — CBC WITH DIFFERENTIAL (CANCER CENTER ONLY)
Abs Immature Granulocytes: 0.02 10*3/uL (ref 0.00–0.07)
Basophils Absolute: 0 10*3/uL (ref 0.0–0.1)
Basophils Relative: 0 %
Eosinophils Absolute: 0.1 10*3/uL (ref 0.0–0.5)
Eosinophils Relative: 3 %
HCT: 42.4 % (ref 36.0–46.0)
Hemoglobin: 14.5 g/dL (ref 12.0–15.0)
Immature Granulocytes: 0 %
Lymphocytes Relative: 36 %
Lymphs Abs: 1.6 10*3/uL (ref 0.7–4.0)
MCH: 32.2 pg (ref 26.0–34.0)
MCHC: 34.2 g/dL (ref 30.0–36.0)
MCV: 94 fL (ref 80.0–100.0)
Monocytes Absolute: 0.4 10*3/uL (ref 0.1–1.0)
Monocytes Relative: 8 %
Neutro Abs: 2.4 10*3/uL (ref 1.7–7.7)
Neutrophils Relative %: 53 %
Platelet Count: 230 10*3/uL (ref 150–400)
RBC: 4.51 MIL/uL (ref 3.87–5.11)
RDW: 12.2 % (ref 11.5–15.5)
WBC Count: 4.5 10*3/uL (ref 4.0–10.5)
nRBC: 0 % (ref 0.0–0.2)

## 2020-06-22 MED ORDER — SODIUM CHLORIDE 0.9 % IV SOLN
Freq: Once | INTRAVENOUS | Status: AC
  Filled 2020-06-22: qty 250

## 2020-06-22 MED ORDER — HEPARIN SOD (PORK) LOCK FLUSH 100 UNIT/ML IV SOLN
500.0000 [IU] | Freq: Once | INTRAVENOUS | Status: AC | PRN
Start: 2020-06-22 — End: 2020-06-22
  Administered 2020-06-22: 500 [IU]
  Filled 2020-06-22: qty 5

## 2020-06-22 MED ORDER — SODIUM CHLORIDE 0.9% FLUSH
10.0000 mL | INTRAVENOUS | Status: DC | PRN
  Administered 2020-06-22: 10 mL
  Filled 2020-06-22: qty 10

## 2020-06-22 MED ORDER — ACETAMINOPHEN 325 MG PO TABS
650.0000 mg | ORAL_TABLET | Freq: Once | ORAL | Status: AC
  Administered 2020-06-22: 650 mg via ORAL

## 2020-06-22 MED ORDER — TRASTUZUMAB-DKST CHEMO 150 MG IV SOLR
450.0000 mg | Freq: Once | INTRAVENOUS | Status: AC
  Administered 2020-06-22: 450 mg via INTRAVENOUS
  Filled 2020-06-22: qty 21.43

## 2020-06-22 MED ORDER — ACETAMINOPHEN 325 MG PO TABS
ORAL_TABLET | ORAL | Status: AC
  Filled 2020-06-22: qty 2

## 2020-06-22 MED ORDER — SODIUM CHLORIDE 0.9% FLUSH
10.0000 mL | Freq: Once | INTRAVENOUS | Status: AC
  Administered 2020-06-22: 10 mL
  Filled 2020-06-22: qty 10

## 2020-06-22 NOTE — Patient Instructions (Signed)
[facility] Discharge Instructions for Patients Receiving Chemotherapy  Today you received the following chemotherapy agents trastuzumab.  To help prevent nausea and vomiting after your treatment, we encourage you to take your nausea medication as directed.    If you develop nausea and vomiting that is not controlled by your nausea medication, call the clinic.   BELOW ARE SYMPTOMS THAT SHOULD BE REPORTED IMMEDIATELY:  *FEVER GREATER THAN 100.5 F  *CHILLS WITH OR WITHOUT FEVER  NAUSEA AND VOMITING THAT IS NOT CONTROLLED WITH YOUR NAUSEA MEDICATION  *UNUSUAL SHORTNESS OF BREATH  *UNUSUAL BRUISING OR BLEEDING  TENDERNESS IN MOUTH AND THROAT WITH OR WITHOUT PRESENCE OF ULCERS  *URINARY PROBLEMS  *BOWEL PROBLEMS  UNUSUAL RASH Items with * indicate a potential emergency and should be followed up as soon as possible.  Feel free to call the clinic should you have any questions or concerns. The clinic phone number is [phone].  Please show the CHEMO ALERT CARD at check-in to the Emergency Department and triage nurse.   

## 2020-06-22 NOTE — Assessment & Plan Note (Signed)
07/27/2019:Screening mammogram showed a left breast asymmetry. Diagnostic mammogram and US showed a 1.2cm left breast mass, 12:30 position, no left axillary adenopathy. 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 T1c N0 stage Ia clinical stage  Recommendation: 1.08/05/2019:Left lumpectomy: Grade 3 IDC, 2 cm with high-grade DCIS, lymphovascular invasion was identified, 1 lymph node negative, margins negative, ER 0%, PR 0%, HER-2 positive, Ki-67 90% 2.Adjuvant chemotherapy with Taxol Herceptin followed by Herceptin maintenance for 1 yearstarted 5/10/2021completed 11/18/2019 3.Adjuvant radiation therapy9/21/2021-02/04/2020 Patient is participating inSWOG S 1714neuropathy clinical trial ------------------------------------------------------------------------------------------------------------------------------------------------------ Current treatment: Herceptin maintenance every 3 weeks Mild intermittent diarrhea Echocardiogram11/22/2021: EF55-60% Diffuse muscle body aches and pains especially in the joints: She is taking turmeric but so far it has not changed.  Instructed her to do yoga and stretching in the morning.  Return to clinic every 3 weeks for Herceptin every 6 weeks to follow-up with me and labs

## 2020-06-22 NOTE — Patient Instructions (Signed)

## 2020-06-23 ENCOUNTER — Telehealth: Payer: Self-pay | Admitting: Hematology and Oncology

## 2020-06-23 NOTE — Telephone Encounter (Signed)
Scheduled per 3/3 los. Pt will receive an updated appt calendar per next visit appt notes

## 2020-06-27 ENCOUNTER — Other Ambulatory Visit (HOSPITAL_COMMUNITY): Payer: BC Managed Care – PPO

## 2020-06-27 ENCOUNTER — Other Ambulatory Visit: Payer: Self-pay

## 2020-06-27 ENCOUNTER — Ambulatory Visit (HOSPITAL_COMMUNITY)
Admission: RE | Admit: 2020-06-27 | Discharge: 2020-06-27 | Disposition: A | Discharge status: Home / Self Care | Payer: BC Managed Care – PPO | Source: Ambulatory Visit | Attending: Hematology and Oncology | Admitting: Hematology and Oncology

## 2020-06-27 DIAGNOSIS — C50412 Malignant neoplasm of upper-outer quadrant of left female breast: Secondary | ICD-10-CM

## 2020-06-27 DIAGNOSIS — Z171 Estrogen receptor negative status [ER-]: Secondary | ICD-10-CM | POA: Diagnosis not present

## 2020-06-27 LAB — ECHOCARDIOGRAM COMPLETE
Area-P 1/2: 4.21 cm2
Calc EF: 63.9 %
S' Lateral: 3.4 cm
Single Plane A2C EF: 67.7 %
Single Plane A4C EF: 60 %

## 2020-06-27 NOTE — Progress Notes (Signed)
  Echocardiogram 2D Echocardiogram has been performed.  [name] 06/27/2020, 8:56 AM

## 2020-07-03 ENCOUNTER — Telehealth: Payer: Self-pay

## 2020-07-03 MED ORDER — CEPHALEXIN 500 MG PO CAPS: 500.0000 mg | ORAL_CAPSULE | Freq: Two times a day (BID) | ORAL | 0 refills | Status: AC

## 2020-07-03 NOTE — Telephone Encounter (Signed)
Patient called to report redness, tenderness to left breast.    Patient with history of left breast cancer, lumpectomy.    Patient states she has been doing more yard work over the past week.  Pt denies any fever, however, site of left breast feels warm to the touch.  Denies any lumps/nodule.   No streaking, or drainage.     RN reviewed with MD - MD recommendations for patient to take picture for comparison.  Verbal orders given for Keflex 500mg  BID X 7 days.  Follow up in one week.    Pt verbalized understanding and agreement.  Pt scheduled for infusion next week, will place on MD schedule prior.  RN encouraged if symptoms resolve or worsen to notify clinic.  OK to cancel MD appointment if symptoms resolve.

## 2020-07-06 ENCOUNTER — Ambulatory Visit: Payer: BC Managed Care – PPO

## 2020-07-12 ENCOUNTER — Encounter: Payer: Self-pay | Admitting: *Deleted

## 2020-07-12 NOTE — Assessment & Plan Note (Signed)
07/27/2019:Screening mammogram showed a left breast asymmetry. Diagnostic mammogram and US showed a 1.2cm left breast mass, 12:30 position, no left axillary adenopathy. 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 T1c N0 stage Ia clinical stage  Recommendation: 1.08/05/2019:Left lumpectomy: Grade 3 IDC, 2 cm with high-grade DCIS, lymphovascular invasion was identified, 1 lymph node negative, margins negative, ER 0%, PR 0%, HER-2 positive, Ki-67 90% 2.Adjuvant chemotherapy with Taxol Herceptin followed by Herceptin maintenance for 1 yearstarted 5/10/2021completed 11/18/2019 3.Adjuvant radiation therapy9/21/2021-02/04/2020 Patient is participating inSWOG S 1714neuropathy clinical trial ------------------------------------------------------------------------------------------------------------------------------------------------------ Current treatment: Herceptin maintenance every 3 weeks Mild intermittent diarrhea Echocardiogram11/22/2021: EF55-60% Diffuse muscle body aches and pains especially in the joints:She is taking turmeric but so far it has not changed. Instructed her to do yoga and stretching in the morning. Echo has been scheduled.  Today is the last herceptin.  Breast Cancer Surveillance: To be done in April 2022 

## 2020-07-12 NOTE — Progress Notes (Signed)
Patient Care Team: Antony Blackbird, MD (Inactive) as PCP - General (Family Medicine) Mauro Kaufmann, RN as Oncology Nurse Navigator Rockwell Germany, RN as Oncology Nurse Navigator Rolm Bookbinder, MD as Consulting Physician (General Surgery) Nicholas Lose, MD as Consulting Physician (Hematology and Oncology) Kyung Rudd, MD as Consulting Physician (Radiation Oncology)  DIAGNOSIS:    ICD-10-CM   1. Malignant neoplasm of upper-outer quadrant of left breast in female, estrogen receptor negative ([place])  C50.412    Z17.1     SUMMARY OF ONCOLOGIC HISTORY: Oncology History  Malignant neoplasm of upper-outer quadrant of left breast in female, estrogen receptor negative ([place])  07/27/2019 Initial Diagnosis   Screening mammogram showed a left breast asymmetry. Diagnostic mammogram and US showed a 1.2cm left breast mass, 12:30 position, no left axillary adenopathy. 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   08/05/2019 Surgery   Left lumpectomy: Grade 3 IDC, 2 cm with high-grade DCIS, lymphovascular invasion was identified, 1 lymph node negative, margins negative, ER 0%, PR 0%, HER-2 positive, Ki-67 90%   08/05/2019 Cancer Staging   Staging form: Breast, AJCC 8th Edition - Pathologic stage from 08/05/2019: Stage IIA (pT2, pN0, cM0, G3, ER-, PR-, HER2+) - Signed by Gardenia Phlegm, NP on 08/18/2019   08/30/2019 -  Chemotherapy    Patient is on Treatment Plan: BREAST WEEKLY PACLITAXEL / TRASTUZUMAB / MAINTENANCE TRASTUZUMAB EVERY 21 DAYS        CHIEF COMPLIANT: Follow-up for Herceptin maintenance  INTERVAL HISTORY: [name] is a 55 y.o. with above-mentioned history of left breast cancerwhounderwent a lumpectomy,adjuvant chemotherapy, and is currently on adjuvant Herceptin maintenance.She is a participant in the neuropathy research study.Echo on 06/27/20 showed an ejection fraction of 55-60%. She presents to the clinic todayfortreatment.  She  noticed profound the redness and swelling of her right breast.  This happened soon after she was working in the sun and was moving some broken branches from a fallen tree.  It appears to be getting better.  ALLERGIES:  is allergic to tape.  MEDICATIONS:  Current Outpatient Medications  Medication Sig Dispense Refill  . ibuprofen (ADVIL) 200 MG tablet Take 400-600 mg by mouth every 8 (eight) hours as needed for moderate pain.  (Patient not taking: Reported on 12/30/2019)    . lidocaine-prilocaine (EMLA) cream Apply to affected area once (Patient not taking: Reported on 12/30/2019) 30 g 3  . Multiple Vitamin (MULTIVITAMIN WITH MINERALS) TABS tablet Take 1 tablet by mouth daily. One-A-Day for Women 50+ (Patient not taking: Reported on 12/30/2019)     No current facility-administered medications for this visit.    PHYSICAL EXAMINATION: ECOG PERFORMANCE STATUS: 1 - Symptomatic but completely ambulatory  Vitals:   07/13/20 1331  BP: 127/84  Pulse: 99  Resp: 18  Temp: 98.2 F (36.8 C)  SpO2: 100%   Filed Weights   07/13/20 1331  Weight: 161 lb 9.6 oz (73.3 kg)    LABORATORY DATA:  I have reviewed the data as listed CMP Latest Ref Rng & Units 06/22/2020 05/04/2020 03/23/2020  Glucose 70 - 99 mg/dL 92 98 94  BUN 6 - 20 mg/dL 21(H) 19 23(H)  Creatinine 0.44 - 1.00 mg/dL 0.83 0.86 0.81  Sodium 135 - 145 mmol/L 138 141 140  Potassium 3.5 - 5.1 mmol/L 4.0 4.1 4.0  Chloride 98 - 111 mmol/L 106 108 107  CO2 22 - 32 mmol/L 24 23 23   Calcium 8.9 - 10.3 mg/dL 8.7(L) 9.2  8.9  Total Protein 6.5 - 8.1 g/dL 7.0 7.4 6.9  Total Bilirubin 0.3 - 1.2 mg/dL 0.4 0.4 <0.2(L)  Alkaline Phos 38 - 126 U/L 88 94 91  AST 15 - 41 U/L 18 20 22   ALT 0 - 44 U/L 20 22 19     Lab Results  Component Value Date   WBC 4.5 06/22/2020   HGB 14.5 06/22/2020   HCT 42.4 06/22/2020   MCV 94.0 06/22/2020   PLT 230 06/22/2020   NEUTROABS 2.4 06/22/2020    ASSESSMENT & PLAN:  Malignant neoplasm of upper-outer quadrant  of left breast in female, estrogen receptor negative ([place]) 07/27/2019:Screening mammogram showed a left breast asymmetry. Diagnostic mammogram and US showed a 1.2cm left breast mass, 12:30 position, no left axillary adenopathy. 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 T1c N0 stage Ia clinical stage  Recommendation: 1.08/05/2019:Left lumpectomy: Grade 3 IDC, 2 cm with high-grade DCIS, lymphovascular invasion was identified, 1 lymph node negative, margins negative, ER 0%, PR 0%, HER-2 positive, Ki-67 90% 2.Adjuvant chemotherapy with Taxol Herceptin followed by Herceptin maintenance for 1 yearstarted 5/10/2021completed 11/18/2019 3.Adjuvant radiation therapy9/21/2021-02/04/2020 Patient is participating inSWOG S 1714neuropathy clinical trial ------------------------------------------------------------------------------------------------------------------------------------------------------ Current treatment: Herceptin maintenance every 3 weeks last Herceptin on 08/24/2020) Mild intermittent diarrhea Echocardiogram11/22/2021: EF55-60%   Redness of the left breast: I suspect this is related to radiation recall.  She was in the yard working in the sun when this happened.  It appears to be getting better.  She was given antibiotics but her symptoms did not improve until last few days.   Surveillance: Recommended that she get a mammogram in the next couple of weeks.  She is due for her annual checkup anyway.  Return to clinic every 3 weeks for Herceptin and I will see her with her last Herceptin treatment.    No orders of the defined types were placed in this encounter.  The patient has a good understanding of the overall plan. she agrees with it. she will call with any problems that may develop before the next visit here.  Total time spent: 30 mins including face to face time and time spent for planning, charting and coordination of care  Rulon Eisenmenger,  MD, MPH 07/13/2020  I, Molly Dorshimer, am acting as scribe for Dr. Nicholas Lose.  I have reviewed the above documentation for accuracy and completeness, and I agree with the above.

## 2020-07-13 ENCOUNTER — Inpatient Hospital Stay: Payer: BC Managed Care – PPO

## 2020-07-13 ENCOUNTER — Inpatient Hospital Stay: Payer: BC Managed Care – PPO | Admitting: Hematology and Oncology

## 2020-07-13 ENCOUNTER — Other Ambulatory Visit: Payer: Self-pay

## 2020-07-13 DIAGNOSIS — C50412 Malignant neoplasm of upper-outer quadrant of left female breast: Secondary | ICD-10-CM

## 2020-07-13 DIAGNOSIS — Z171 Estrogen receptor negative status [ER-]: Secondary | ICD-10-CM | POA: Diagnosis not present

## 2020-07-13 MED ORDER — TRASTUZUMAB-DKST CHEMO 150 MG IV SOLR
6.0000 mg/kg | Freq: Once | INTRAVENOUS | Status: AC
  Administered 2020-07-13: 420 mg via INTRAVENOUS
  Filled 2020-07-13: qty 20

## 2020-07-13 MED ORDER — SODIUM CHLORIDE 0.9 % IV SOLN
Freq: Once | INTRAVENOUS | Status: AC
  Filled 2020-07-13: qty 250

## 2020-07-13 MED ORDER — ACETAMINOPHEN 325 MG PO TABS
650.0000 mg | ORAL_TABLET | Freq: Once | ORAL | Status: AC
  Administered 2020-07-13: 650 mg via ORAL

## 2020-07-13 MED ORDER — SODIUM CHLORIDE 0.9% FLUSH
10.0000 mL | INTRAVENOUS | Status: DC | PRN
  Administered 2020-07-13: 10 mL
  Filled 2020-07-13: qty 10

## 2020-07-13 MED ORDER — ACETAMINOPHEN 325 MG PO TABS
ORAL_TABLET | ORAL | Status: AC
  Filled 2020-07-13: qty 2

## 2020-07-13 MED ORDER — HEPARIN SOD (PORK) LOCK FLUSH 100 UNIT/ML IV SOLN
500.0000 [IU] | Freq: Once | INTRAVENOUS | Status: AC | PRN
  Administered 2020-07-13: 500 [IU]
  Filled 2020-07-13: qty 5

## 2020-07-13 NOTE — Patient Instructions (Signed)
[facility] Discharge Instructions for Patients Receiving Chemotherapy  Today you received the following chemotherapy agents trastuzumab.  To help prevent nausea and vomiting after your treatment, we encourage you to take your nausea medication as directed.    If you develop nausea and vomiting that is not controlled by your nausea medication, call the clinic.   BELOW ARE SYMPTOMS THAT SHOULD BE REPORTED IMMEDIATELY:  *FEVER GREATER THAN 100.5 F  *CHILLS WITH OR WITHOUT FEVER  NAUSEA AND VOMITING THAT IS NOT CONTROLLED WITH YOUR NAUSEA MEDICATION  *UNUSUAL SHORTNESS OF BREATH  *UNUSUAL BRUISING OR BLEEDING  TENDERNESS IN MOUTH AND THROAT WITH OR WITHOUT PRESENCE OF ULCERS  *URINARY PROBLEMS  *BOWEL PROBLEMS  UNUSUAL RASH Items with * indicate a potential emergency and should be followed up as soon as possible.  Feel free to call the clinic should you have any questions or concerns. The clinic phone number is [phone].  Please show the CHEMO ALERT CARD at check-in to the Emergency Department and triage nurse.   

## 2020-07-19 ENCOUNTER — Other Ambulatory Visit: Payer: Self-pay | Admitting: Hematology and Oncology

## 2020-07-19 DIAGNOSIS — C50412 Malignant neoplasm of upper-outer quadrant of left female breast: Secondary | ICD-10-CM

## 2020-08-03 ENCOUNTER — Telehealth: Payer: Self-pay | Admitting: Hematology and Oncology

## 2020-08-03 ENCOUNTER — Inpatient Hospital Stay: Payer: BC Managed Care – PPO

## 2020-08-03 NOTE — Telephone Encounter (Signed)
Cancelled 04/14 infusion appointment due to patient not feeling well. Sent message to reschedule infusion appointment.

## 2020-08-03 NOTE — Telephone Encounter (Signed)
Rescheduled 04/14 appointment to 04/26 per patient's request. Infusion date approved by my chart.

## 2020-08-15 ENCOUNTER — Other Ambulatory Visit: Payer: Self-pay

## 2020-08-15 ENCOUNTER — Inpatient Hospital Stay: Payer: BC Managed Care – PPO | Attending: Hematology and Oncology

## 2020-08-15 VITALS — BP 127/73 | HR 83 | Temp 99.1°F | Resp 18 | Wt 164.2 lb

## 2020-08-15 DIAGNOSIS — Z923 Personal history of irradiation: Secondary | ICD-10-CM | POA: Insufficient documentation

## 2020-08-15 DIAGNOSIS — Z9221 Personal history of antineoplastic chemotherapy: Secondary | ICD-10-CM | POA: Diagnosis not present

## 2020-08-15 DIAGNOSIS — Z5112 Encounter for antineoplastic immunotherapy: Secondary | ICD-10-CM | POA: Insufficient documentation

## 2020-08-15 DIAGNOSIS — Z171 Estrogen receptor negative status [ER-]: Secondary | ICD-10-CM | POA: Insufficient documentation

## 2020-08-15 DIAGNOSIS — C50412 Malignant neoplasm of upper-outer quadrant of left female breast: Secondary | ICD-10-CM | POA: Diagnosis present

## 2020-08-15 MED ORDER — TRASTUZUMAB-DKST CHEMO 150 MG IV SOLR
6.0000 mg/kg | Freq: Once | INTRAVENOUS | Status: AC
  Administered 2020-08-15: 420 mg via INTRAVENOUS
  Filled 2020-08-15: qty 20

## 2020-08-15 MED ORDER — SODIUM CHLORIDE 0.9 % IV SOLN
Freq: Once | INTRAVENOUS | Status: AC
Start: 2020-08-15 — End: 2020-08-15
  Filled 2020-08-15: qty 250

## 2020-08-15 MED ORDER — ACETAMINOPHEN 325 MG PO TABS
650.0000 mg | ORAL_TABLET | Freq: Once | ORAL | Status: AC
Start: 2020-08-15 — End: 2020-08-15
  Administered 2020-08-15: 650 mg via ORAL

## 2020-08-15 MED ORDER — ACETAMINOPHEN 325 MG PO TABS
ORAL_TABLET | ORAL | Status: AC
  Filled 2020-08-15: qty 2

## 2020-08-15 MED ORDER — SODIUM CHLORIDE 0.9% FLUSH
10.0000 mL | INTRAVENOUS | Status: DC | PRN
  Administered 2020-08-15: 10 mL
  Filled 2020-08-15: qty 10

## 2020-08-15 MED ORDER — HEPARIN SOD (PORK) LOCK FLUSH 100 UNIT/ML IV SOLN
500.0000 [IU] | Freq: Once | INTRAVENOUS | Status: AC | PRN
  Administered 2020-08-15: 500 [IU]
  Filled 2020-08-15: qty 5

## 2020-08-15 NOTE — Patient Instructions (Signed)
[facility] MEDICAL ONCOLOGY  Discharge Instructions: Thank you for choosing [facility] to provide your oncology and hematology care.   If you have a lab appointment with the Cancer Center, please go directly to the Cancer Center and check in at the registration area.   Wear comfortable clothing and clothing appropriate for easy access to any Portacath or PICC line.   We strive to give you quality time with your provider. You may need to reschedule your appointment if you arrive late (15 or more minutes).  Arriving late affects you and other patients whose appointments are after yours.  Also, if you miss three or more appointments without notifying the office, you may be dismissed from the clinic at the provider's discretion.      For prescription refill requests, have your pharmacy contact our office and allow 72 hours for refills to be completed.    Today you received the following chemotherapy and/or immunotherapy agents trastuzumab      To help prevent nausea and vomiting after your treatment, we encourage you to take your nausea medication as directed.  BELOW ARE SYMPTOMS THAT SHOULD BE REPORTED IMMEDIATELY: *FEVER GREATER THAN 100.4 F (38 C) OR HIGHER *CHILLS OR SWEATING *NAUSEA AND VOMITING THAT IS NOT CONTROLLED WITH YOUR NAUSEA MEDICATION *UNUSUAL SHORTNESS OF BREATH *UNUSUAL BRUISING OR BLEEDING *URINARY PROBLEMS (pain or burning when urinating, or frequent urination) *BOWEL PROBLEMS (unusual diarrhea, constipation, pain near the anus) TENDERNESS IN MOUTH AND THROAT WITH OR WITHOUT PRESENCE OF ULCERS (sore throat, sores in mouth, or a toothache) UNUSUAL RASH, SWELLING OR PAIN  UNUSUAL VAGINAL DISCHARGE OR ITCHING   Items with * indicate a potential emergency and should be followed up as soon as possible or go to the Emergency Department if any problems should occur.  Please show the CHEMOTHERAPY ALERT CARD or IMMUNOTHERAPY ALERT CARD at check-in to  the Emergency Department and triage nurse.  Should you have questions after your visit or need to cancel or reschedule your appointment, please contact [facility] MEDICAL [facility]COLOG[facility]  Dept: [phone]  and follow the prompts.  Office hours are 8:00 a.m. to 4:30 p.m. Monday - Friday. Please note that voicemails left after 4:00 p.m. may not be returned until the following business day.  We are closed weekends and major holidays. You have access to a nurse at all times for urgent questions. Please call the main number to the clinic Dept: [phone] and follow the prompts.   For any non-urgent questions, you may also contact your provider using MyChart. We now offer e-Visits for anyone 18 and older to request care online for non-urgent symptoms. For details visit [email or web address].   Also download the MyChart app! Go to the app store, search "MyChart", open the app, select [facility], and log in with your MyChart username and password.  Due to Covid, a mask is required upon entering the hospital/clinic. If you do not have a mask, one will be given to you upon arrival. For doctor visits, patients may have 1 support person aged 18 or older with them. For treatment visits, patients cannot have anyone with them due to current Covid guidelines and our immunocompromised population.   

## 2020-08-22 ENCOUNTER — Telehealth: Payer: Self-pay | Admitting: Hematology and Oncology

## 2020-08-22 NOTE — Telephone Encounter (Signed)
R/s app per 4/29 sch msg. Pt aware.

## 2020-08-24 ENCOUNTER — Encounter: Payer: Self-pay | Admitting: *Deleted

## 2020-08-24 ENCOUNTER — Inpatient Hospital Stay: Payer: BC Managed Care – PPO

## 2020-08-24 ENCOUNTER — Inpatient Hospital Stay: Payer: BC Managed Care – PPO | Admitting: Hematology and Oncology

## 2020-08-30 ENCOUNTER — Ambulatory Visit
Admission: RE | Admit: 2020-08-30 | Discharge: 2020-08-30 | Disposition: A | Discharge status: Home / Self Care | Payer: BC Managed Care – PPO | Source: Ambulatory Visit | Attending: Hematology and Oncology | Admitting: Hematology and Oncology

## 2020-08-30 ENCOUNTER — Other Ambulatory Visit: Payer: Self-pay

## 2020-08-30 ENCOUNTER — Ambulatory Visit: Payer: BC Managed Care – PPO

## 2020-08-30 DIAGNOSIS — C50412 Malignant neoplasm of upper-outer quadrant of left female breast: Secondary | ICD-10-CM

## 2020-08-30 DIAGNOSIS — Z171 Estrogen receptor negative status [ER-]: Secondary | ICD-10-CM

## 2020-09-04 NOTE — Progress Notes (Signed)
**Sherri Sherri** Patient Care Team: Antony Blackbird, MD (Inactive) as PCP - General (Family Medicine) Mauro Kaufmann, RN as Oncology Nurse Navigator Rockwell Germany, RN as Oncology Nurse Navigator Rolm Bookbinder, MD as Consulting Physician (General Surgery) Nicholas Lose, MD as Consulting Physician (Hematology [facility]) Kyung Rudd, MD as Consulting Physician (Radiation Oncology)  DIAGNOSIS:    ICD-10-CM   1. Malignant neoplasm of upper-outer quadrant of left breast in female, estrogen receptor negative ([place])  C50.412    Z17.1     SUMMARY OF ONCOLOGIC HISTORY: Oncology History  Malignant neoplasm of upper-outer quadrant of left breast in female, estrogen receptor negative ([place])  07/27/2019 Initial Diagnosis   Screening mammogram showed a left breast asymmetry. Diagnostic mammogram Sherri Sherri showed a 1.2cm left breast mass, 12:30 position, no left axillary adenopathy. 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   08/05/2019 Surgery   Left lumpectomy: Grade 3 IDC, 2 cm with high-grade DCIS, lymphovascular invasion was identified, 1 lymph node negative, margins negative, ER 0%, PR 0%, HER-2 positive, Ki-67 90%   08/05/2019 Cancer Staging   Staging form: Breast, AJCC 8th Edition - Pathologic stage from 08/05/2019: Stage IIA (pT2, pN0, cM0, G3, ER-, PR-, HER2+) - Signed by Gardenia Phlegm, NP on 08/18/2019   08/30/2019 -  Chemotherapy    Patient is on Treatment Plan: BREAST WEEKLY PACLITAXEL / TRASTUZUMAB / MAINTENANCE TRASTUZUMAB EVERY 21 DAYS        CHIEF COMPLIANT: Follow-up for Herceptin maintenance  INTERVAL HISTORY: Sherri Sherri is a 55 y.o. with above-mentioned history of left breast cancerwhounderwent a lumpectomy,adjuvant chemotherapy, Sherri is currently on adjuvant Herceptin maintenance.She is a participant in the neuropathy research study.Mammogram on 08/30/20 showed no evidence of malignancy bilaterally. She presents to the clinic  todayfortreatment.   The redness of the breast has improved somewhat.  Sherri Sherri:  is allergic to tape.  Sherri Sherri:  Current Outpatient Sherri Sherri  Medication Sig Dispense Refill  . ibuprofen (ADVIL) 200 MG tablet Take 400-600 mg by mouth every 8 (eight) hours as needed for moderate pain.  (Patient not taking: Reported on 12/30/2019)    . lidocaine-prilocaine (EMLA) cream Apply to affected area once (Patient not taking: Reported on 12/30/2019) 30 g 3  . Multiple Vitamin (MULTIVITAMIN WITH MINERALS) TABS tablet Take 1 tablet by mouth daily. One-A-Day for Women 50+ (Patient not taking: Reported on 12/30/2019)     No current facility-administered Sherri Sherri for this visit.    PHYSICAL EXAMINATION: ECOG PERFORMANCE STATUS: 1 - Symptomatic but completely ambulatory  Vitals:   09/05/20 1153  BP: 137/74  Pulse: 83  Resp: 16  Temp: (!) 97.5 F (36.4 C)  SpO2: 98%   Filed Weights   09/05/20 1153  Weight: 163 lb 8 oz (74.2 kg)     LABORATORY DATA:  I have reviewed the data as listed CMP Latest Ref Rng & Units 06/22/2020 05/04/2020 03/23/2020  Glucose 70 - 99 mg/dL 92 98 94  BUN 6 - 20 mg/dL 21(H) 19 23(H)  Creatinine 0.44 - 1.00 mg/dL 0.83 0.86 0.81  Sodium 135 - 145 mmol/L 138 141 140  Potassium 3.5 - 5.1 mmol/L 4.0 4.1 4.0  Chloride 98 - 111 mmol/L 106 108 107  CO2 22 - 32 mmol/L 24 23 23   Calcium 8.9 - 10.3 mg/dL 8.7(L) 9.2 8.9  Total Protein 6.5 - 8.1 g/dL 7.0 7.4 6.9  Total Bilirubin 0.3 - 1.2 mg/dL 0.4 0.4 <0.2(L)  Alkaline Phos 38 - 126 U/L  88 94 91  AST 15 - 41 U/L 18 20 22   ALT 0 - 44 U/L 20 22 19     Lab Results  Component Value Date   WBC 4.5 06/22/2020   HGB 14.5 06/22/2020   HCT 42.4 06/22/2020   MCV 94.0 06/22/2020   PLT 230 06/22/2020   NEUTROABS 2.4 06/22/2020    ASSESSMENT & PLAN:  Malignant neoplasm of upper-outer quadrant of left breast in female, estrogen receptor negative ([place]) 07/27/2019:Screening mammogram showed a left breast asymmetry.  Diagnostic mammogram Sherri Sherri showed a 1.2cm left breast mass, 12:30 position, no left axillary adenopathy. 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 T1c N0 stage Ia clinical stage  Recommendation: 1.08/05/2019:Left lumpectomy: Grade 3 IDC, 2 cm with high-grade DCIS, lymphovascular invasion was identified, 1 lymph node negative, margins negative, ER 0%, PR 0%, HER-2 positive, Ki-67 90% 2.Adjuvant chemotherapy with Taxol Herceptin followed by Herceptin maintenance for 1 yearstarted 5/10/2021completed 11/18/2019 3.Adjuvant radiation therapy9/21/2021-02/04/2020 Patient is participating in[place]OG S 1714[place]uropathy clinical trial ------------------------------------------------------------------------------------------------------------------------------------------------------ Current treatment: Herceptin maintenance every 3 weeks last Herceptin on 08/24/2020) Mild intermittent diarrhea Echocardiogram11/22/2021: EF55-60%   Redness of the left breast: Improvement.  Could be radiation recall   Surveillance:  08/30/2020: Mammogram: Benign breast density category B  Return to clinic every 3 weeks for Herceptin Sherri I will see her with her last Herceptin treatment which will be on 10/17/2020.     No orders of the defined types were placed in this encounter.  The patient has a good understanding of the overall plan. she agrees with it. she will call with any problems that may develop before the next visit here.  Total time spent: 30 mins including face to face time Sherri time spent for planning, charting Sherri coordination of care  Rulon Eisenmenger, MD, MPH 09/05/2020  I, Cloyde Reams Dorshimer, am acting as scribe for Dr. Nicholas Lose.  I have reviewed the above documentation for accuracy Sherri completeness, Sherri I agree with the above.

## 2020-09-05 ENCOUNTER — Inpatient Hospital Stay: Payer: BC Managed Care – PPO | Attending: Hematology and Oncology | Admitting: Hematology and Oncology

## 2020-09-05 ENCOUNTER — Other Ambulatory Visit: Payer: Self-pay

## 2020-09-05 ENCOUNTER — Encounter: Payer: Self-pay | Admitting: *Deleted

## 2020-09-05 ENCOUNTER — Inpatient Hospital Stay: Payer: BC Managed Care – PPO

## 2020-09-05 ENCOUNTER — Encounter: Payer: Self-pay | Admitting: Medical Oncology

## 2020-09-05 DIAGNOSIS — Z79899 Other long term (current) drug therapy: Secondary | ICD-10-CM | POA: Insufficient documentation

## 2020-09-05 DIAGNOSIS — Z5112 Encounter for antineoplastic immunotherapy: Secondary | ICD-10-CM | POA: Insufficient documentation

## 2020-09-05 DIAGNOSIS — C50412 Malignant neoplasm of upper-outer quadrant of left female breast: Secondary | ICD-10-CM | POA: Diagnosis not present

## 2020-09-05 DIAGNOSIS — Z171 Estrogen receptor negative status [ER-]: Secondary | ICD-10-CM

## 2020-09-05 MED ORDER — ACETAMINOPHEN 325 MG PO TABS
650.0000 mg | ORAL_TABLET | Freq: Once | ORAL | Status: AC
  Administered 2020-09-05: 650 mg via ORAL

## 2020-09-05 MED ORDER — HEPARIN SOD (PORK) LOCK FLUSH 100 UNIT/ML IV SOLN
500.0000 [IU] | Freq: Once | INTRAVENOUS | Status: AC | PRN
  Administered 2020-09-05: 500 [IU]
  Filled 2020-09-05: qty 5

## 2020-09-05 MED ORDER — TRASTUZUMAB-DKST CHEMO 420 MG IV SOLR
6.0000 mg/kg | Freq: Once | INTRAVENOUS | Status: AC
  Administered 2020-09-05: 420 mg via INTRAVENOUS
  Filled 2020-09-05: qty 20

## 2020-09-05 MED ORDER — SODIUM CHLORIDE 0.9 % IV SOLN
Freq: Once | INTRAVENOUS | Status: AC
  Filled 2020-09-05: qty 250

## 2020-09-05 MED ORDER — SODIUM CHLORIDE 0.9% FLUSH
10.0000 mL | INTRAVENOUS | Status: DC | PRN
  Administered 2020-09-05: 10 mL
  Filled 2020-09-05: qty 10

## 2020-09-05 MED ORDER — ACETAMINOPHEN 325 MG PO TABS
ORAL_TABLET | ORAL | Status: AC
  Filled 2020-09-05: qty 2

## 2020-09-05 NOTE — Progress Notes (Signed)
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 Week 52 visit Patient presented to the clinic, alone today, for assessments and treatment. I met with patient after her office visit with Dr. Lindi Adie.   Patient and I reviewed her medication list, and patient confirms to not be taking anything for neuropathy. Patient denies having any new or concerning issues. Patient confirms today that she is not experiencing any neuropathy.    PROs: Questionnaires were provided to patient to complete. Completed questionnaires were collected and checked for completeness and accuracy.  Labs: No labs for study to be collected. Patient did not consent to the optional labs.  Physician Assessments: CTCAE and Treatment Burden assessment forms completed and signed by Dr. Lindi Adie. History of Falls: Patient denies having any falls since her last visit. Assessment for Interventions for CIPN: Reviewed with patient and CRFs completed. Neuropen Assessment: Completed per protocol, by this certified research RN, with time and documentation completed by Carol Ada, Research Coordinator.  [place] Assessment: Completed per protocol, by this Film/video editor, with time and documentation completed by Carol Ada, Research officer, political party.  Timed Get Up and Go Test: completed and was 6.6 seconds.  Plan: Patient informed, next study assessments to be at 104 weeks and she will be called for appointment to be scheduled. Patient denied having any questions at this time. Patient was thanked for her time and continued contribution and support of study and she was encouraged to call me for any questions or concerns she may have prior to her next appointment.  Maxwell Marion, RN, BSN, [facility] 09/05/2020 2:28 PM

## 2020-09-05 NOTE — Assessment & Plan Note (Signed)
07/27/2019:Screening mammogram showed a left breast asymmetry. Diagnostic mammogram and US showed a 1.2cm left breast mass, 12:30 position, no left axillary adenopathy. 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 T1c N0 stage Ia clinical stage  Recommendation: 1.08/05/2019:Left lumpectomy: Grade 3 IDC, 2 cm with high-grade DCIS, lymphovascular invasion was identified, 1 lymph node negative, margins negative, ER 0%, PR 0%, HER-2 positive, Ki-67 90% 2.Adjuvant chemotherapy with Taxol Herceptin followed by Herceptin maintenance for 1 yearstarted 5/10/2021completed 11/18/2019 3.Adjuvant radiation therapy9/21/2021-02/04/2020 Patient is participating inSWOG S 1714neuropathy clinical trial ------------------------------------------------------------------------------------------------------------------------------------------------------ Current treatment: Herceptin maintenance every 3 weeks last Herceptin on 08/24/2020) Mild intermittent diarrhea Echocardiogram11/22/2021: EF55-60%   Redness of the left breast: I suspect this is related to radiation recall.  She was in the yard working in the sun when this happened.  It appears to be getting better.  She was given antibiotics but her symptoms did not improve until last few days.   Surveillance:  08/30/2020: Mammogram: Benign breast density category B  Return to clinic every 3 weeks for Herceptin and I will see her with her last Herceptin treatment.

## 2020-09-05 NOTE — Patient Instructions (Signed)
[facility] MEDICAL [facility]COLOG[facility]  Discharge Instructions: Thank you for choosing [facility] to provide your oncology and hematology care.   If you have a lab appointment with the Cancer Center, please go directly to the Cancer Center and check in at the registration area.   Wear comfortable clothing and clothing appropriate for easy access to any Portacath or PICC line.   We strive to give you quality time with your provider. You may need to reschedule your appointment if you arrive late (15 or more minutes).  Arriving late affects you and other patients whose appointments are after yours.  Also, if you miss three or more appointments without notifying the office, you may be dismissed from the clinic at the provider's discretion.      For prescription refill requests, have your pharmacy contact our office and allow 72 hours for refills to be completed.    Today you received the following chemotherapy and/or immunotherapy agents trastuzumab      To help prevent nausea and vomiting after your treatment, we encourage you to take your nausea medication as directed.  BELOW ARE SYMPTOMS THAT SHOULD BE REPORTED IMMEDIATELY: *FEVER GREATER THAN 100.4 F (38 C) OR HIGHER *CHILLS OR SWEATING *NAUSEA AND VOMITING THAT IS NOT CONTROLLED WITH YOUR NAUSEA MEDICATION *UNUSUAL SHORTNESS OF BREATH *UNUSUAL BRUISING OR BLEEDING *URINARY PROBLEMS (pain or burning when urinating, or frequent urination) *BOWEL PROBLEMS (unusual diarrhea, constipation, pain near the anus) TENDERNESS IN MOUTH AND THROAT WITH OR WITHOUT PRESENCE OF ULCERS (sore throat, sores in mouth, or a toothache) UNUSUAL RASH, SWELLING OR PAIN  UNUSUAL VAGINAL DISCHARGE OR ITCHING   Items with * indicate a potential emergency and should be followed up as soon as possible or go to the Emergency Department if any problems should occur.  Please show the CHEMOTHERAPY ALERT CARD or IMMUNOTHERAPY ALERT CARD at check-in to  the Emergency Department and triage nurse.  Should you have questions after your visit or need to cancel or reschedule your appointment, please contact [facility]  Dept: [phone]  and follow the prompts.  Office hours are 8:00 a.m. to 4:30 p.m. Monday - Friday. Please note that voicemails left after 4:00 p.m. may not be returned until the following business day.  We are closed weekends and major holidays. You have access to a nurse at all times for urgent questions. Please call the main number to the clinic Dept: [phone] and follow the prompts.   For any non-urgent questions, you may also contact your provider using MyChart. We now offer e-Visits for anyone 18 and older to request care online for non-urgent symptoms. For details visit [email or web address].   Also download the MyChart app! Go to the app store, search "MyChart", open the app, select [facility], and log in with your MyChart username and password.  Due to Covid, a mask is required upon entering the hospital/clinic. If you do not have a mask, one will be given to you upon arrival. For doctor visits, patients may have 1 support person aged 18 or older with them. For treatment visits, patients cannot have anyone with them due to current Covid guidelines and our immunocompromised population.   

## 2020-09-06 ENCOUNTER — Telehealth: Payer: Self-pay | Admitting: Hematology and Oncology

## 2020-09-06 NOTE — Telephone Encounter (Signed)
Scheduled appointment per 05/17 los. Left a detailed message.

## 2020-09-07 ENCOUNTER — Ambulatory Visit: Payer: BC Managed Care – PPO | Admitting: Adult Health

## 2020-09-12 ENCOUNTER — Encounter: Payer: Self-pay | Admitting: *Deleted

## 2020-09-27 ENCOUNTER — Other Ambulatory Visit: Payer: Self-pay

## 2020-09-27 ENCOUNTER — Inpatient Hospital Stay: Payer: BC Managed Care – PPO | Attending: Hematology and Oncology

## 2020-09-27 VITALS — BP 122/87 | HR 80 | Temp 98.4°F | Resp 18 | Wt 163.2 lb

## 2020-09-27 DIAGNOSIS — C50412 Malignant neoplasm of upper-outer quadrant of left female breast: Secondary | ICD-10-CM | POA: Insufficient documentation

## 2020-09-27 DIAGNOSIS — Z171 Estrogen receptor negative status [ER-]: Secondary | ICD-10-CM | POA: Insufficient documentation

## 2020-09-27 DIAGNOSIS — Z79899 Other long term (current) drug therapy: Secondary | ICD-10-CM | POA: Insufficient documentation

## 2020-09-27 DIAGNOSIS — Z5112 Encounter for antineoplastic immunotherapy: Secondary | ICD-10-CM | POA: Diagnosis present

## 2020-09-27 MED ORDER — SODIUM CHLORIDE 0.9 % IV SOLN
Freq: Once | INTRAVENOUS | Status: AC
  Filled 2020-09-27: qty 250

## 2020-09-27 MED ORDER — ACETAMINOPHEN 325 MG PO TABS
650.0000 mg | ORAL_TABLET | Freq: Once | ORAL | Status: AC
  Administered 2020-09-27: 650 mg via ORAL

## 2020-09-27 MED ORDER — SODIUM CHLORIDE 0.9 % IV SOLN
6.0000 mg/kg | Freq: Once | INTRAVENOUS | Status: AC
  Administered 2020-09-27: 420 mg via INTRAVENOUS
  Filled 2020-09-27: qty 20

## 2020-09-27 NOTE — Patient Instructions (Signed)
[facility]COLOG[facility]  Discharge Instructions: Thank you for choosing [facility] to provide your oncology and hematology care.   If you have a lab appointment with the Cancer Center, please go directly to the Cancer Center and check in at the registration area.   Wear comfortable clothing and clothing appropriate for easy access to any Portacath or PICC line.   We strive to give you quality time with your provider. You may need to reschedule your appointment if you arrive late (15 or more minutes).  Arriving late affects you and other patients whose appointments are after yours.  Also, if you miss three or more appointments without notifying the office, you may be dismissed from the clinic at the provider's discretion.      For prescription refill requests, have your pharmacy contact our office and allow 72 hours for refills to be completed.    Today you received the following chemotherapy and/or immunotherapy agents trastuzumab      To help prevent nausea and vomiting after your treatment, we encourage you to take your nausea medication as directed.  BELOW ARE SYMPTOMS THAT SHOULD BE REPORTED IMMEDIATELY: *FEVER GREATER THAN 100.4 F (38 C) OR HIGHER *CHILLS OR SWEATING *NAUSEA AND VOMITING THAT IS NOT CONTROLLED WITH YOUR NAUSEA MEDICATION *UNUSUAL SHORTNESS OF BREATH *UNUSUAL BRUISING OR BLEEDING *URINARY PROBLEMS (pain or burning when urinating, or frequent urination) *BOWEL PROBLEMS (unusual diarrhea, constipation, pain near the anus) TENDERNESS IN MOUTH AND THROAT WITH OR WITHOUT PRESENCE OF ULCERS (sore throat, sores in mouth, or a toothache) UNUSUAL RASH, SWELLING OR PAIN  UNUSUAL VAGINAL DISCHARGE OR ITCHING   Items with * indicate a potential emergency and should be followed up as soon as possible or go to the Emergency Department if any problems should occur.  Please show the CHEMOTHERAPY ALERT CARD or IMMUNOTHERAPY ALERT CARD at check-in to  the Emergency Department and triage nurse.  Should you have questions after your visit or need to cancel or reschedule your appointment, please contact [facility]  Dept: [phone]  and follow the prompts.  Office hours are 8:00 a.m. to 4:30 p.m. Monday - Friday. Please note that voicemails left after 4:00 p.m. may not be returned until the following business day.  We are closed weekends and major holidays. You have access to a nurse at all times for urgent questions. Please call the main number to the clinic Dept: [phone] and follow the prompts.   For any non-urgent questions, you may also contact your provider using MyChart. We now offer e-Visits for anyone 18 and older to request care online for non-urgent symptoms. For details visit [email or web address].   Also download the MyChart app! Go to the app store, search "MyChart", open the app, select [facility], and log in with your MyChart username and password.  Due to Covid, a mask is required upon entering the hospital/clinic. If you do not have a mask, one will be given to you upon arrival. For doctor visits, patients may have 1 support person aged 18 or older with them. For treatment visits, patients cannot have anyone with them due to current Covid guidelines and our immunocompromised population.   

## 2020-10-17 ENCOUNTER — Encounter (HOSPITAL_COMMUNITY): Payer: Self-pay

## 2020-10-17 ENCOUNTER — Other Ambulatory Visit: Payer: Self-pay | Admitting: *Deleted

## 2020-10-17 DIAGNOSIS — Z5181 Encounter for therapeutic drug level monitoring: Secondary | ICD-10-CM

## 2020-10-17 NOTE — Progress Notes (Signed)
Patient Care Team: Antony Blackbird, MD (Inactive) as PCP - General (Family Medicine) Mauro Kaufmann, RN as Oncology Nurse Navigator Rockwell Germany, RN as Oncology Nurse Navigator Rolm Bookbinder, MD as Consulting Physician (General Surgery) Sherri Lose, MD as Consulting Physician (Hematology and Oncology) Kyung Rudd, MD as Consulting Physician (Radiation Oncology)  DIAGNOSIS:    ICD-10-CM   1. Malignant neoplasm of upper-outer quadrant of left breast in female, estrogen receptor negative ([place])  C50.412    Z17.1       SUMMARY OF ONCOLOGIC HISTORY: Oncology History  Malignant neoplasm of upper-outer quadrant of left breast in female, estrogen receptor negative ([place])  07/27/2019 Initial Diagnosis   Screening mammogram showed a left breast asymmetry. Diagnostic mammogram and US showed a 1.2cm left breast mass, 12:30 position, no left axillary adenopathy. 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   08/05/2019 Surgery   Left lumpectomy: Grade 3 IDC, 2 cm with high-grade DCIS, lymphovascular invasion was identified, 1 lymph node negative, margins negative, ER 0%, PR 0%, HER-2 positive, Ki-67 90%   08/05/2019 Cancer Staging   Staging form: Breast, AJCC 8th Edition - Pathologic stage from 08/05/2019: Stage IIA (pT2, pN0, cM0, G3, ER-, PR-, HER2+) - Signed by Gardenia Phlegm, NP on 08/18/2019    08/30/2019 -  Chemotherapy    Patient is on Treatment Plan: BREAST WEEKLY PACLITAXEL / TRASTUZUMAB / MAINTENANCE TRASTUZUMAB EVERY 21 DAYS         CHIEF COMPLIANT: Follow-up for Herceptin maintenance  INTERVAL HISTORY: [name] is a 55 y.o. with above-mentioned history of left breast cancer who underwent a lumpectomy, adjuvant chemotherapy, and is currently on adjuvant Herceptin maintenance. She is a participant in the neuropathy research study. She presents to clinic today for treatment.  Today is her last treatment with Herceptin.  She has done  extremely well from that standpoint.  She has had intermittent loose stools.  Her only other complaint today is severe vaginal dryness.  She had pain with intercourse.  ALLERGIES:  is allergic to tape.  MEDICATIONS:  Current Outpatient Medications  Medication Sig Dispense Refill   ibuprofen (ADVIL) 200 MG tablet Take 400-600 mg by mouth every 8 (eight) hours as needed for moderate pain.  (Patient not taking: Reported on 12/30/2019)     lidocaine-prilocaine (EMLA) cream Apply to affected area once (Patient not taking: Reported on 12/30/2019) 30 g 3   Multiple Vitamin (MULTIVITAMIN WITH MINERALS) TABS tablet Take 1 tablet by mouth daily. One-A-Day for Women 50+ (Patient not taking: Reported on 12/30/2019)     No current facility-administered medications for this visit.    PHYSICAL EXAMINATION: ECOG PERFORMANCE STATUS: 1 - Symptomatic but completely ambulatory  Vitals:   10/18/20 1122  BP: 125/75  Pulse: (!) 105  Resp: 18  Temp: 97.9 F (36.6 C)  SpO2: 98%   Filed Weights   10/18/20 1122  Weight: 161 lb 9.6 oz (73.3 kg)    LABORATORY DATA:  I have reviewed the data as listed CMP Latest Ref Rng & Units 06/22/2020 05/04/2020 03/23/2020  Glucose 70 - 99 mg/dL 92 98 94  BUN 6 - 20 mg/dL 21(H) 19 23(H)  Creatinine 0.44 - 1.00 mg/dL 0.83 0.86 0.81  Sodium 135 - 145 mmol/L 138 141 140  Potassium 3.5 - 5.1 mmol/L 4.0 4.1 4.0  Chloride 98 - 111 mmol/L 106 108 107  CO2 22 - 32 mmol/L 24 23 23   Calcium 8.9 - 10.3 mg/dL  8.7(L) 9.2 8.9  Total Protein 6.5 - 8.1 g/dL 7.0 7.4 6.9  Total Bilirubin 0.3 - 1.2 mg/dL 0.4 0.4 <0.2(L)  Alkaline Phos 38 - 126 U/L 88 94 91  AST 15 - 41 U/L 18 20 22   ALT 0 - 44 U/L 20 22 19     Lab Results  Component Value Date   WBC 4.5 06/22/2020   HGB 14.5 06/22/2020   HCT 42.4 06/22/2020   MCV 94.0 06/22/2020   PLT 230 06/22/2020   NEUTROABS 2.4 06/22/2020    ASSESSMENT & PLAN:  Malignant neoplasm of upper-outer quadrant of left breast in female, estrogen  receptor negative ([place]) 07/27/2019:Screening mammogram showed a left breast asymmetry. Diagnostic mammogram and US showed a 1.2cm left breast mass, 12:30 position, no left axillary adenopathy. 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 T1c N0 stage Ia clinical stage   Recommendation: 1.  08/05/2019:Left lumpectomy: Grade 3 IDC, 2 cm with high-grade DCIS, lymphovascular invasion was identified, 1 lymph node negative, margins negative, ER 0%, PR 0%, HER-2 positive, Ki-67 90% 2.  Adjuvant chemotherapy with Taxol Herceptin followed by Herceptin maintenance for 1 year started 08/30/2019 completed 11/18/2019 3.  Adjuvant radiation therapy 01/11/2020-02/04/2020 Patient is participating in [place] neuropathy clinical trial  ------------------------------------------------------------------------------------------------------------------------------------------------------  Current treatment: Herceptin maintenance every 3 weeks last Herceptin on 08/24/2020) Mild intermittent diarrhea Echocardiogram 03/13/2020: EF 55-60%  Surveillance:  08/30/2020: Mammogram: Benign breast density category B   Vaginal dryness: I discussed with her about several lubricant products and I instructed her to get Replens over-the-counter.  I also instructed her to see her gynecologist so that they can examine her and prescribe estrogen creams if it was felt to be beneficial.  Given the fact that her cancer is estrogen receptor negative it would be okay to use it.  Today's last dose of Herceptin. Return to clinic in 1 year for follow-up   No orders of the defined types were placed in this encounter.  The patient has a good understanding of the overall plan. she agrees with it. she will call with any problems that may develop before the next visit here.  Total time spent: 30 mins including face to face time and time spent for planning, charting and coordination of care  Rulon Eisenmenger, MD,  MPH 10/18/2020  I, Reinaldo Raddle, am acting as scribe for Dr. Nicholas Lose, MD.  I have reviewed the above documentation for accuracy and completeness, and I agree with the above.

## 2020-10-18 ENCOUNTER — Inpatient Hospital Stay (HOSPITAL_BASED_OUTPATIENT_CLINIC_OR_DEPARTMENT_OTHER): Payer: BC Managed Care – PPO | Admitting: Hematology and Oncology

## 2020-10-18 ENCOUNTER — Other Ambulatory Visit: Payer: Self-pay

## 2020-10-18 ENCOUNTER — Inpatient Hospital Stay: Payer: BC Managed Care – PPO

## 2020-10-18 VITALS — HR 95

## 2020-10-18 DIAGNOSIS — C50412 Malignant neoplasm of upper-outer quadrant of left female breast: Secondary | ICD-10-CM

## 2020-10-18 DIAGNOSIS — Z171 Estrogen receptor negative status [ER-]: Secondary | ICD-10-CM

## 2020-10-18 MED ORDER — SODIUM CHLORIDE 0.9% FLUSH
10.0000 mL | INTRAVENOUS | Status: DC | PRN
  Administered 2020-10-18: 10 mL
  Filled 2020-10-18: qty 10

## 2020-10-18 MED ORDER — HEPARIN SOD (PORK) LOCK FLUSH 100 UNIT/ML IV SOLN
500.0000 [IU] | Freq: Once | INTRAVENOUS | Status: AC | PRN
  Administered 2020-10-18: 500 [IU]
  Filled 2020-10-18: qty 5

## 2020-10-18 MED ORDER — SODIUM CHLORIDE 0.9 % IV SOLN
Freq: Once | INTRAVENOUS | Status: AC
  Filled 2020-10-18: qty 250

## 2020-10-18 MED ORDER — TRASTUZUMAB-DKST CHEMO 150 MG IV SOLR
6.0000 mg/kg | Freq: Once | INTRAVENOUS | Status: AC
  Administered 2020-10-18: 420 mg via INTRAVENOUS
  Filled 2020-10-18: qty 20

## 2020-10-18 MED ORDER — ACETAMINOPHEN 325 MG PO TABS
650.0000 mg | ORAL_TABLET | Freq: Once | ORAL | Status: AC
  Administered 2020-10-18: 650 mg via ORAL

## 2020-10-18 NOTE — Assessment & Plan Note (Signed)
07/27/2019:Screening mammogram showed a left breast asymmetry. Diagnostic mammogram and US showed a 1.2cm left breast mass, 12:30 position, no left axillary adenopathy. 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 T1c N0 stage Ia clinical stage  Recommendation: 1.08/05/2019:Left lumpectomy: Grade 3 IDC, 2 cm with high-grade DCIS, lymphovascular invasion was identified, 1 lymph node negative, margins negative, ER 0%, PR 0%, HER-2 positive, Ki-67 90% 2.Adjuvant chemotherapy with Taxol Herceptin followed by Herceptin maintenance for 1 yearstarted 5/10/2021completed 11/18/2019 3.Adjuvant radiation therapy9/21/2021-02/04/2020 Patient is participating inSWOG S 1714neuropathy clinical trial ------------------------------------------------------------------------------------------------------------------------------------------------------ Current treatment: Herceptin maintenance every 3 weekslast Herceptin on 08/24/2020) Mild intermittent diarrhea Echocardiogram11/22/2021: EF55-60%  Redness of the left breast: Improvement.  Could be radiation recall  Surveillance:  08/30/2020: Mammogram: Benign breast density category B  Today's last dose of Herceptin.

## 2020-10-18 NOTE — Patient Instructions (Signed)
[facility]  Discharge Instructions: Thank you for choosing [facility] to provide your oncology and hematology care.   If you have a lab appointment with the Cancer Center, please go directly to the Cancer Center and check in at the registration area.   Wear comfortable clothing and clothing appropriate for easy access to any Portacath or PICC line.   We strive to give you quality time with your provider. You may need to reschedule your appointment if you arrive late (15 or more minutes).  Arriving late affects you and other patients whose appointments are after yours.  Also, if you miss three or more appointments without notifying the office, you may be dismissed from the clinic at the provider's discretion.      For prescription refill requests, have your pharmacy contact our office and allow 72 hours for refills to be completed.    Today you received the following chemotherapy and/or immunotherapy agents trastuzumab      To help prevent nausea and vomiting after your treatment, we encourage you to take your nausea medication as directed.  BELOW ARE SYMPTOMS THAT SHOULD BE REPORTED IMMEDIATELY: *FEVER GREATER THAN 100.4 F (38 C) OR HIGHER *CHILLS OR SWEATING *NAUSEA AND VOMITING THAT IS NOT CONTROLLED WITH YOUR NAUSEA MEDICATION *UNUSUAL SHORTNESS OF BREATH *UNUSUAL BRUISING OR BLEEDING *URINARY PROBLEMS (pain or burning when urinating, or frequent urination) *BOWEL PROBLEMS (unusual diarrhea, constipation, pain near the anus) TENDERNESS IN MOUTH AND THROAT WITH OR WITHOUT PRESENCE OF ULCERS (sore throat, sores in mouth, or a toothache) UNUSUAL RASH, SWELLING OR PAIN  UNUSUAL VAGINAL DISCHARGE OR ITCHING   Items with * indicate a potential emergency and should be followed up as soon as possible or go to the Emergency Department if any problems should occur.  Please show the CHEMOTHERAPY ALERT CARD or IMMUNOTHERAPY ALERT CARD at check-in to  the Emergency Department and triage nurse.  Should you have questions after your visit or need to cancel or reschedule your appointment, please contact [facility]  Dept: [phone]  and follow the prompts.  Office hours are 8:00 a.m. to 4:30 p.m. Monday - Friday. Please note that voicemails left after 4:00 p.m. may not be returned until the following business day.  We are closed weekends and major holidays. You have access to a nurse at all times for urgent questions. Please call the main number to the clinic Dept: [phone] and follow the prompts.   For any non-urgent questions, you may also contact your provider using MyChart. We now offer e-Visits for anyone 18 and older to request care online for non-urgent symptoms. For details visit [email or web address].   Also download the MyChart app! Go to the app store, search "MyChart", open the app, select [facility], and log in with your MyChart username and password.  Due to Covid, a mask is required upon entering the hospital/clinic. If you do not have a mask, one will be given to you upon arrival. For doctor visits, patients may have 1 support person aged 18 or older with them. For treatment visits, patients cannot have anyone with them due to current Covid guidelines and our immunocompromised population.   

## 2020-10-19 ENCOUNTER — Encounter: Payer: Self-pay | Admitting: *Deleted

## 2020-10-19 ENCOUNTER — Telehealth: Payer: Self-pay | Admitting: Hematology and Oncology

## 2020-10-19 NOTE — Telephone Encounter (Signed)
Scheduled per 6/29 los. Called and spoke with pt confirmed 6/29 appt

## 2020-11-03 ENCOUNTER — Ambulatory Visit (HOSPITAL_COMMUNITY)
Admission: RE | Admit: 2020-11-03 | Discharge: 2020-11-03 | Disposition: A | Discharge status: Home / Self Care | Payer: BC Managed Care – PPO | Source: Ambulatory Visit | Attending: Hematology and Oncology | Admitting: Hematology and Oncology

## 2020-11-03 ENCOUNTER — Other Ambulatory Visit: Payer: Self-pay

## 2020-11-03 DIAGNOSIS — Z5111 Encounter for antineoplastic chemotherapy: Secondary | ICD-10-CM | POA: Diagnosis present

## 2020-11-03 DIAGNOSIS — Z79899 Other long term (current) drug therapy: Secondary | ICD-10-CM | POA: Diagnosis not present

## 2020-11-03 DIAGNOSIS — C50919 Malignant neoplasm of unspecified site of unspecified female breast: Secondary | ICD-10-CM | POA: Insufficient documentation

## 2020-11-03 DIAGNOSIS — Z0189 Encounter for other specified special examinations: Secondary | ICD-10-CM | POA: Diagnosis not present

## 2020-11-03 DIAGNOSIS — I517 Cardiomegaly: Secondary | ICD-10-CM | POA: Insufficient documentation

## 2020-11-03 DIAGNOSIS — Z5181 Encounter for therapeutic drug level monitoring: Secondary | ICD-10-CM

## 2020-11-03 LAB — ECHOCARDIOGRAM COMPLETE
Area-P 1/2: 3.21 cm2
Calc EF: 59.3 %
S' Lateral: 2.8 cm
Single Plane A2C EF: 62.1 %
Single Plane A4C EF: 54.8 %

## 2020-11-03 NOTE — Progress Notes (Signed)
  Echocardiogram 2D Echocardiogram has been performed.  [name] 11/03/2020, 11:21 AM

## 2021-02-16 ENCOUNTER — Other Ambulatory Visit: Payer: Self-pay

## 2021-02-16 ENCOUNTER — Other Ambulatory Visit (HOSPITAL_COMMUNITY)
Admission: RE | Admit: 2021-02-16 | Discharge: 2021-02-16 | Disposition: A | Discharge status: Home / Self Care | Payer: BC Managed Care – PPO | Source: Ambulatory Visit | Attending: Physician Assistant | Admitting: Physician Assistant

## 2021-02-16 ENCOUNTER — Encounter: Payer: Self-pay | Admitting: Physician Assistant

## 2021-02-16 ENCOUNTER — Ambulatory Visit: Payer: BC Managed Care – PPO | Admitting: Physician Assistant

## 2021-02-16 VITALS — BP 120/68 | HR 95 | Temp 98.2°F | Ht 60.0 in | Wt 162.0 lb

## 2021-02-16 DIAGNOSIS — Z124 Encounter for screening for malignant neoplasm of cervix: Secondary | ICD-10-CM | POA: Insufficient documentation

## 2021-02-16 DIAGNOSIS — Z136 Encounter for screening for cardiovascular disorders: Secondary | ICD-10-CM

## 2021-02-16 DIAGNOSIS — M25551 Pain in right hip: Secondary | ICD-10-CM | POA: Diagnosis not present

## 2021-02-16 DIAGNOSIS — Z23 Encounter for immunization: Secondary | ICD-10-CM

## 2021-02-16 DIAGNOSIS — Z0001 Encounter for general adult medical examination with abnormal findings: Secondary | ICD-10-CM | POA: Diagnosis not present

## 2021-02-16 DIAGNOSIS — Z13220 Encounter for screening for lipoid disorders: Secondary | ICD-10-CM | POA: Diagnosis not present

## 2021-02-16 DIAGNOSIS — E669 Obesity, unspecified: Secondary | ICD-10-CM | POA: Diagnosis not present

## 2021-02-16 DIAGNOSIS — Z1211 Encounter for screening for malignant neoplasm of colon: Secondary | ICD-10-CM

## 2021-02-16 NOTE — Progress Notes (Signed)
[name] is a 55 y.o. female here to establish care.  History of Present Illness:   Chief Complaint  Patient presents with   Establish Care   Hip Pain    Pt c/o right hip pain x 10 years, worse past 2 years and is becoming more bothersome.    Acute Concerns: Right Hip Pain Pt presents with c/o right hip pain that has been ongoing for the past ten years. [name] states this hip pain has recently become more bothersome in the last two years. She does have a hx of arthritis but states it seemingly got worse as she was going through chemotherapy. Reported experiencing stiffness upon waking up in the morning or standing up after sitting a while.  Only taking ibuprofen which has provided minor relief. Denies numbness, tingling, past or recent injury.   Chronic Issues: Hx of Breast Cancer [name] was dx with breast cancer upon finding a 1.2 cm mass in her left breast following a mammogram in March of 2021. Following this finding she underwent a left lumpectomy in April and started adjuvant chemotherapy with Taxol Herceptin.   [name] has since concluded treatment with Herceptin as of 10/18/20 and according to Dr. Lindi Adie, Oncology, she has done extremely well and is only experiencing vaginal dryness and intermittent loose stools.   Health Maintenance: Immunizations -- COVID- 07/19/19 ([place]- 2 doses) Tdap- Updated today Influenza Vaccine- Last completed 02/10/20 Colonoscopy -- Not completed Mammogram -- Last completed 08/30/20 PAP -- Updated today Bone Density -- N/A Diet -- Normal Ophthalmology- UTD Dentistry- UTD Sleep habits -- Normal Exercise -- Goes walking twice a week Weight -- Stable Wt Readings from Last 3 Encounters:  02/16/21 162 lb (73.5 kg)  10/18/20 161 lb 9.6 oz (73.3 kg)  09/27/20 163 lb 4 oz (74 kg)    Mood -- Stable; situational anxiety  Alcohol -- Average 3 glasses of wine x week Tobacco -- Never  Depression screen [facility] 2/9 12/30/2018  Decreased Interest  0  Down, Depressed, Hopeless 0  PHQ - 2 Score 0  Altered sleeping 0  Tired, decreased energy 0  Change in appetite 0  Feeling bad or failure about yourself  0  Trouble concentrating 0  Moving slowly or fidgety/restless 0  Suicidal thoughts 0  PHQ-9 Score 0    No flowsheet data found.   Other providers/specialists: Patient Care Team: Inda Coke, [place] as PCP - General (Physician Assistant) Mauro Kaufmann, RN as Oncology Nurse Navigator Rockwell Germany, RN as Oncology Nurse Navigator Rolm Bookbinder, MD as Consulting Physician (General Surgery) Nicholas Lose, MD as Consulting Physician (Hematology and Oncology) Kyung Rudd, MD as Consulting Physician (Radiation Oncology)   Past Medical History:  Diagnosis Date   Arthritis    Cancer [facility])    Left breast   Headache    migraine      Social History   Tobacco Use   Smoking status: Never   Smokeless tobacco: Never  Vaping Use   Vaping Use: Never used  Substance Use Topics   Alcohol use: Yes    Alcohol/week: 5.0 standard drinks    Types: 5 Glasses of wine per week   Drug use: No    Past Surgical History:  Procedure Laterality Date   ARTERY BIOPSY Right 11/11/2018   Procedure: BIOPSY TEMPORAL ARTERY;  Surgeon: Rosetta Posner, MD;  Location: [facility] OR;  Service: Vascular;  Laterality: Right;   BREAST LUMPECTOMY WITH RADIOACTIVE SEED AND SENTINEL LYMPH NODE BIOPSY Left 08/05/2019  Procedure: LEFT BREAST LUMPECTOMY WITH RADIOACTIVE SEED AND LEFT AXILLARY SENTINEL LYMPH NODE BIOPSY;  Surgeon: Rolm Bookbinder, MD;  Location: [facility];  Service: General;  Laterality: Left;  PEC BLOCK   CESAREAN SECTION     x2   PORTACATH PLACEMENT Right 08/05/2019   Procedure: INSERTION PORT-A-CATH WITH ULTRASOUND GUIDANCE;  Surgeon: Rolm Bookbinder, MD;  Location: [facility];  Service: General;  Laterality: Right;    Family History  Problem Relation Age of Onset   COPD Mother    Emphysema Mother    Cancer Father    Heart disease  Maternal Grandfather     Allergies  Allergen Reactions   Tape     Itching, redness, rash     Current Medications:   Current Outpatient Medications:    ibuprofen (ADVIL) 200 MG tablet, Take 400-600 mg by mouth every 8 (eight) hours as needed for moderate pain., Disp: , Rfl:    Multiple Vitamin (MULTIVITAMIN WITH MINERALS) TABS tablet, Take 1 tablet by mouth daily. One-A-Day for Women 50+, Disp: , Rfl:    Review of Systems:   Review of Systems  Constitutional:  Negative for chills, fever, malaise/fatigue and weight loss.  HENT:  Negative for hearing loss, sinus pain and sore throat.   Respiratory:  Negative for cough and hemoptysis.   Cardiovascular:  Negative for chest pain, palpitations, leg swelling and PND.  Gastrointestinal:  Negative for abdominal pain, constipation, diarrhea, heartburn, nausea and vomiting.  Genitourinary:  Negative for dysuria, frequency and urgency.  Musculoskeletal:  Positive for joint pain. Negative for back pain, myalgias and neck pain.  Skin:  Negative for itching and rash.  Neurological:  Negative for dizziness, tingling, seizures and headaches.  Endo/Heme/Allergies:  Negative for polydipsia.  Psychiatric/Behavioral:  Negative for depression. The patient is not nervous/anxious.    Vitals:   Vitals:   02/16/21 1534  BP: 120/68  Pulse: 95  Temp: 98.2 F (36.8 C)  TempSrc: Temporal  SpO2: 98%  Weight: 162 lb (73.5 kg)  Height: 5' (1.524 m)      Body mass index is 31.64 kg/m.  Physical Exam:   Physical Exam Vitals and nursing note reviewed.  Constitutional:      General: She is not in acute distress.    Appearance: Normal appearance. She is well-developed. She is not ill-appearing or toxic-appearing.  HENT:     Head: Normocephalic and atraumatic.     Right Ear: Tympanic membrane, ear canal and external ear normal. Tympanic membrane is not erythematous, retracted or bulging.     Left Ear: Tympanic membrane, ear canal and external ear  normal. Tympanic membrane is not erythematous, retracted or bulging.  Eyes:     General: Lids are normal.     Conjunctiva/sclera: Conjunctivae normal.     Pupils: Pupils are equal, round, and reactive to light.  Neck:     Trachea: Trachea normal.  Cardiovascular:     Rate and Rhythm: Normal rate and regular rhythm.     Pulses: Normal pulses.     Heart sounds: Normal heart sounds, S1 normal and S2 normal.     Comments: No LE swelling Pulmonary:     Effort: Pulmonary effort is normal. No tachypnea or respiratory distress.     Breath sounds: Normal breath sounds. No decreased breath sounds, wheezing, rhonchi or rales.  Abdominal:     General: Bowel sounds are normal.     Palpations: Abdomen is soft.     Tenderness: There is no abdominal tenderness.  Genitourinary:  Labia:        Right: No rash, tenderness or lesion.        Left: No rash, tenderness or lesion.      Vagina: Normal. No foreign body. No vaginal discharge, erythema, tenderness or bleeding.     Cervix: No cervical motion tenderness, discharge or friability.     Adnexa:        Right: No mass or tenderness.         Left: No mass or tenderness.    Musculoskeletal:        General: Normal range of motion.     Cervical back: Full passive range of motion without pain.     Right hip: No tenderness or bony tenderness. Normal range of motion. Normal strength.     Left hip: Normal.     Comments: No pain with resisted R hip flexion/extension No TTP of R hip  Lymphadenopathy:     Cervical: No cervical adenopathy.  Skin:    General: Skin is warm and dry.  Neurological:     Mental Status: She is alert.     GCS: GCS eye subscore is 4. GCS verbal subscore is 5. GCS motor subscore is 6.     Cranial Nerves: No cranial nerve deficit.     Sensory: No sensory deficit.     Deep Tendon Reflexes: Reflexes are normal and symmetric.  Psychiatric:        Speech: Speech normal.        Behavior: Behavior normal. Behavior is cooperative.     Assessment and Plan:   Encounter for general adult medical examination with abnormal findings Today patient counseled on age appropriate routine health concerns for screening and prevention, each reviewed and up to date or declined. Immunizations reviewed and up to date or declined. Labs ordered and reviewed. Risk factors for depression reviewed and negative. Hearing function and visual acuity are intact. ADLs screened and addressed as needed. Functional ability and level of safety reviewed and appropriate. Education, counseling and referrals performed based on assessed risks today. Patient provided with a copy of personalized plan for preventive services.  Right hip pain Chronic Due to chronicity, will order xray Likely refer to PT vs sports medicine based on results Declines medication today  Obesity, unspecified classification, unspecified obesity type, unspecified whether serious comorbidity present Recommend ongoing efforts and healthy lifestyle  Encounter for lipid screening for cardiovascular disease Update lipid panel and provide recommendations accordingly  Pap smear for cervical cancer screening Updated today  Special screening for malignant neoplasms, colon Cologuard ordered  Need for prophylactic vaccination with combined diphtheria-tetanus-pertussis (DTP) vaccine Updated today  Need for immunization against influenza Updated today    I,Havlyn C Ratchford,acting as a scribe for [employer], PA.,have documented all relevant documentation on the behalf of Inda Coke, PA,as directed by  Inda Coke, PA while in the presence of Inda Coke, [place].   I, Inda Coke, [place], have reviewed all documentation for this visit. The documentation on 02/16/21 for the exam, diagnosis, procedures, and orders are all accurate and complete.   Inda Coke, PA-C

## 2021-02-16 NOTE — Patient Instructions (Signed)
It was great to see you!  An order for an xray has been put in for you. To get your xray, you can walk in at the [facility] location without a scheduled appointment.  The address is [address]. It is across the street from [facility] is located in the basement.  Hours of operation are M-F 8:30am to 5:00pm. Please note that they are closed for lunch between 12:30 and 1:00pm.  Please go to the lab for blood work.   Our office will call you with your results unless you have chosen to receive results via MyChart.  If your blood work is normal we will follow-up each year for physicals and as scheduled for chronic medical problems.  If anything is abnormal we will treat accordingly and get you in for a follow-up.  Take care,  Aldona Bar

## 2021-02-17 LAB — LIPID PANEL
Cholesterol: 315 mg/dL — ABNORMAL HIGH (ref <200)
HDL: 77 mg/dL (ref >=50)
LDL Cholesterol (Calc): 191 mg/dL (calc) — ABNORMAL HIGH
Non-HDL Cholesterol (Calc): 238 mg/dL (calc) — ABNORMAL HIGH (ref <130)
Total CHOL/HDL Ratio: 4.1 (calc) (ref <5.0)
Triglycerides: 265 mg/dL — ABNORMAL HIGH (ref <150)

## 2021-02-20 ENCOUNTER — Encounter: Payer: Self-pay | Admitting: Physician Assistant

## 2021-02-21 ENCOUNTER — Ambulatory Visit (INDEPENDENT_AMBULATORY_CARE_PROVIDER_SITE_OTHER)
Admission: RE | Admit: 2021-02-21 | Discharge: 2021-02-21 | Disposition: A | Discharge status: Home / Self Care | Payer: BC Managed Care – PPO | Source: Ambulatory Visit | Attending: Physician Assistant | Admitting: Physician Assistant

## 2021-02-21 ENCOUNTER — Other Ambulatory Visit: Payer: Self-pay

## 2021-02-21 DIAGNOSIS — M25551 Pain in right hip: Secondary | ICD-10-CM | POA: Diagnosis not present

## 2021-02-21 LAB — CYTOLOGY - PAP
Comment: NEGATIVE
Diagnosis: NEGATIVE
High risk HPV: NEGATIVE

## 2021-02-21 MED ORDER — ATORVASTATIN CALCIUM 20 MG PO TABS: 20.0000 mg | ORAL_TABLET | Freq: Every day | ORAL | 1 refills | Status: DC

## 2021-02-22 ENCOUNTER — Encounter: Payer: Self-pay | Admitting: Physician Assistant

## 2021-02-22 ENCOUNTER — Other Ambulatory Visit: Payer: Self-pay

## 2021-02-22 DIAGNOSIS — M25551 Pain in right hip: Secondary | ICD-10-CM

## 2021-02-22 DIAGNOSIS — M549 Dorsalgia, unspecified: Secondary | ICD-10-CM

## 2021-02-28 ENCOUNTER — Other Ambulatory Visit: Payer: Self-pay

## 2021-02-28 ENCOUNTER — Ambulatory Visit (INDEPENDENT_AMBULATORY_CARE_PROVIDER_SITE_OTHER): Payer: BC Managed Care – PPO | Admitting: Physical Therapy

## 2021-02-28 DIAGNOSIS — M25551 Pain in right hip: Secondary | ICD-10-CM

## 2021-02-28 NOTE — Patient Instructions (Signed)
Access Code: [email or web address] URL: [email or web address] Date: 02/28/2021 Prepared by: [name]  Exercises Single Knee to Chest Stretch - 2 x daily - 3 reps - 30 hold Seated Piriformis Stretch with Trunk Bend - 2 x daily - 3 reps - 30 hold Supine Piriformis Stretch Pulling Heel to Hip - 2 x daily - 3 reps - 30 hold Supine Piriformis Stretch with Leg Straight - 2 x daily - 3 reps - 30 hold Sidelying Hip Abduction - 1 x daily - 1-2 sets - 10 reps Clamshell - 1 x daily - 1-2 sets - 10 reps

## 2021-03-02 ENCOUNTER — Telehealth: Payer: Self-pay

## 2021-03-02 NOTE — Telephone Encounter (Signed)
Pt called stating that she has a missed call from our office. Please Advise.

## 2021-03-02 NOTE — Telephone Encounter (Signed)
Spoke to pt told her I did not call her I think someone called her about PT referral and looks like you are already scheduled. Pt said yes they called her about an earlier appt so I am scheduled for Monday. Told her okay.

## 2021-03-05 ENCOUNTER — Encounter: Payer: BC Managed Care – PPO | Admitting: Physical Therapy

## 2021-03-06 LAB — COLOGUARD: COLOGUARD: NEGATIVE

## 2021-03-18 ENCOUNTER — Encounter: Payer: Self-pay | Admitting: Physical Therapy

## 2021-03-18 NOTE — Therapy (Signed)
[facility] [address] [place], [place], [address] Phone: [phone]   Fax:  [phone]  Physical Therapy Evaluation  Patient Details  Name: [name] MRN: [id number] Date of Birth: 1966-01-12 Referring Provider (PT): Inda Coke   Encounter Date: 02/28/2021   PT End of Session - 03/18/21 1649     Visit Number 1    Number of Visits 12    Date for PT Re-Evaluation 04/25/21    Authorization Type BCBS    PT Start Time 1600    PT Stop Time 1194    PT Time Calculation (min) 45 min    Activity Tolerance Patient tolerated treatment well    Behavior During Therapy [facility] for tasks assessed/performed             Past Medical History:  Diagnosis Date   Arthritis    Cancer ([place])    Left breast   Headache    migraine     Past Surgical History:  Procedure Laterality Date   ARTERY BIOPSY Right 11/11/2018   Procedure: BIOPSY TEMPORAL ARTERY;  Surgeon: Rosetta Posner, MD;  Location: [place];  Service: Vascular;  Laterality: Right;   BREAST LUMPECTOMY WITH RADIOACTIVE SEED AND SENTINEL LYMPH NODE BIOPSY Left 08/05/2019   Procedure: LEFT BREAST LUMPECTOMY WITH RADIOACTIVE SEED AND LEFT AXILLARY SENTINEL LYMPH NODE BIOPSY;  Surgeon: Rolm Bookbinder, MD;  Location: [facility];  Service: General;  Laterality: Left;  [place]     x2   PORTACATH PLACEMENT Right 08/05/2019   Procedure: INSERTION PORT-A-CATH WITH ULTRASOUND GUIDANCE;  Surgeon: Rolm Bookbinder, MD;  Location: [facility];  Service: General;  Laterality: Right;    There were no vitals filed for this visit.    Subjective Assessment - 03/18/21 1643     Subjective Pt states pain in R hip for about  2 years, around the same time she was getting breat cancer treatments. Also states longstanding pain in R SI. She walks up to 3 mi, 2x/wk.    Pertinent History breast CA (L)    Patient Stated Goals decreased pain in R hip.    Currently in Pain? Yes    Pain Score  9     Pain Location Hip    Pain Orientation Right    Pain Descriptors / Indicators Aching    Pain Type Acute pain    Pain Onset More than a month ago    Pain Frequency Intermittent    Aggravating Factors  increased standing, walking    Pain Score 8    Pain Location Back    Pain Orientation Right    Pain Descriptors / Indicators Aching    Pain Type Chronic pain    Pain Onset More than a month ago    Pain Frequency Intermittent    Aggravating Factors  transtions                [facility] PT Assessment - 03/18/21 0001       Assessment   Medical Diagnosis R hip pain    Referring Provider (PT) Inda Coke    Prior Therapy no      Precautions   Precautions None      Balance Screen   Has the patient fallen in the past 6 months No      Prior Function   Level of Independence Independent      Cognition   Overall Cognitive Status Within Functional Limits for tasks assessed  AROM   Overall AROM Comments Hips: mild limitation for flex, mod liimtation for ER R>L.      Strength   Overall Strength Comments Hips: 4-/5, Knee: 4+/5      Palpation   Palpation comment Pain in R gr troch, into R glute, and R SI.                        Objective measurements completed on examination: See above findings.       [facility] Adult PT Treatment/Exercise - 03/18/21 0001       Exercises   Exercises Knee/Hip      Knee/Hip Exercises: Stretches   Other Knee/Hip Stretches SKTC 30 sec x 3; Piriformis supine mod fig 4, 30 sec s 3 ; seated piriformis x 2 min;      Knee/Hip Exercises: Sidelying   Hip ABduction 10 reps;Right    Clams x10  R;                     PT Education - 03/18/21 1648     Education Details PT POC, Exam findings, HEP    Person(s) Educated Patient    Methods Explanation;Demonstration;Tactile cues;Verbal cues;Handout    Comprehension Verbalized understanding;Returned demonstration;Verbal cues required;Tactile cues required;Need further  instruction              PT Short Term Goals - 03/18/21 1820       PT SHORT TERM GOAL #1   Title Pt to be independent with initial HEP    Time 2    Period Weeks    Status New    Target Date 03/14/21               PT Long Term Goals - 03/18/21 1821       PT LONG TERM GOAL #1   Title Pt to be independent wtih final HEP    Time 8    Period Weeks    Status New    Target Date 04/25/21      PT LONG TERM GOAL #2   Title Pt to report decreased pain in R hip, to 0-2/10 with activity    Time 8    Period Weeks    Status New    Target Date 04/25/21      PT LONG TERM GOAL #3   Title Pt to demo improved strength of bil hips to at least 4+/5 to improve stability, and ability for stairs, walking.    Time 8    Period Weeks    Status New    Target Date 04/25/21                    Plan - 03/18/21 1654     Clinical Impression Statement Pt presents with primary complaint of increased pain in R hip. She has mild limitation for ROM and decreased strength and stability in bi hips. Pt with decreased ability for full functional activity due to pain and dysfunction. She has limited ability for standing, walking activity, transitions and IADLS due to pain. Pt to benefit from skilled PT to improve deficits and pain.    Personal Factors and Comorbidities Time since onset of injury/illness/exacerbation    Examination-Activity Limitations Bed Mobility;Bend;Squat;Stairs;Locomotion Level;Transfers;Stand    Examination-Participation Restrictions Cleaning;Community Activity;Shop    Stability/Clinical Decision Making Stable/Uncomplicated    Clinical Decision Making Low    Rehab Potential Good    PT Frequency 2x / week  PT Duration 8 weeks    PT Treatment/Interventions ADLs/Self Care Home Management;Cryotherapy;Electrical Stimulation;Iontophoresis 4mg /ml Dexamethasone;Moist Heat;Traction;Ultrasound;DME Instruction;Gait training;Neuromuscular re-education;Balance  training;Therapeutic exercise;Therapeutic activities;Functional mobility training;Stair training;Patient/family education;Orthotic Fit/Training;Manual techniques;Passive range of motion;Dry needling;Energy conservation;Spinal Manipulations;Joint Manipulations;Taping;Vasopneumatic Device    PT Home Exercise Plan [email or web address]    Consulted and Agree with Plan of Care Patient             Patient will benefit from skilled therapeutic intervention in order to improve the following deficits and impairments:  Decreased range of motion, Increased muscle spasms, Decreased activity tolerance, Pain, Impaired flexibility, Decreased mobility, Decreased strength  Visit Diagnosis: Pain in right hip     Problem List Patient Active Problem List   Diagnosis Date Noted   Port-A-Cath in place 10/07/2019   Malignant neoplasm of upper-outer quadrant of left breast in female, estrogen receptor negative ([place]) 07/27/2019   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

## 2021-03-19 ENCOUNTER — Encounter: Payer: Self-pay | Admitting: Physical Therapy

## 2021-03-19 ENCOUNTER — Other Ambulatory Visit: Payer: Self-pay

## 2021-03-19 ENCOUNTER — Encounter: Payer: BC Managed Care – PPO | Admitting: Physical Therapy

## 2021-03-19 ENCOUNTER — Ambulatory Visit (INDEPENDENT_AMBULATORY_CARE_PROVIDER_SITE_OTHER): Payer: BC Managed Care – PPO | Admitting: Physical Therapy

## 2021-03-19 DIAGNOSIS — M25551 Pain in right hip: Secondary | ICD-10-CM | POA: Diagnosis not present

## 2021-03-19 NOTE — Patient Instructions (Signed)
Access Code: [email or web address] URL: [email or web address] Date: 03/19/2021 Prepared by: [name]  Exercises Single Knee to Chest Stretch - 2 x daily - 3 reps - 30 hold Seated Piriformis Stretch with Trunk Bend - 2 x daily - 3 reps - 30 hold Supine Piriformis Stretch Pulling Heel to Hip - 2 x daily - 3 reps - 30 hold Supine Piriformis Stretch with Leg Straight - 2 x daily - 3 reps - 30 hold Supine ITB Stretch with Strap - 2 x daily - 3 reps - 30 hold Sidelying Hip Abduction - 1 x daily - 1-2 sets - 10 reps Clamshell - 1 x daily - 1-2 sets - 10 reps Hooklying Isometric Clamshell - 1 x daily - 2 sets - 10 reps Supine Bridge - 1 x daily - 2 sets - 10 reps

## 2021-03-19 NOTE — Therapy (Signed)
[facility] [address] [place], [place], [address] Phone: [phone]   Fax:  [phone]  Physical Therapy Treatment  Patient Details  Name: [name] MRN: [id number] Date of Birth: [birth year] Referring Provider (PT): Inda Coke   Encounter Date: 03/19/2021   PT End of Session - 03/19/21 1647     Visit Number 2    Number of Visits 12    Date for PT Re-Evaluation 04/25/21    Authorization Type BCBS    PT Start Time 1600    PT Stop Time 1655    PT Time Calculation (min) 55 min    Activity Tolerance Patient tolerated treatment well    Behavior During Therapy [facility] for tasks assessed/performed             Past Medical History:  Diagnosis Date   Arthritis    Cancer ([place])    Left breast   Headache    migraine     Past Surgical History:  Procedure Laterality Date   ARTERY BIOPSY Right 11/11/2018   Procedure: BIOPSY TEMPORAL ARTERY;  Surgeon: Rosetta Posner, MD;  Location: [place];  Service: Vascular;  Laterality: Right;   BREAST LUMPECTOMY WITH RADIOACTIVE SEED AND SENTINEL LYMPH NODE BIOPSY Left 08/05/2019   Procedure: LEFT BREAST LUMPECTOMY WITH RADIOACTIVE SEED AND LEFT AXILLARY SENTINEL LYMPH NODE BIOPSY;  Surgeon: Rolm Bookbinder, MD;  Location: [place];  Service: General;  Laterality: Left;  [place]     x2   PORTACATH PLACEMENT Right 08/05/2019   Procedure: INSERTION PORT-A-CATH WITH ULTRASOUND GUIDANCE;  Surgeon: Rolm Bookbinder, MD;  Location: [place];  Service: General;  Laterality: Right;    There were no vitals filed for this visit.   Subjective Assessment - 03/19/21 1646     Subjective Pt states soreness in R hip. Has been doing HEP    Currently in Pain? Yes    Pain Score 8     Pain Location Hip    Pain Orientation Right    Pain Descriptors / Indicators Aching    Pain Type Acute pain    Pain Onset More than a month ago    Pain Frequency Intermittent    Pain Relieving Factors  sleeping, at night,                               [facility] Adult PT Treatment/Exercise - 03/19/21 0001       Exercises   Exercises Knee/Hip      Knee/Hip Exercises: Stretches   Other Knee/Hip Stretches ; Piriformis supine mod fig 4, 30 sec s 3 ; seated piriformis x 2 min; ITB with strap 30 sec x 3 on R;      Knee/Hip Exercises: Aerobic   Recumbent Bike L1 x 6 min;      Knee/Hip Exercises: Supine   Bridges 15 reps    Other Supine Knee/Hip Exercises Clam Blue TB x 20 alternating with TA;      Knee/Hip Exercises: Sidelying   Hip ABduction Right;10 reps;2 sets    Clams x15  R;      Modalities   Modalities Moist Heat      Moist Heat Therapy   Number Minutes Moist Heat 10 Minutes    Moist Heat Location Hip      Manual Therapy   Manual Therapy Soft tissue mobilization    Manual therapy comments skilled palpation and monitoring  of soft tissue wtih dry needling.    Soft tissue mobilization DTM/IASTM to R gr troch and glute med, min, piriformis.              Trigger Point Dry Needling - 03/19/21 0001     Consent Given? Yes    Education Handout Provided Yes    Muscles Treated Back/Hip Gluteus minimus;Gluteus medius;Piriformis    Gluteus Minimus Response Palpable increased muscle length   R   Gluteus Medius Response Palpable increased muscle length   R   Piriformis Response Palpable increased muscle length   R                  PT Education - 03/19/21 1647     Education Details reviewed and updated HEP    Person(s) Educated Patient    Methods Explanation;Demonstration;Tactile cues;Verbal cues;Handout    Comprehension Verbalized understanding;Returned demonstration;Verbal cues required;Tactile cues required;Need further instruction              PT Short Term Goals - 03/18/21 1820       PT SHORT TERM GOAL #1   Title Pt to be independent with initial HEP    Time 2    Period Weeks    Status New    Target Date 03/14/21                PT Long Term Goals - 03/18/21 1821       PT LONG TERM GOAL #1   Title Pt to be independent wtih final HEP    Time 8    Period Weeks    Status New    Target Date 04/25/21      PT LONG TERM GOAL #2   Title Pt to report decreased pain in R hip, to 0-2/10 with activity    Time 8    Period Weeks    Status New    Target Date 04/25/21      PT LONG TERM GOAL #3   Title Pt to demo improved strength of bil hips to at least 4+/5 to improve stability, and ability for stairs, walking.    Time 8    Period Weeks    Status New    Target Date 04/25/21                   Plan - 03/19/21 1648     Clinical Impression Statement Pt educated on ther ex for hip mobility and strength, updated HEP. She has good ability for performing ther ex today. She has much tenderness in gr troch and tightness in glute, addressed with manual and Dry needling today, will assess effects next visit.    Personal Factors and Comorbidities Time since onset of injury/illness/exacerbation    Examination-Activity Limitations Bed Mobility;Bend;Squat;Stairs;Locomotion Level;Transfers;Stand    Examination-Participation Restrictions Cleaning;Community Activity;Shop    Stability/Clinical Decision Making Stable/Uncomplicated    Rehab Potential Good    PT Frequency 2x / week    PT Duration 8 weeks    PT Treatment/Interventions ADLs/Self Care Home Management;Cryotherapy;Electrical Stimulation;Iontophoresis 4mg /ml Dexamethasone;Moist Heat;Traction;Ultrasound;DME Instruction;Gait training;Neuromuscular re-education;Balance training;Therapeutic exercise;Therapeutic activities;Functional mobility training;Stair training;Patient/family education;Orthotic Fit/Training;Manual techniques;Passive range of motion;Dry needling;Energy conservation;Spinal Manipulations;Joint Manipulations;Taping;Vasopneumatic Device    PT Home Exercise Plan [id number]    Consulted and Agree with Plan of Care Patient             Patient will  benefit from skilled therapeutic intervention in order to improve the following deficits and impairments:  Decreased range of motion, Increased  muscle spasms, Decreased activity tolerance, Pain, Impaired flexibility, Decreased mobility, Decreased strength  Visit Diagnosis: Pain in right hip     Problem List Patient Active Problem List   Diagnosis Date Noted   Port-A-Cath in place 10/07/2019   Malignant neoplasm of upper-outer quadrant of left breast in female, estrogen receptor negative ([place]) 07/27/2019   Neuroretinitis of both eyes 11/14/2018   Papilledema      [name], PT, [name] 4:54 PM  03/19/21   [facility] [address] [place], [place], [address] Phone: [phone]   Fax:  [phone]  Name: [name] MRN: [id number] Date of Birth: 1966-03-23

## 2021-03-26 ENCOUNTER — Other Ambulatory Visit: Payer: Self-pay

## 2021-03-26 ENCOUNTER — Ambulatory Visit (INDEPENDENT_AMBULATORY_CARE_PROVIDER_SITE_OTHER): Payer: BC Managed Care – PPO | Admitting: Physical Therapy

## 2021-03-26 DIAGNOSIS — M25551 Pain in right hip: Secondary | ICD-10-CM | POA: Diagnosis not present

## 2021-03-28 ENCOUNTER — Encounter: Payer: Self-pay | Admitting: Physical Therapy

## 2021-03-28 NOTE — Therapy (Signed)
[facility] [address] [place], [place], [address] Phone: [phone]   Fax:  [phone]  Physical Therapy Treatment  Patient Details  Name: [name] MRN: [id number] Date of Birth: Aug 21, 1965 Referring Provider (PT): Inda Coke   Encounter Date: 03/26/2021   PT End of Session - 03/28/21 1015     Visit Number 3    Number of Visits 12    Date for PT Re-Evaluation 04/25/21    Authorization Type BCBS    PT Start Time 1603    PT Stop Time 9528    PT Time Calculation (min) 40 min    Activity Tolerance Patient tolerated treatment well    Behavior During Therapy [facility] for tasks assessed/performed             Past Medical History:  Diagnosis Date   Arthritis    Cancer ([place])    Left breast   Headache    migraine     Past Surgical History:  Procedure Laterality Date   ARTERY BIOPSY Right 11/11/2018   Procedure: BIOPSY TEMPORAL ARTERY;  Surgeon: Rosetta Posner, MD;  Location: [place];  Service: Vascular;  Laterality: Right;   BREAST LUMPECTOMY WITH RADIOACTIVE SEED AND SENTINEL LYMPH NODE BIOPSY Left 08/05/2019   Procedure: LEFT BREAST LUMPECTOMY WITH RADIOACTIVE SEED AND LEFT AXILLARY SENTINEL LYMPH NODE BIOPSY;  Surgeon: Rolm Bookbinder, MD;  Location: [place];  Service: General;  Laterality: Left;  [place]     x2   PORTACATH PLACEMENT Right 08/05/2019   Procedure: INSERTION PORT-A-CATH WITH ULTRASOUND GUIDANCE;  Surgeon: Rolm Bookbinder, MD;  Location: [place];  Service: General;  Laterality: Right;    There were no vitals filed for this visit.   Subjective Assessment - 03/28/21 1011     Subjective Pt with continued soreness in hip.    Currently in Pain? Yes    Pain Score 8     Pain Location Hip    Pain Orientation Right    Pain Descriptors / Indicators Aching    Pain Type Acute pain    Pain Onset More than a month ago    Pain Frequency Intermittent                                [facility] Adult PT Treatment/Exercise - 03/28/21 0001       Exercises   Exercises Knee/Hip      Knee/Hip Exercises: Stretches   Other Knee/Hip Stretches Piriformis supine mod fig 4, 30 sec s 3 ; seated piriformis x 2 min; ITB with strap 30 sec x 3 on R;      Knee/Hip Exercises: Aerobic   Recumbent Bike L1 x 6 min;      Knee/Hip Exercises: Standing   Hip Abduction 2 sets;10 reps;Both    Lateral Step Up 10 reps;Hand Hold: 1;Step Height: 6"    Forward Step Up 10 reps;Hand Hold: 1;Step Height: 6"      Knee/Hip Exercises: Supine   Bridges 20 reps    Other Supine Knee/Hip Exercises Clam Blue TB x 20 alternating with TA;      Knee/Hip Exercises: Sidelying   Hip ABduction Right;10 reps;2 sets    Clams x15  R;      Modalities   Modalities Moist Heat      Manual Therapy   Manual Therapy Soft tissue mobilization    Soft tissue mobilization DTM/IASTM to  R gr troch and glute med, min, piriformis.                       PT Short Term Goals - 03/18/21 1820       PT SHORT TERM GOAL #1   Title Pt to be independent with initial HEP    Time 2    Period Weeks    Status New    Target Date 03/14/21               PT Long Term Goals - 03/18/21 1821       PT LONG TERM GOAL #1   Title Pt to be independent wtih final HEP    Time 8    Period Weeks    Status New    Target Date 04/25/21      PT LONG TERM GOAL #2   Title Pt to report decreased pain in R hip, to 0-2/10 with activity    Time 8    Period Weeks    Status New    Target Date 04/25/21      PT LONG TERM GOAL #3   Title Pt to demo improved strength of bil hips to at least 4+/5 to improve stability, and ability for stairs, walking.    Time 8    Period Weeks    Status New    Target Date 04/25/21                   Plan - 03/28/21 1016     Clinical Impression Statement Pt with continued soreness in glute and gr troch region. Addressed with manual today for  tissue release in same area. Reviewed ther ex for strenthening and mobility. Plan to continue focus for pain relief in hip, and progress strength as able.    Personal Factors and Comorbidities Time since onset of injury/illness/exacerbation    Examination-Activity Limitations Bed Mobility;Bend;Squat;Stairs;Locomotion Level;Transfers;Stand    Examination-Participation Restrictions Cleaning;Community Activity;Shop    Stability/Clinical Decision Making Stable/Uncomplicated    Rehab Potential Good    PT Frequency 2x / week    PT Duration 8 weeks    PT Treatment/Interventions ADLs/Self Care Home Management;Cryotherapy;Electrical Stimulation;Iontophoresis 4mg /ml Dexamethasone;Moist Heat;Traction;Ultrasound;DME Instruction;Gait training;Neuromuscular re-education;Balance training;Therapeutic exercise;Therapeutic activities;Functional mobility training;Stair training;Patient/family education;Orthotic Fit/Training;Manual techniques;Passive range of motion;Dry needling;Energy conservation;Spinal Manipulations;Joint Manipulations;Taping;Vasopneumatic Device    PT Home Exercise Plan [id number]    Consulted and Agree with Plan of Care Patient             Patient will benefit from skilled therapeutic intervention in order to improve the following deficits and impairments:  Decreased range of motion, Increased muscle spasms, Decreased activity tolerance, Pain, Impaired flexibility, Decreased mobility, Decreased strength  Visit Diagnosis: Pain in right hip     Problem List Patient Active Problem List   Diagnosis Date Noted   Port-A-Cath in place 10/07/2019   Malignant neoplasm of upper-outer quadrant of left breast in female, estrogen receptor negative ([place]) 07/27/2019   Neuroretinitis of both eyes 11/14/2018   Papilledema    [name], PT, [name] 10:19 AM  03/28/21    [facility] [place], [place], [address] Phone: [phone]   Fax:   [phone]  Name: [name] MRN: [id number] Date of Birth: 10/19/1965

## 2021-03-29 ENCOUNTER — Encounter: Payer: Self-pay | Admitting: Physical Therapy

## 2021-03-29 ENCOUNTER — Ambulatory Visit (INDEPENDENT_AMBULATORY_CARE_PROVIDER_SITE_OTHER): Payer: BC Managed Care – PPO | Admitting: Physical Therapy

## 2021-03-29 DIAGNOSIS — M25551 Pain in right hip: Secondary | ICD-10-CM | POA: Diagnosis not present

## 2021-04-01 ENCOUNTER — Encounter: Payer: Self-pay | Admitting: Physical Therapy

## 2021-04-01 NOTE — Therapy (Signed)
[facility] [address] [place], [place], [address] Phone: [phone]   Fax:  [phone]  Physical Therapy Treatment  Patient Details  Name: [name] MRN: [id number] Date of Birth: 02/11/1966 Referring Provider (PT): Inda Coke   Encounter Date: 03/29/2021   PT End of Session - 04/01/21 2029     Visit Number 4    Number of Visits 12    Date for PT Re-Evaluation 04/25/21    Authorization Type BCBS    PT Start Time 1605    PT Stop Time 1645    PT Time Calculation (min) 40 min    Activity Tolerance Patient tolerated treatment well    Behavior During Therapy [facility] for tasks assessed/performed             Past Medical History:  Diagnosis Date   Arthritis    Cancer ([place])    Left breast   Headache    migraine     Past Surgical History:  Procedure Laterality Date   ARTERY BIOPSY Right 11/11/2018   Procedure: BIOPSY TEMPORAL ARTERY;  Surgeon: Rosetta Posner, MD;  Location: [place];  Service: Vascular;  Laterality: Right;   BREAST LUMPECTOMY WITH RADIOACTIVE SEED AND SENTINEL LYMPH NODE BIOPSY Left 08/05/2019   Procedure: LEFT BREAST LUMPECTOMY WITH RADIOACTIVE SEED AND LEFT AXILLARY SENTINEL LYMPH NODE BIOPSY;  Surgeon: Rolm Bookbinder, MD;  Location: [place];  Service: General;  Laterality: Left;  [place]     x2   PORTACATH PLACEMENT Right 08/05/2019   Procedure: INSERTION PORT-A-CATH WITH ULTRASOUND GUIDANCE;  Surgeon: Rolm Bookbinder, MD;  Location: [place];  Service: General;  Laterality: Right;    There were no vitals filed for this visit.   Subjective Assessment - 04/01/21 2028     Subjective Pt states slightly less soreness in hip. Did have diharrea after last visit later that night, unsure if it was from ionto patch or not. Discussed this would be an unlikely side effect.    Patient Stated Goals decreased pain in R hip.    Currently in Pain? Yes    Pain Score 7     Pain Location Hip     Pain Orientation Right    Pain Descriptors / Indicators Aching    Pain Type Acute pain    Pain Onset More than a month ago    Pain Frequency Intermittent                               [facility] Adult PT Treatment/Exercise - 04/01/21 0001       Exercises   Exercises Knee/Hip      Knee/Hip Exercises: Stretches   Other Knee/Hip Stretches Piriformis supine mod fig 4, 30 sec s 3 ; seated piriformis x 2 min; ITB with strap 30 sec x 3 on R;      Knee/Hip Exercises: Aerobic   Recumbent Bike L1 x 8 min;      Knee/Hip Exercises: Standing   Hip Abduction 2 sets;10 reps;Both      Knee/Hip Exercises: Supine   Bridges 20 reps    Other Supine Knee/Hip Exercises Clam Blue TB x 20 alternating with TA;      Knee/Hip Exercises: Sidelying   Hip ABduction Right;10 reps;2 sets    Hip ABduction Limitations circles x 10 cw, ccw,    Clams x15  R;      Modalities  Modalities Moist Heat      Manual Therapy   Manual Therapy Soft tissue mobilization;Joint mobilization    Joint Mobilization hip inf mobs gr 3; long leg distraction for R hip x 2 min;    Soft tissue mobilization DTM/IASTM to R gr troch and glute med, min, piriformis.                       PT Short Term Goals - 03/18/21 1820       PT SHORT TERM GOAL #1   Title Pt to be independent with initial HEP    Time 2    Period Weeks    Status New    Target Date 03/14/21               PT Long Term Goals - 03/18/21 1821       PT LONG TERM GOAL #1   Title Pt to be independent wtih final HEP    Time 8    Period Weeks    Status New    Target Date 04/25/21      PT LONG TERM GOAL #2   Title Pt to report decreased pain in R hip, to 0-2/10 with activity    Time 8    Period Weeks    Status New    Target Date 04/25/21      PT LONG TERM GOAL #3   Title Pt to demo improved strength of bil hips to at least 4+/5 to improve stability, and ability for stairs, walking.    Time 8    Period Weeks     Status New    Target Date 04/25/21                   Plan - 04/01/21 2030     Clinical Impression Statement Pt with continued soreness in R lateral hip and glute with palpation today. Continued exercise and strength progressions, and manual therapy for pain. Pt to benefit from continued care.    Personal Factors and Comorbidities Time since onset of injury/illness/exacerbation    Examination-Activity Limitations Bed Mobility;Bend;Squat;Stairs;Locomotion Level;Transfers;Stand    Examination-Participation Restrictions Cleaning;Community Activity;Shop    Stability/Clinical Decision Making Stable/Uncomplicated    Rehab Potential Good    PT Frequency 2x / week    PT Duration 8 weeks    PT Treatment/Interventions ADLs/Self Care Home Management;Cryotherapy;Electrical Stimulation;Iontophoresis 4mg /ml Dexamethasone;Moist Heat;Traction;Ultrasound;DME Instruction;Gait training;Neuromuscular re-education;Balance training;Therapeutic exercise;Therapeutic activities;Functional mobility training;Stair training;Patient/family education;Orthotic Fit/Training;Manual techniques;Passive range of motion;Dry needling;Energy conservation;Spinal Manipulations;Joint Manipulations;Taping;Vasopneumatic Device    PT Home Exercise Plan [id number]    Consulted and Agree with Plan of Care Patient             Patient will benefit from skilled therapeutic intervention in order to improve the following deficits and impairments:  Decreased range of motion, Increased muscle spasms, Decreased activity tolerance, Pain, Impaired flexibility, Decreased mobility, Decreased strength  Visit Diagnosis: Pain in right hip     Problem List Patient Active Problem List   Diagnosis Date Noted   Port-A-Cath in place 10/07/2019   Malignant neoplasm of upper-outer quadrant of left breast in female, estrogen receptor negative ([place]) 07/27/2019   Neuroretinitis of both eyes 11/14/2018   Papilledema     [name], PT,  [name] 8:32 PM  04/01/21    [facility] [place], [place], [address] Phone: [phone]   Fax:  [phone]  Name: [name] MRN: [id number] Date of Birth:  01/16/1966    

## 2021-04-02 ENCOUNTER — Other Ambulatory Visit: Payer: Self-pay

## 2021-04-02 ENCOUNTER — Encounter: Payer: Self-pay | Admitting: Physical Therapy

## 2021-04-02 ENCOUNTER — Ambulatory Visit (INDEPENDENT_AMBULATORY_CARE_PROVIDER_SITE_OTHER): Payer: BC Managed Care – PPO | Admitting: Physical Therapy

## 2021-04-02 DIAGNOSIS — M25551 Pain in right hip: Secondary | ICD-10-CM | POA: Diagnosis not present

## 2021-04-02 NOTE — Therapy (Signed)
[facility] [address] [place], [place], [address] Phone: [phone]   Fax:  [phone]  Physical Therapy Treatment  Patient Details  Name: [name] MRN: [id number] Date of Birth: [birth year] Referring Provider (PT): Inda Coke   Encounter Date: 04/02/2021   PT End of Session - 04/02/21 1610     Visit Number 5    Number of Visits 12    Date for PT Re-Evaluation 04/25/21    Authorization Type BCBS    PT Start Time 1605    PT Stop Time 1646    PT Time Calculation (min) 41 min    Activity Tolerance Patient tolerated treatment well    Behavior During Therapy [facility] for tasks assessed/performed             Past Medical History:  Diagnosis Date   Arthritis    Cancer ([place])    Left breast   Headache    migraine     Past Surgical History:  Procedure Laterality Date   ARTERY BIOPSY Right 11/11/2018   Procedure: BIOPSY TEMPORAL ARTERY;  Surgeon: Rosetta Posner, MD;  Location: [facility];  Service: Vascular;  Laterality: Right;   BREAST LUMPECTOMY WITH RADIOACTIVE SEED AND SENTINEL LYMPH NODE BIOPSY Left 08/05/2019   Procedure: LEFT BREAST LUMPECTOMY WITH RADIOACTIVE SEED AND LEFT AXILLARY SENTINEL LYMPH NODE BIOPSY;  Surgeon: Rolm Bookbinder, MD;  Location: [place];  Service: General;  Laterality: Left;  [place]     x2   PORTACATH PLACEMENT Right 08/05/2019   Procedure: INSERTION PORT-A-CATH WITH ULTRASOUND GUIDANCE;  Surgeon: Rolm Bookbinder, MD;  Location: [facility];  Service: General;  Laterality: Right;    There were no vitals filed for this visit.   Subjective Assessment - 04/02/21 1611     Subjective Pt states continued pain in hip. Is sleeping through night, but has trouble getting comfortable due to pain. Has most pain in bed and during work day, getting up and down.    Patient Stated Goals decreased pain in R hip.    Currently in Pain? Yes    Pain Score 7     Pain Location Hip    Pain  Orientation Right    Pain Descriptors / Indicators Aching    Pain Type Acute pain    Pain Onset More than a month ago    Pain Frequency Intermittent    Aggravating Factors  increased standing, walking, getting up and down, at night.                               [facility] Adult PT Treatment/Exercise - 04/02/21 0001       Exercises   Exercises Knee/Hip      Knee/Hip Exercises: Stretches   Other Knee/Hip Stretches Piriformis supine mod fig 4, 30 sec s 3 ; seated piriformis x 2 min; ITB with strap 30 sec x 3 on R;    Other Knee/Hip Stretches SKTC 30 sec x 2 bil; pelvic tilts x 15;      Knee/Hip Exercises: Aerobic   Recumbent Bike L2 x 7 min;      Knee/Hip Exercises: Standing   Hip Abduction 2 sets;10 reps;Both    Abduction Limitations YTB    Lateral Step Up 10 reps;Hand Hold: 1;Step Height: 6"    Forward Step Up 10 reps;Step Height: 6";Hand Hold: 0    Other Standing Knee Exercises lateral walk without  band 15 ft x 4;      Knee/Hip Exercises: Seated   Sit to Sand 10 reps   with BTB at thighs     Knee/Hip Exercises: Supine   Bridges --    Straight Leg Raises 20 reps;Both    Straight Leg Raises Limitations with TA    Other Supine Knee/Hip Exercises Supine march x 20 with TA.    Other Supine Knee/Hip Exercises TA 5 sec x 10;      Knee/Hip Exercises: Sidelying   Hip ABduction --    Hip ABduction Limitations --    Clams --      Modalities   Modalities Moist Heat      Manual Therapy   Manual Therapy Soft tissue mobilization;Joint mobilization    Manual therapy comments skilled palpation and monitoring of soft tissue wtih dry needling.    Joint Mobilization hip inf mobs gr 3; long leg distraction for R hip and lumbar pump x 2 min; Lumbar PA mobs    Soft tissue mobilization DTM to R low lumbar and SI              Trigger Point Dry Needling - 04/02/21 0001     Consent Given? Yes    Education Handout Provided Previously provided    Muscles Treated  Back/Hip Lumbar multifidi    Lumbar multifidi Response Palpable increased muscle length   L2-4 on R;                  PT Education - 04/02/21 1612     Education Details reviewed HEP    Person(s) Educated Patient    Methods Explanation;Tactile cues;Verbal cues;Demonstration    Comprehension Verbalized understanding;Returned demonstration;Verbal cues required;Tactile cues required;Need further instruction              PT Short Term Goals - 03/18/21 1820       PT SHORT TERM GOAL #1   Title Pt to be independent with initial HEP    Time 2    Period Weeks    Status New    Target Date 03/14/21               PT Long Term Goals - 03/18/21 1821       PT LONG TERM GOAL #1   Title Pt to be independent wtih final HEP    Time 8    Period Weeks    Status New    Target Date 04/25/21      PT LONG TERM GOAL #2   Title Pt to report decreased pain in R hip, to 0-2/10 with activity    Time 8    Period Weeks    Status New    Target Date 04/25/21      PT LONG TERM GOAL #3   Title Pt to demo improved strength of bil hips to at least 4+/5 to improve stability, and ability for stairs, walking.    Time 8    Period Weeks    Status New    Target Date 04/25/21                   Plan - 04/02/21 1654     Clinical Impression Statement Pt has not gotten significant pain relief from focus on hip, so more focus on lumbar/SI with ther ex and manual today, in attempts for pain relief in R SI and glute region. Will assess effects next visit. She has significant tenderness in R paraspinals and into  R SI and glute min with palpation today. Reviewed active TA, pt with improved ability after education and practice. 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    Personal Factors and Comorbidities Time since  onset of injury/illness/exacerbation    Examination-Activity Limitations Bed Mobility;Bend;Squat;Stairs;Locomotion Level;Transfers;Stand    Examination-Participation Restrictions Cleaning;Community Activity;Shop    Stability/Clinical Decision Making Stable/Uncomplicated    Rehab Potential Good    PT Frequency 2x / week    PT Duration 8 weeks    PT Treatment/Interventions ADLs/Self Care Home Management;Cryotherapy;Electrical Stimulation;Iontophoresis 4mg /ml Dexamethasone;Moist Heat;Traction;Ultrasound;DME Instruction;Gait training;Neuromuscular re-education;Balance training;Therapeutic exercise;Therapeutic activities;Functional mobility training;Stair training;Patient/family education;Orthotic Fit/Training;Manual techniques;Passive range of motion;Dry needling;Energy conservation;Spinal Manipulations;Joint Manipulations;Taping;Vasopneumatic Device    PT Home Exercise Plan [id number]    Consulted and Agree with Plan of Care Patient             Patient will benefit from skilled therapeutic intervention in order to improve the following deficits and impairments:  Decreased range of motion, Increased muscle spasms, Decreased activity tolerance, Pain, Impaired flexibility, Decreased mobility, Decreased strength  Visit Diagnosis: Pain in right hip     Problem List Patient Active Problem List   Diagnosis Date Noted   Port-A-Cath in place 10/07/2019   Malignant neoplasm of upper-outer quadrant of left breast in female, estrogen receptor negative ([place]) 07/27/2019   Neuroretinitis of both eyes 11/14/2018   Papilledema    [name], PT, [name] 4:57 PM  04/02/21    [facility] [address] [place], [place], [address] Phone: [phone]   Fax:  [phone]  Name: [name] MRN: [id number] Date of Birth: [birth year]

## 2021-04-05 ENCOUNTER — Ambulatory Visit (INDEPENDENT_AMBULATORY_CARE_PROVIDER_SITE_OTHER): Payer: BC Managed Care – PPO | Admitting: Physical Therapy

## 2021-04-05 ENCOUNTER — Encounter: Payer: Self-pay | Admitting: Physical Therapy

## 2021-04-05 ENCOUNTER — Other Ambulatory Visit: Payer: Self-pay

## 2021-04-05 DIAGNOSIS — M25551 Pain in right hip: Secondary | ICD-10-CM

## 2021-04-05 NOTE — Therapy (Signed)
[facility] [address] [place], [place], [address] Phone: [phone]   Fax:  [phone]  Physical Therapy Treatment  Patient Details  Name: [name] MRN: [id number] Date of Birth: 1965/07/23 Referring Provider (PT): Inda Coke   Encounter Date: 04/05/2021   PT End of Session - 04/05/21 1647     Visit Number 6    Number of Visits 12    Date for PT Re-Evaluation 04/25/21    Authorization Type BCBS    PT Start Time 1605    PT Stop Time 1645    PT Time Calculation (min) 40 min    Activity Tolerance Patient tolerated treatment well    Behavior During Therapy [facility] for tasks assessed/performed             Past Medical History:  Diagnosis Date   Arthritis    Cancer ([place])    Left breast   Headache    migraine     Past Surgical History:  Procedure Laterality Date   ARTERY BIOPSY Right 11/11/2018   Procedure: BIOPSY TEMPORAL ARTERY;  Surgeon: Rosetta Posner, MD;  Location: [facility];  Service: Vascular;  Laterality: Right;   BREAST LUMPECTOMY WITH RADIOACTIVE SEED AND SENTINEL LYMPH NODE BIOPSY Left 08/05/2019   Procedure: LEFT BREAST LUMPECTOMY WITH RADIOACTIVE SEED AND LEFT AXILLARY SENTINEL LYMPH NODE BIOPSY;  Surgeon: Rolm Bookbinder, MD;  Location: [facility];  Service: General;  Laterality: Left;  [place]     x2   PORTACATH PLACEMENT Right 08/05/2019   Procedure: INSERTION PORT-A-CATH WITH ULTRASOUND GUIDANCE;  Surgeon: Rolm Bookbinder, MD;  Location: [facility];  Service: General;  Laterality: Right;    There were no vitals filed for this visit.   Subjective Assessment - 04/05/21 1646     Subjective Pt states some decreased pain in R hip.    Currently in Pain? Yes    Pain Score 5     Pain Location Hip    Pain Orientation Right    Pain Descriptors / Indicators Aching    Pain Type Acute pain    Pain Onset More than a month ago    Pain Frequency Intermittent    Pain Score 6    Pain Location  Back    Pain Orientation Right    Pain Descriptors / Indicators Aching    Pain Type Chronic pain    Pain Onset More than a month ago    Pain Frequency Intermittent                               [facility] Adult PT Treatment/Exercise - 04/05/21 0001       Exercises   Exercises Knee/Hip      Knee/Hip Exercises: Stretches   Other Knee/Hip Stretches seated piriformis x 2 min;    Other Knee/Hip Stretches SKTC 30 sec x 2 bil; pelvic tilts x 15;      Knee/Hip Exercises: Aerobic   Recumbent Bike L2 x6  min;      Knee/Hip Exercises: Standing   Hip Abduction 2 sets;10 reps;Both    Abduction Limitations YTB    Other Standing Knee Exercises lateral walk without band 15 ft x 4;      Knee/Hip Exercises: Seated   Sit to Sand 10 reps   with BTB at thighs     Knee/Hip Exercises: Supine   Bridges 20 reps    Straight Leg  Raises 20 reps;Both    Straight Leg Raises Limitations with TA      Modalities   Modalities Moist Heat      Manual Therapy   Manual Therapy Soft tissue mobilization;Joint mobilization    Manual therapy comments skilled palpation and monitoring of soft tissue wtih dry needling.    Joint Mobilization hip inf mobs gr 3; long leg distraction for R hip and lumbar pump x 2 min; Lumbar PA mobs    Soft tissue mobilization DTM to R low lumbar into R glute                       PT Short Term Goals - 03/18/21 1820       PT SHORT TERM GOAL #1   Title Pt to be independent with initial HEP    Time 2    Period Weeks    Status New    Target Date 03/14/21               PT Long Term Goals - 03/18/21 1821       PT LONG TERM GOAL #1   Title Pt to be independent wtih final HEP    Time 8    Period Weeks    Status New    Target Date 04/25/21      PT LONG TERM GOAL #2   Title Pt to report decreased pain in R hip, to 0-2/10 with activity    Time 8    Period Weeks    Status New    Target Date 04/25/21      PT LONG TERM GOAL #3   Title  Pt to demo improved strength of bil hips to at least 4+/5 to improve stability, and ability for stairs, walking.    Time 8    Period Weeks    Status New    Target Date 04/25/21                   Plan - 04/05/21 1648     Clinical Impression Statement Pt with decreased tenderness in R gr trochanter and glute  with palpation today. She has quite a bit of soreness in R low lumbar region with palpation and manual today. Will continue focus on low back pain, with mobility, core strength, and continue strengthening for hips. Pt to benefit from continued care.    Personal Factors and Comorbidities Time since onset of injury/illness/exacerbation    Examination-Activity Limitations Bed Mobility;Bend;Squat;Stairs;Locomotion Level;Transfers;Stand    Examination-Participation Restrictions Cleaning;Community Activity;Shop    Stability/Clinical Decision Making Stable/Uncomplicated    Rehab Potential Good    PT Frequency 2x / week    PT Duration 8 weeks    PT Treatment/Interventions ADLs/Self Care Home Management;Cryotherapy;Electrical Stimulation;Iontophoresis 4mg /ml Dexamethasone;Moist Heat;Traction;Ultrasound;DME Instruction;Gait training;Neuromuscular re-education;Balance training;Therapeutic exercise;Therapeutic activities;Functional mobility training;Stair training;Patient/family education;Orthotic Fit/Training;Manual techniques;Passive range of motion;Dry needling;Energy conservation;Spinal Manipulations;Joint Manipulations;Taping;Vasopneumatic Device    PT Home Exercise Plan [id number]    Consulted and Agree with Plan of Care Patient             Patient will benefit from skilled therapeutic intervention in order to improve the following deficits and impairments:  Decreased range of motion, Increased muscle spasms, Decreased activity tolerance, Pain, Impaired flexibility, Decreased mobility, Decreased strength  Visit Diagnosis: Pain in right hip     Problem List Patient Active  Problem List   Diagnosis Date Noted   Port-A-Cath in place 10/07/2019   Malignant neoplasm of upper-outer quadrant of  left breast in female, estrogen receptor negative ([place]) 07/27/2019   Neuroretinitis of both eyes 11/14/2018   Papilledema    [name], PT, [name] 4:50 PM  04/05/21    [facility] [address] [place], [place], [address] Phone: [phone]   Fax:  [phone]  Name: [name] MRN: [id number] Date of Birth: 1965-06-28

## 2021-04-09 ENCOUNTER — Other Ambulatory Visit: Payer: Self-pay

## 2021-04-09 ENCOUNTER — Ambulatory Visit (INDEPENDENT_AMBULATORY_CARE_PROVIDER_SITE_OTHER): Payer: BC Managed Care – PPO | Admitting: Physical Therapy

## 2021-04-09 DIAGNOSIS — M549 Dorsalgia, unspecified: Secondary | ICD-10-CM

## 2021-04-09 DIAGNOSIS — M25551 Pain in right hip: Secondary | ICD-10-CM | POA: Diagnosis not present

## 2021-04-09 NOTE — Therapy (Signed)
[facility] [address] [place], [place], [address] Phone: [phone]   Fax:  [phone]  Physical Therapy Treatment  Patient Details  Name: ZAILAH ZAGAMI MRN: [id number] Date of Birth: [birth year] Referring Provider (PT): Inda Coke   Encounter Date: 04/09/2021   PT End of Session - 04/09/21 1606     Visit Number 7    Number of Visits 12    Date for PT Re-Evaluation 04/25/21    Authorization Type BCBS    PT Start Time 1602    PT Stop Time 1645    PT Time Calculation (min) 43 min    Activity Tolerance Patient tolerated treatment well    Behavior During Therapy [facility] for tasks assessed/performed             Past Medical History:  Diagnosis Date   Arthritis    Cancer ([place])    Left breast   Headache    migraine     Past Surgical History:  Procedure Laterality Date   ARTERY BIOPSY Right 11/11/2018   Procedure: BIOPSY TEMPORAL ARTERY;  Surgeon: Rosetta Posner, MD;  Location: [facility];  Service: Vascular;  Laterality: Right;   BREAST LUMPECTOMY WITH RADIOACTIVE SEED AND SENTINEL LYMPH NODE BIOPSY Left 08/05/2019   Procedure: LEFT BREAST LUMPECTOMY WITH RADIOACTIVE SEED AND LEFT AXILLARY SENTINEL LYMPH NODE BIOPSY;  Surgeon: Rolm Bookbinder, MD;  Location: [facility];  Service: General;  Laterality: Left;  [place]     x2   PORTACATH PLACEMENT Right 08/05/2019   Procedure: INSERTION PORT-A-CATH WITH ULTRASOUND GUIDANCE;  Surgeon: Rolm Bookbinder, MD;  Location: [facility];  Service: General;  Laterality: Right;    There were no vitals filed for this visit.   Subjective Assessment - 04/09/21 1604     Subjective Reports some soreness.  States she doesn't know if she has noticed a difference yet but thinks maybe the needling is helping    Currently in Pain? Yes    Pain Score 5     Pain Location Back   and hip   Pain Orientation Right    Pain Descriptors / Indicators Sore    Pain Onset More than a month  ago    Pain Onset More than a month ago                [facility] PT Assessment - 04/09/21 0001       Assessment   Medical Diagnosis R hip pain    Referring Provider (PT) Inda Coke                           [facility] Adult PT Treatment/Exercise - 04/09/21 0001       Knee/Hip Exercises: Stretches   Other Knee/Hip Stretches seated piriformis x 2 min Bil      Knee/Hip Exercises: Aerobic   Recumbent Bike L2 x6  min;      Knee/Hip Exercises: Standing   Hip Abduction 2 sets;10 reps;Both    Abduction Limitations YTB    Lateral Step Up 10 reps;Step Height: 6"    Other Standing Knee Exercises lateral walk with blue band 15 ft x 4;      Knee/Hip Exercises: Seated   Sit to Sand 10 reps   with blue theraband at knees     Knee/Hip Exercises: Supine   Bridges 10 reps;Both   kickouts     Knee/Hip Exercises: Sidelying   Hip ABduction  10 reps;2 sets;Both    Hip ABduction Limitations circles x 10 cw, ccw,      Manual Therapy   Manual Therapy Soft tissue mobilization    Joint Mobilization R PA to sacral border    Soft tissue mobilization soft tissue mobilizaiton and Instrument assisted soft tissue mobilization with EDGE tool to right hip musculature and right lumbar musculature                     PT Education - 04/09/21 1614     Education Details on difference between orthopedic specialist and primary care provider and ebenfetis with following up with specialist.    Person(s) Educated Patient    Methods Explanation    Comprehension Verbalized understanding              PT Short Term Goals - 03/18/21 1820       PT SHORT TERM GOAL #1   Title Pt to be independent with initial HEP    Time 2    Period Weeks    Status New    Target Date 03/14/21               PT Long Term Goals - 03/18/21 1821       PT LONG TERM GOAL #1   Title Pt to be independent wtih final HEP    Time 8    Period Weeks    Status New    Target Date 04/25/21       PT LONG TERM GOAL #2   Title Pt to report decreased pain in R hip, to 0-2/10 with activity    Time 8    Period Weeks    Status New    Target Date 04/25/21      PT LONG TERM GOAL #3   Title Pt to demo improved strength of bil hips to at least 4+/5 to improve stability, and ability for stairs, walking.    Time 8    Period Weeks    Status New    Target Date 04/25/21                   Plan - 04/09/21 1641     Clinical Impression Statement Patient tolerated session well. Verbal cues and prior demonstration with all exercises to help improve form. Patient reported pain/discomfort with hip circles in side lying on right side. Reduced soreness with posterior hip musculature but continued significant soreness just posterior to right trochanter. Patient with reports of increased strength but overall minimal improvement in symptoms with multiple interventions performed at this time. Discussed following up with orthopedic specialist and provided patient with business card for contact information.    Personal Factors and Comorbidities Time since onset of injury/illness/exacerbation    Examination-Activity Limitations Bed Mobility;Bend;Squat;Stairs;Locomotion Level;Transfers;Stand    Examination-Participation Restrictions Cleaning;Community Activity;Shop    Stability/Clinical Decision Making Stable/Uncomplicated    Rehab Potential Good    PT Frequency 2x / week    PT Duration 8 weeks    PT Treatment/Interventions ADLs/Self Care Home Management;Cryotherapy;Electrical Stimulation;Iontophoresis 4mg /ml Dexamethasone;Moist Heat;Traction;Ultrasound;DME Instruction;Gait training;Neuromuscular re-education;Balance training;Therapeutic exercise;Therapeutic activities;Functional mobility training;Stair training;Patient/family education;Orthotic Fit/Training;Manual techniques;Passive range of motion;Dry needling;Energy conservation;Spinal Manipulations;Joint Manipulations;Taping;Vasopneumatic Device     PT Home Exercise Plan [id number]    Consulted and Agree with Plan of Care Patient             Patient will benefit from skilled therapeutic intervention in order to improve the following deficits and impairments:  Decreased range of  motion, Increased muscle spasms, Decreased activity tolerance, Pain, Impaired flexibility, Decreased mobility, Decreased strength  Visit Diagnosis: Pain in right hip  Right hip pain  Back pain, unspecified back location, unspecified back pain laterality, unspecified chronicity     Problem List Patient Active Problem List   Diagnosis Date Noted   Port-A-Cath in place 10/07/2019   Malignant neoplasm of upper-outer quadrant of left breast in female, estrogen receptor negative ([place]) 07/27/2019   Neuroretinitis of both eyes 11/14/2018   Papilledema    [name], PT, [name] 4:47 PM  04/09/21   [facility] [address] [place], [place], [address] Phone: [phone]   Fax:  [phone]  Name: INESSA WARDROP MRN: [id number] Date of Birth: [birth year]

## 2021-04-11 ENCOUNTER — Other Ambulatory Visit: Payer: Self-pay

## 2021-04-11 ENCOUNTER — Ambulatory Visit (INDEPENDENT_AMBULATORY_CARE_PROVIDER_SITE_OTHER): Payer: BC Managed Care – PPO | Admitting: Physical Therapy

## 2021-04-11 DIAGNOSIS — M549 Dorsalgia, unspecified: Secondary | ICD-10-CM | POA: Diagnosis not present

## 2021-04-11 DIAGNOSIS — M25551 Pain in right hip: Secondary | ICD-10-CM | POA: Diagnosis not present

## 2021-04-11 NOTE — Therapy (Signed)
[facility] [address] [place], [place], [address] Phone: [phone]   Fax:  [phone]  Physical Therapy Treatment  Patient Details  Name: [name] MRN: [id number] Date of Birth: Dec 04, 1965 Referring Provider (PT): Inda Coke   Encounter Date: 04/11/2021   PT End of Session - 04/11/21 1106     Visit Number 8    Number of Visits 12    Date for PT Re-Evaluation 04/25/21    Authorization Type BCBS    PT Start Time 1101    PT Stop Time 1145    PT Time Calculation (min) 44 min    Activity Tolerance Patient tolerated treatment well    Behavior During Therapy [facility] for tasks assessed/performed             Past Medical History:  Diagnosis Date   Arthritis    Cancer ([place])    Left breast   Headache    migraine     Past Surgical History:  Procedure Laterality Date   ARTERY BIOPSY Right 11/11/2018   Procedure: BIOPSY TEMPORAL ARTERY;  Surgeon: Rosetta Posner, MD;  Location: [facility];  Service: Vascular;  Laterality: Right;   BREAST LUMPECTOMY WITH RADIOACTIVE SEED AND SENTINEL LYMPH NODE BIOPSY Left 08/05/2019   Procedure: LEFT BREAST LUMPECTOMY WITH RADIOACTIVE SEED AND LEFT AXILLARY SENTINEL LYMPH NODE BIOPSY;  Surgeon: Rolm Bookbinder, MD;  Location: [facility];  Service: General;  Laterality: Left;  [place]     x2   PORTACATH PLACEMENT Right 08/05/2019   Procedure: INSERTION PORT-A-CATH WITH ULTRASOUND GUIDANCE;  Surgeon: Rolm Bookbinder, MD;  Location: [facility];  Service: General;  Laterality: Right;    There were no vitals filed for this visit.   Subjective Assessment - 04/11/21 1105     Subjective Reports that she feels about the same in her hips.    Patient Stated Goals decreased pain in R hip.    Currently in Pain? Yes    Pain Score 5     Pain Location Hip    Pain Onset More than a month ago    Pain Onset More than a month ago                [facility] PT Assessment - 04/11/21 0001        Assessment   Medical Diagnosis R hip pain    Referring Provider (PT) Inda Coke                           [facility] Adult PT Treatment/Exercise - 04/11/21 0001       Knee/Hip Exercises: Stretches   Other Knee/Hip Stretches seated piriformis x 2 min Bil    Other Knee/Hip Stretches cross body stretch in supine with strap; SKC 30 seconds hold x5 B      Knee/Hip Exercises: Aerobic   Recumbent Bike L2 x5  min;      Knee/Hip Exercises: Standing   Hip Abduction 2 sets;Both;15 reps    Abduction Limitations YTB    Forward Step Up 10 reps;Step Height: 6";Hand Hold: 0   bilaterally     Knee/Hip Exercises: Supine   Bridges with Clamshell 10 reps;2 sets   5" hold   Other Supine Knee/Hip Exercises posterior pelvic tilts 5" holds x15      Knee/Hip Exercises: Sidelying   Hip ABduction 10 reps;2 sets;Both    Hip ABduction Limitations circles x 10 cw, ccw,  Manual Therapy   Manual Therapy Joint mobilization;Passive ROM    Manual therapy comments skilled palpation and monitoring of soft tissue wtih dry needling.    Joint Mobilization right hip long axis traction, right hip inferior mobilization; lumbar PAs    Soft tissue mobilization soft tissue mobilizaiton right hip musculature and right lumbar paraspinals    Passive ROM in all directions of right hip - holds at end range                     PT Education - 04/11/21 1111     Education Details on current presenation, on lack of progress made thus far and if any interventions have helped more than others. how diffierent chairs may affect her symptoms in her hips/back    Person(s) Educated Patient    Methods Explanation    Comprehension Verbalized understanding              PT Short Term Goals - 03/18/21 1820       PT SHORT TERM GOAL #1   Title Pt to be independent with initial HEP    Time 2    Period Weeks    Status New    Target Date 03/14/21               PT Long Term Goals -  03/18/21 1821       PT LONG TERM GOAL #1   Title Pt to be independent wtih final HEP    Time 8    Period Weeks    Status New    Target Date 04/25/21      PT LONG TERM GOAL #2   Title Pt to report decreased pain in R hip, to 0-2/10 with activity    Time 8    Period Weeks    Status New    Target Date 04/25/21      PT LONG TERM GOAL #3   Title Pt to demo improved strength of bil hips to at least 4+/5 to improve stability, and ability for stairs, walking.    Time 8    Period Weeks    Status New    Target Date 04/25/21                   Plan - 04/11/21 1133     Clinical Impression Statement Continued to progress strengthening exercises with good form and tolerance. Reduced sensitivity to light touch in right lateral hip and lumbar spine. Overall right hip ROM has improved in all directions since start of session. Continued with manual and reduced resting muscle tone noted. Will continue with current POC. Encouraged patient to follow up with specialist and provided patient with bussiness card.    Personal Factors and Comorbidities Time since onset of injury/illness/exacerbation    Examination-Activity Limitations Bed Mobility;Bend;Squat;Stairs;Locomotion Level;Transfers;Stand    Examination-Participation Restrictions Cleaning;Community Activity;Shop    Stability/Clinical Decision Making Stable/Uncomplicated    Rehab Potential Good    PT Frequency 2x / week    PT Duration 8 weeks    PT Treatment/Interventions ADLs/Self Care Home Management;Cryotherapy;Electrical Stimulation;Iontophoresis 4mg /ml Dexamethasone;Moist Heat;Traction;Ultrasound;DME Instruction;Gait training;Neuromuscular re-education;Balance training;Therapeutic exercise;Therapeutic activities;Functional mobility training;Stair training;Patient/family education;Orthotic Fit/Training;Manual techniques;Passive range of motion;Dry needling;Energy conservation;Spinal Manipulations;Joint Manipulations;Taping;Vasopneumatic  Device    PT Home Exercise Plan [id number]    Consulted and Agree with Plan of Care Patient             Patient will benefit from skilled therapeutic intervention in order to improve the following  deficits and impairments:  Decreased range of motion, Increased muscle spasms, Decreased activity tolerance, Pain, Impaired flexibility, Decreased mobility, Decreased strength  Visit Diagnosis: Pain in right hip  Right hip pain  Back pain, unspecified back location, unspecified back pain laterality, unspecified chronicity     Problem List Patient Active Problem List   Diagnosis Date Noted   Port-A-Cath in place 10/07/2019   Malignant neoplasm of upper-outer quadrant of left breast in female, estrogen receptor negative ([place]) 07/27/2019   Neuroretinitis of both eyes 11/14/2018   Papilledema    [name], PT, [name] 11:48 AM  04/11/21   [facility] [place] [place], [place], [address] Phone: [phone]   Fax:  [phone]  Name: GRACIELLA ARMENT MRN: [id number] Date of Birth: 07-13-1965

## 2021-04-18 ENCOUNTER — Other Ambulatory Visit: Payer: Self-pay

## 2021-04-18 ENCOUNTER — Ambulatory Visit (INDEPENDENT_AMBULATORY_CARE_PROVIDER_SITE_OTHER): Payer: BC Managed Care – PPO | Admitting: Physical Therapy

## 2021-04-18 ENCOUNTER — Encounter: Payer: Self-pay | Admitting: Physical Therapy

## 2021-04-18 DIAGNOSIS — M25551 Pain in right hip: Secondary | ICD-10-CM | POA: Diagnosis not present

## 2021-04-18 DIAGNOSIS — M549 Dorsalgia, unspecified: Secondary | ICD-10-CM

## 2021-04-18 NOTE — Progress Notes (Signed)
[name] D.[facility] Phone: [phone]   Assessment and Plan:     1. Chronic bilateral low back pain without sciatica 2. Right hip pain 3. Greater trochanteric bursitis of right hip 4. Somatic dysfunction of lumbar region 5. Somatic dysfunction of pelvic region 6. Somatic dysfunction of sacral region -Chronic with exacerbation, initial sports medicine visit - Multiple musculoskeletal complaints including right lateral hip and low back pain that not significantly improved with physical therapy alone - Suspect greater trochanteric bursitis of right hip as a primary cause of patient's pain - Patient elected for right greater trochanteric CSI.  Tolerated well per note below --Continue physical therapy and HEP - Patient elected to try OMT today.  Tolerated well per note below. - Decision today to treat with OMT was based on Physical Exam  After verbal consent patient was treated with HVLA (high velocity low amplitude), ME (muscle energy), FPR (flex positional release), ST (soft tissue), PC/PD (Pelvic Compression/ Pelvic Decompression) techniques in sacral, lumbar, and pelvic areas. Patient tolerated the procedure well with improvement in symptoms.  Patient educated on potential side effects of soreness and recommended to rest, hydrate, and use Tylenol as needed for pain control.     Pertinent previous records reviewed include PT note 04/18/2021, PT note 04/11/2021, hip x-ray 02/2021  Procedure: Greater trochanteric bursal injection Side: Right  Risks explained and consent was given verbally. The site was cleaned with alcohol prep. A steroid injection was performed with patient in the lateral side-lying position at area of maximum tenderness over greater trochanter using 36mL of 1% lidocaine without epinephrine and 63mL of kenalog 40mg /ml. This was well tolerated and resulted in symptomatic relief.  Needle was  removed, hemostasis achieved, and post injection instructions were explained.  Pt was advised to call or return to clinic if these symptoms worsen or fail to improve as anticipated.     Follow Up: 3 weeks for reevaluation.  Could consider SI joint injection on right versus repeat OMT versus advanced imaging based on patient's symptoms   Subjective:   I, [name], am serving as a Education administrator for Doctor Glennon Mac  Chief Complaint: right sided hip and back pain   HPI:   04/19/2021 Patient is a 55 year old female complaining of right sided hip and back pain. Patient states that she has had about 9 sessions of PT and hasn't helped, first started hurting for 6-8 months and the pain is so bad that she cant sleep, no MOI does state that she has had back pain for years. Localized to the hip, if she sits for a long time and then gets up that hurts the most trying to get comfortable when going to bed. Sometimes take ib or tylenol mostly when she cant sleep   Relevant Historical Information: History of breast cancer  Additional pertinent review of systems negative.   Current Outpatient Medications:    atorvastatin (LIPITOR) 20 MG tablet, Take 1 tablet (20 mg total) by mouth daily., Disp: 90 tablet, Rfl: 1   ibuprofen (ADVIL) 200 MG tablet, Take 400-600 mg by mouth every 8 (eight) hours as needed for moderate pain., Disp: , Rfl:    Multiple Vitamin (MULTIVITAMIN WITH MINERALS) TABS tablet, Take 1 tablet by mouth daily. One-A-Day for Women 50+, Disp: , Rfl:    Objective:     Vitals:   04/19/21 1031  BP: 122/80  Pulse: 97  SpO2: 98%  Weight: 160  lb (72.6 kg)  Height: 5' (1.524 m)      Body mass index is 31.25 kg/m.    Physical Exam:      OMT Physical Exam:  ASIS Compression Test: Positive Right Sacrum: Positive sphinx, TTP bilaterally sacral bases, worse on right   Lumbar: TTP paraspinal, L5 rotated right Pelvis: Right anterior innominate with out flare  General: awake,  alert, and oriented no acute distress, nontoxic Skin: no suspicious lesions or rashes Neuro:sensation intact distally with no dificits, normal muscle tone, no atrophy, strength 5/5 in all tested lower ext groups Psych: normal mood and affect, speech clear  Right hip: No deformity, swelling or wasting ROM Fexion 90, ext 30, IR 45, ER 45 TTP greater trochanter, mildly over gluteal musculature and right lumbar paraspinal muscles NTTP over the hip flexor  Negative log roll with FROM Negative FABER Negative FADIR Positive piriformis test Negative trendelenberg Gait normal    Electronically signed by:  [name] D.[facility] 11:09 AM 04/19/21

## 2021-04-18 NOTE — Therapy (Signed)
[facility] [address] [place], [place], [address] Phone: [phone]   Fax:  [phone]  Physical Therapy Treatment  Patient Details  Name: [name] MRN: [id number] Date of Birth: 02/23/1966 Referring Provider (PT): Inda Coke   Encounter Date: 04/18/2021   PT End of Session - 04/18/21 1200     Visit Number 9    Number of Visits 12    Date for PT Re-Evaluation 04/25/21    Authorization Type BCBS    PT Start Time 1105    PT Stop Time 1145    PT Time Calculation (min) 40 min    Activity Tolerance Patient tolerated treatment well    Behavior During Therapy [facility] for tasks assessed/performed             Past Medical History:  Diagnosis Date   Arthritis    Cancer ([place])    Left breast   Headache    migraine     Past Surgical History:  Procedure Laterality Date   ARTERY BIOPSY Right 11/11/2018   Procedure: BIOPSY TEMPORAL ARTERY;  Surgeon: Rosetta Posner, MD;  Location: [facility];  Service: Vascular;  Laterality: Right;   BREAST LUMPECTOMY WITH RADIOACTIVE SEED AND SENTINEL LYMPH NODE BIOPSY Left 08/05/2019   Procedure: LEFT BREAST LUMPECTOMY WITH RADIOACTIVE SEED AND LEFT AXILLARY SENTINEL LYMPH NODE BIOPSY;  Surgeon: Rolm Bookbinder, MD;  Location: [facility];  Service: General;  Laterality: Left;  [place]     x2   PORTACATH PLACEMENT Right 08/05/2019   Procedure: INSERTION PORT-A-CATH WITH ULTRASOUND GUIDANCE;  Surgeon: Rolm Bookbinder, MD;  Location: [facility];  Service: General;  Laterality: Right;    There were no vitals filed for this visit.   Subjective Assessment - 04/18/21 1159     Subjective Pt reports continued pain in R hip.    Patient Stated Goals decreased pain in R hip.    Currently in Pain? Yes    Pain Score 5     Pain Location Hip    Pain Orientation Right    Pain Descriptors / Indicators Sore    Pain Type Acute pain    Pain Onset More than a month ago    Pain Frequency  Intermittent                               [facility] Adult PT Treatment/Exercise - 04/18/21 0001       Knee/Hip Exercises: Stretches   Piriformis Stretch 3 reps;30 seconds    Piriformis Stretch Limitations supine, fig 4 and IR across body    Other Knee/Hip Stretches --    Other Knee/Hip Stretches --      Knee/Hip Exercises: Aerobic   Recumbent Bike L2 x 6  min;      Knee/Hip Exercises: Standing   Hip Abduction 2 sets;Both;15 reps    Abduction Limitations YTB    Forward Step Up --   bilaterally   Other Standing Knee Exercises hip ext x 15 ea bil;    Other Standing Knee Exercises Standing TA w RTB UE flexion x 20;      Knee/Hip Exercises: Seated   Sit to Sand 15 reps   7lb hold     Knee/Hip Exercises: Supine   Bridges with Clamshell 10 reps;2 sets   5" hold   Straight Leg Raises 15 reps;Both    Straight Leg Raises Limitations with TA  Other Supine Knee/Hip Exercises Clams BLTB x 20 alternating;    Other Supine Knee/Hip Exercises posterior pelvic tilts 5" holds x15      Knee/Hip Exercises: Sidelying   Hip ABduction 10 reps;2 sets;Both    Hip ABduction Limitations --      Knee/Hip Exercises: Prone   Other Prone Exercises Quadruped LE ext x10 bil, with TA.      Manual Therapy   Manual Therapy Joint mobilization;Passive ROM    Joint Mobilization right hip long axis traction, right hip inferior mobilization;    Soft tissue mobilization --    Passive ROM --                     PT Education - 04/18/21 1200     Education Details reviewed PT plan , progress.    Person(s) Educated Patient    Methods Explanation;Demonstration;Verbal cues    Comprehension Verbalized understanding;Returned demonstration;Verbal cues required;Need further instruction              PT Short Term Goals - 03/18/21 1820       PT SHORT TERM GOAL #1   Title Pt to be independent with initial HEP    Time 2    Period Weeks    Status New    Target Date 03/14/21                PT Long Term Goals - 03/18/21 1821       PT LONG TERM GOAL #1   Title Pt to be independent wtih final HEP    Time 8    Period Weeks    Status New    Target Date 04/25/21      PT LONG TERM GOAL #2   Title Pt to report decreased pain in R hip, to 0-2/10 with activity    Time 8    Period Weeks    Status New    Target Date 04/25/21      PT LONG TERM GOAL #3   Title Pt to demo improved strength of bil hips to at least 4+/5 to improve stability, and ability for stairs, walking.    Time 8    Period Weeks    Status New    Target Date 04/25/21                   Plan - 04/18/21 1201     Clinical Impression Statement Pt continues to have pain in R hip region. She has appt tomorrow at sports med. Continued focus today for core and hip stability. Pt has improving hip strength, but does have difficulty with core stability and tendency for more anterior pelvic tilt. Pt referred to MD for further assessment of pain due to ongoing deficits.    Personal Factors and Comorbidities Time since onset of injury/illness/exacerbation    Examination-Activity Limitations Bed Mobility;Bend;Squat;Stairs;Locomotion Level;Transfers;Stand    Examination-Participation Restrictions Cleaning;Community Activity;Shop    Stability/Clinical Decision Making Stable/Uncomplicated    Rehab Potential Good    PT Frequency 2x / week    PT Duration 8 weeks    PT Treatment/Interventions ADLs/Self Care Home Management;Cryotherapy;Electrical Stimulation;Iontophoresis 4mg /ml Dexamethasone;Moist Heat;Traction;Ultrasound;DME Instruction;Gait training;Neuromuscular re-education;Balance training;Therapeutic exercise;Therapeutic activities;Functional mobility training;Stair training;Patient/family education;Orthotic Fit/Training;Manual techniques;Passive range of motion;Dry needling;Energy conservation;Spinal Manipulations;Joint Manipulations;Taping;Vasopneumatic Device    PT Home Exercise Plan [id number]     Consulted and Agree with Plan of Care Patient             Patient will benefit from skilled  therapeutic intervention in order to improve the following deficits and impairments:  Decreased range of motion, Increased muscle spasms, Decreased activity tolerance, Pain, Impaired flexibility, Decreased mobility, Decreased strength  Visit Diagnosis: Pain in right hip  Back pain, unspecified back location, unspecified back pain laterality, unspecified chronicity     Problem List Patient Active Problem List   Diagnosis Date Noted   Port-A-Cath in place 10/07/2019   Malignant neoplasm of upper-outer quadrant of left breast in female, estrogen receptor negative ([place]) 07/27/2019   Neuroretinitis of both eyes 11/14/2018   Papilledema    [name], PT, [name] 12:15 PM  04/18/21    [facility] [place], [place], [address] Phone: [phone]   Fax:  [phone]  Name: [name] MRN: [id number] Date of Birth: January 22, 1966

## 2021-04-19 ENCOUNTER — Other Ambulatory Visit: Payer: Self-pay

## 2021-04-19 ENCOUNTER — Ambulatory Visit (INDEPENDENT_AMBULATORY_CARE_PROVIDER_SITE_OTHER): Payer: BC Managed Care – PPO | Admitting: Sports Medicine

## 2021-04-19 VITALS — BP 122/80 | HR 97 | Ht 60.0 in | Wt 160.0 lb

## 2021-04-19 DIAGNOSIS — G8929 Other chronic pain: Secondary | ICD-10-CM | POA: Diagnosis not present

## 2021-04-19 DIAGNOSIS — M25551 Pain in right hip: Secondary | ICD-10-CM

## 2021-04-19 DIAGNOSIS — M9903 Segmental and somatic dysfunction of lumbar region: Secondary | ICD-10-CM | POA: Diagnosis not present

## 2021-04-19 DIAGNOSIS — M9905 Segmental and somatic dysfunction of pelvic region: Secondary | ICD-10-CM

## 2021-04-19 DIAGNOSIS — M7061 Trochanteric bursitis, right hip: Secondary | ICD-10-CM | POA: Diagnosis not present

## 2021-04-19 DIAGNOSIS — M545 Low back pain, unspecified: Secondary | ICD-10-CM | POA: Diagnosis not present

## 2021-04-19 DIAGNOSIS — M9904 Segmental and somatic dysfunction of sacral region: Secondary | ICD-10-CM

## 2021-04-19 NOTE — Patient Instructions (Addendum)
Good to see you  Continue Pt  Tylenol and NSAIDs as needed for pain control 3-4 week follow up

## 2021-04-20 ENCOUNTER — Encounter: Payer: Self-pay | Admitting: Physical Therapy

## 2021-04-20 ENCOUNTER — Ambulatory Visit (INDEPENDENT_AMBULATORY_CARE_PROVIDER_SITE_OTHER): Payer: BC Managed Care – PPO | Admitting: Physical Therapy

## 2021-04-20 DIAGNOSIS — M25551 Pain in right hip: Secondary | ICD-10-CM

## 2021-04-20 DIAGNOSIS — M549 Dorsalgia, unspecified: Secondary | ICD-10-CM | POA: Diagnosis not present

## 2021-04-20 NOTE — Therapy (Signed)
[facility] [address] [place], [place], [address] Phone: [phone]   Fax:  [phone]  Physical Therapy Treatment  Patient Details  Name: [name] MRN: [id number] Date of Birth: 1965/09/26 Referring Provider (PT): Inda Coke   Encounter Date: 04/20/2021   PT End of Session - 04/20/21 1223     Visit Number 10    Number of Visits 12    Date for PT Re-Evaluation 04/25/21    Authorization Type BCBS    PT Start Time 1103    PT Stop Time 1141    PT Time Calculation (min) 38 min    Activity Tolerance Patient tolerated treatment well    Behavior During Therapy [facility] for tasks assessed/performed             Past Medical History:  Diagnosis Date   Arthritis    Cancer ([place])    Left breast   Headache    migraine     Past Surgical History:  Procedure Laterality Date   ARTERY BIOPSY Right 11/11/2018   Procedure: BIOPSY TEMPORAL ARTERY;  Surgeon: Rosetta Posner, MD;  Location: [place];  Service: Vascular;  Laterality: Right;   BREAST LUMPECTOMY WITH RADIOACTIVE SEED AND SENTINEL LYMPH NODE BIOPSY Left 08/05/2019   Procedure: LEFT BREAST LUMPECTOMY WITH RADIOACTIVE SEED AND LEFT AXILLARY SENTINEL LYMPH NODE BIOPSY;  Surgeon: Rolm Bookbinder, MD;  Location: [place];  Service: General;  Laterality: Left;  [place]     x2   PORTACATH PLACEMENT Right 08/05/2019   Procedure: INSERTION PORT-A-CATH WITH ULTRASOUND GUIDANCE;  Surgeon: Rolm Bookbinder, MD;  Location: [place];  Service: General;  Laterality: Right;    There were no vitals filed for this visit.   Subjective Assessment - 04/20/21 1224     Subjective Pt saw MD yesterday, did have manipulation(s) and injection in R hip. Pt states mild soreness from injection today, no significant change in pain yet.    Currently in Pain? Yes    Pain Score 5     Pain Location Hip    Pain Orientation Right    Pain Descriptors / Indicators Aching;Sore    Pain  Type Acute pain    Pain Onset More than a month ago    Pain Frequency Intermittent    Pain Score 5    Pain Location Back    Pain Orientation Right    Pain Descriptors / Indicators Aching    Pain Type Chronic pain    Pain Onset More than a month ago    Pain Frequency Intermittent                               [facility] Adult PT Treatment/Exercise - 04/20/21 0001       Knee/Hip Exercises: Stretches   Piriformis Stretch 3 reps;30 seconds    Piriformis Stretch Limitations seated      Knee/Hip Exercises: Aerobic   Recumbent Bike L2 x 7  min;      Knee/Hip Exercises: Standing   Forward Step Up --   bilaterally   Other Standing Knee Exercises hip ext x 15 ea bil;    Other Standing Knee Exercises mini squats x 15;      Knee/Hip Exercises: Seated   Sit to Sand 15 reps   with education and practice for TA and core stability.     Knee/Hip Exercises: Supine   Bridges with Diona Foley  Squeeze 20 reps    Straight Leg Raises 15 reps;Both    Straight Leg Raises Limitations with TA    Other Supine Knee/Hip Exercises posterior pelvic tilts 5" holds x15      Knee/Hip Exercises: Sidelying   Hip ABduction 10 reps;2 sets;Both    Clams x15  bil      Knee/Hip Exercises: Prone   Other Prone Exercises --      Manual Therapy   Manual Therapy Joint mobilization;Passive ROM    Joint Mobilization --                     PT Education - 04/20/21 1225     Education Details Reviewed Final HEP, PT plan    Person(s) Educated Patient    Methods Explanation;Demonstration;Verbal cues;Handout    Comprehension Verbalized understanding;Returned demonstration;Verbal cues required              PT Short Term Goals - 04/20/21 1225       PT SHORT TERM GOAL #1   Title Pt to be independent with initial HEP    Time 2    Period Weeks    Status Achieved    Target Date 03/14/21               PT Long Term Goals - 04/20/21 1225       PT LONG TERM GOAL #1   Title Pt to  be independent wtih final HEP    Time 8    Period Weeks    Status New    Target Date 04/25/21      PT LONG TERM GOAL #2   Title Pt to report decreased pain in R hip, to 0-2/10 with activity    Time 8    Period Weeks    Status New    Target Date 04/25/21      PT LONG TERM GOAL #3   Title Pt to demo improved strength of bil hips to at least 4+/5 to improve stability, and ability for stairs, walking.    Time 8    Period Weeks    Status New    Target Date 04/25/21                   Plan - 04/20/21 1226     Clinical Impression Statement Continued focus for hip and core strength and stability today. Pt challenged with core stabilization with transitions from sit to stand, and squat/bend motions. Discussed continuting for 1 more visit next week, will continue to assess pain, pt will benefit from continued strengthening with HEP.    Personal Factors and Comorbidities Time since onset of injury/illness/exacerbation    Examination-Activity Limitations Bed Mobility;Bend;Squat;Stairs;Locomotion Level;Transfers;Stand    Examination-Participation Restrictions Cleaning;Community Activity;Shop    Stability/Clinical Decision Making Stable/Uncomplicated    Rehab Potential Good    PT Frequency 2x / week    PT Duration 8 weeks    PT Treatment/Interventions ADLs/Self Care Home Management;Cryotherapy;Electrical Stimulation;Iontophoresis 4mg /ml Dexamethasone;Moist Heat;Traction;Ultrasound;DME Instruction;Gait training;Neuromuscular re-education;Balance training;Therapeutic exercise;Therapeutic activities;Functional mobility training;Stair training;Patient/family education;Orthotic Fit/Training;Manual techniques;Passive range of motion;Dry needling;Energy conservation;Spinal Manipulations;Joint Manipulations;Taping;Vasopneumatic Device    PT Home Exercise Plan [id number]    Consulted and Agree with Plan of Care Patient             Patient will benefit from skilled therapeutic intervention in  order to improve the following deficits and impairments:  Decreased range of motion, Increased muscle spasms, Decreased activity tolerance, Pain, Impaired flexibility, Decreased mobility, Decreased strength  Visit Diagnosis: Back pain, unspecified back location, unspecified back pain laterality, unspecified chronicity  Pain in right hip     Problem List Patient Active Problem List   Diagnosis Date Noted   Port-A-Cath in place 10/07/2019   Malignant neoplasm of upper-outer quadrant of left breast in female, estrogen receptor negative ([place]) 07/27/2019   Neuroretinitis of both eyes 11/14/2018   Papilledema     [name], PT, [name] 12:31 PM  04/20/21   [place], [place], [address] Phone: [phone]   Fax:  [phone]  Name: NAYELLI INGLIS MRN: [id number] Date of Birth: November 04, 1965

## 2021-04-26 ENCOUNTER — Encounter: Payer: BC Managed Care – PPO | Admitting: Physical Therapy

## 2021-04-30 ENCOUNTER — Encounter: Payer: Self-pay | Admitting: Physical Therapy

## 2021-04-30 ENCOUNTER — Ambulatory Visit (INDEPENDENT_AMBULATORY_CARE_PROVIDER_SITE_OTHER): Payer: BC Managed Care – PPO | Admitting: Physical Therapy

## 2021-04-30 DIAGNOSIS — M549 Dorsalgia, unspecified: Secondary | ICD-10-CM

## 2021-04-30 DIAGNOSIS — M25551 Pain in right hip: Secondary | ICD-10-CM | POA: Diagnosis not present

## 2021-04-30 NOTE — Therapy (Addendum)
[facility] [address] [place], [place], [address] Phone: [phone]   Fax:  [phone]  Physical Therapy Treatment/Re-Cert   Patient Details  Name: [name] MRN: [id number] Date of Birth: [birth year] Referring Provider (PT): Inda Coke   Encounter Date: 04/30/2021   PT End of Session - 04/30/21 1604     Visit Number 11    Number of Visits 20    Date for PT Re-Evaluation 05/28/21    Authorization Type BCBS    Authorization Time Period Re-cert done at visit 11    PT Start Time 1558    PT Stop Time 1642    PT Time Calculation (min) 44 min    Activity Tolerance Patient tolerated treatment well    Behavior During Therapy [facility] for tasks assessed/performed             Past Medical History:  Diagnosis Date   Arthritis    Cancer ([place])    Left breast   Headache    migraine     Past Surgical History:  Procedure Laterality Date   ARTERY BIOPSY Right 11/11/2018   Procedure: BIOPSY TEMPORAL ARTERY;  Surgeon: Rosetta Posner, MD;  Location: [place];  Service: Vascular;  Laterality: Right;   BREAST LUMPECTOMY WITH RADIOACTIVE SEED AND SENTINEL LYMPH NODE BIOPSY Left 08/05/2019   Procedure: LEFT BREAST LUMPECTOMY WITH RADIOACTIVE SEED AND LEFT AXILLARY SENTINEL LYMPH NODE BIOPSY;  Surgeon: Rolm Bookbinder, MD;  Location: [place];  Service: General;  Laterality: Left;  [place]     x2   PORTACATH PLACEMENT Right 08/05/2019   Procedure: INSERTION PORT-A-CATH WITH ULTRASOUND GUIDANCE;  Surgeon: Rolm Bookbinder, MD;  Location: [place];  Service: General;  Laterality: Right;    There were no vitals filed for this visit.   Subjective Assessment - 04/30/21 1605     Subjective Pt states some improvement in hip since recent injection. Still feels painful, about 5/10.    Patient Stated Goals decreased pain in R hip.    Currently in Pain? Yes    Pain Score 5     Pain Location Hip    Pain Orientation Right     Pain Descriptors / Indicators Aching;Sore    Pain Type Acute pain    Pain Onset More than a month ago    Pain Frequency Intermittent                [facility] PT Assessment - 04/30/21 0001       Assessment   Medical Diagnosis R hip pain    Referring Provider (PT) Inda Coke    Prior Therapy no      Precautions   Precautions None      Balance Screen   Has the patient fallen in the past 6 months No      Prior Function   Level of Independence Independent      Cognition   Overall Cognitive Status Within Functional Limits for tasks assessed      AROM   Overall AROM Comments Hips: WNL      Strength   Overall Strength Comments Hips: 4+/5, Knee: 5/5      Palpation   Palpation comment Pain in R gr troch, into R glute, and R SI.                           [facility] Adult PT Treatment/Exercise - 04/30/21 0001  Knee/Hip Exercises: Stretches   Piriformis Stretch 3 reps;30 seconds    Piriformis Stretch Limitations supine      Knee/Hip Exercises: Aerobic   Recumbent Bike L2 x 6  min;      Knee/Hip Exercises: Standing   Other Standing Knee Exercises hip ext 2x10  bil; HIp abd 2x10 bil; side glides to L x 20;    Other Standing Knee Exercises Band walks BlTB x 25 ft x 4;      Knee/Hip Exercises: Seated   Sit to Sand 15 reps   with education and practice for TA and core stability.     Knee/Hip Exercises: Supine   Bridges with Ball Squeeze 20 reps    Straight Leg Raises --    Straight Leg Raises Limitations --    Other Supine Knee/Hip Exercises --      Knee/Hip Exercises: Sidelying   Hip ABduction --    Clams --      Knee/Hip Exercises: Prone   Other Prone Exercises prone press ups x 20;      Manual Therapy   Manual Therapy Joint mobilization;Passive ROM;Manual Traction    Manual therapy comments skilled palpation and monitoring of soft tissue wtih dry needling.    Joint Mobilization lumbar PA mobs    Soft tissue mobilization TPR to R lumbar  paraspinals.    Manual Traction long leg distraction for R lumbar pump x 82mn;              Trigger Point Dry Needling - 04/30/21 0001     Consent Given? Yes    Education Handout Provided Previously provided    Muscles Treated Back/Hip Lumbar multifidi    Lumbar multifidi Response Palpable increased muscle length   R, L2-4                    PT Short Term Goals - 04/20/21 1225       PT SHORT TERM GOAL #1   Title Pt to be independent with initial HEP    Time 2    Period Weeks    Status Achieved    Target Date 03/14/21               PT Long Term Goals - 04/20/21 1225       PT LONG TERM GOAL #1   Title Pt to be independent wtih final HEP    Time 8    Period Weeks    Status New    Target Date 04/25/21      PT LONG TERM GOAL #2   Title Pt to report decreased pain in R hip, to 0-2/10 with activity    Time 8    Period Weeks    Status New    Target Date 04/25/21      PT LONG TERM GOAL #3   Title Pt to demo improved strength of bil hips to at least 4+/5 to improve stability, and ability for stairs, walking.    Time 8    Period Weeks    Status New    Target Date 04/25/21                   Plan - 04/30/21 1645     Clinical Impression Statement Pt has been seen for 11 visits. She is doing very well wtih ther ex and strengthening. She is still having pain into R glute, minimal change in pain since start of care. She has tenderness in R gr troch,  and also in R side of lumbar spine. She has Increased pain with SLS on R, and loading of R side with ther ex today. Trial for lumbar extension and side glides today, given for HEP as well. Will f/u with pt next week to see any change in pain in back/hip if pain is more radicular vs bursitis in nature. Pt to benefit from continued care, for ongoing back and hip pain. If pt has minimal changes in pain with more focus on back, she will be referred back to MD for further assessment. Pt in agreement with plan.     Personal Factors and Comorbidities Time since onset of injury/illness/exacerbation    Examination-Activity Limitations Bed Mobility;Bend;Squat;Stairs;Locomotion Level;Transfers;Stand    Examination-Participation Restrictions Cleaning;Community Activity;Shop    Stability/Clinical Decision Making Stable/Uncomplicated    Rehab Potential Good    PT Frequency 2x / week    PT Duration 8 weeks    PT Treatment/Interventions ADLs/Self Care Home Management;Cryotherapy;Electrical Stimulation;Iontophoresis 13m/ml Dexamethasone;Moist Heat;Traction;Ultrasound;DME Instruction;Gait training;Neuromuscular re-education;Balance training;Therapeutic exercise;Therapeutic activities;Functional mobility training;Stair training;Patient/family education;Orthotic Fit/Training;Manual techniques;Passive range of motion;Dry needling;Energy conservation;Spinal Manipulations;Joint Manipulations;Taping;Vasopneumatic Device    PT Home Exercise Plan [id number]    Consulted and Agree with Plan of Care Patient             Patient will benefit from skilled therapeutic intervention in order to improve the following deficits and impairments:  Decreased range of motion, Increased muscle spasms, Decreased activity tolerance, Pain, Impaired flexibility, Decreased mobility, Decreased strength  Visit Diagnosis: Back pain, unspecified back location, unspecified back pain laterality, unspecified chronicity  Pain in right hip     Problem List Patient Active Problem List   Diagnosis Date Noted   Port-A-Cath in place 10/07/2019   Malignant neoplasm of upper-outer quadrant of left breast in female, estrogen receptor negative [place] 07/27/2019   Neuroretinitis of both eyes 11/14/2018   Papilledema    LLyndee Hensen PT, [name] 4:51 PM  04/30/21    [place] [address]Phone: [phone]  Fax:  [phone] Name: [name]: [id number]  of Birth: 108-16-1967 PHYSICAL THERAPY DISCHARGE SUMMARY  Visits from Start of Care: 11 Plan: Patient agrees to discharge.  Patient goals were partially met. Patient is being discharged due to - having MRI

## 2021-05-03 ENCOUNTER — Encounter: Payer: BC Managed Care – PPO | Admitting: Physical Therapy

## 2021-05-10 ENCOUNTER — Ambulatory Visit: Payer: BC Managed Care – PPO | Admitting: Sports Medicine

## 2021-05-15 NOTE — Progress Notes (Signed)
[name] D.[name] [place] Phone: [phone]   Assessment and Plan:     1. Chronic bilateral low back pain without sciatica 2. Right hip pain -Chronic with exacerbation, subsequent sports medicine visit - Patient received minimal to no relief after CSI greater trochanter at previous office visit and continued PT - Unclear etiology of posterior right hip pain, however differential includes intra-articular right hip pathology, sacroiliitis, lower lumbar radiculopathy - Due to failure to improve with >6 weeks of conservative therapy including PT, CSI, NSAIDs, we will proceed with MRI pelvis without contrast for further evaluation - Start meloxicam 15 mg daily x2 weeks and may use remainder as needed for pain control.  Instructed not to use additional NSAIDs while taking meloxicam.  May use Tylenol for breakthrough pain  -Patient feels that she is adequately able to perform physical therapy exercises at home.  Okay to discontinue PT sessions  Pertinent previous records reviewed include none   Follow Up: After MRI discuss treatment plan results   Subjective:   I, [name], am serving as a Education administrator for [name]  Chief Complaint: low back and right hip pain   HPI:  04/19/2021 Patient is a 56 year old female complaining of right sided hip and back pain. Patient states that she has had about 9 sessions of PT and hasn't helped, first started hurting for 6-8 months and the pain is so bad that she cant sleep, no MOI does state that she has had back pain for years. Localized to the hip, if she sits for a long time and then gets up that hurts the most trying to get comfortable when going to bed. Sometimes take ib or tylenol mostly when she cant sleep    05/16/2021 Patient states that she okay still has pain on the hip and the low back  on the right side , hasn't gotten any better since the last time she  was here    Relevant Historical Information: History of breast cancer  Additional pertinent review of systems negative.   Current Outpatient Medications:    atorvastatin (LIPITOR) 20 MG tablet, Take 1 tablet (20 mg total) by mouth daily., Disp: 90 tablet, Rfl: 1   ibuprofen (ADVIL) 200 MG tablet, Take 400-600 mg by mouth every 8 (eight) hours as needed for moderate pain., Disp: , Rfl:    meloxicam (MOBIC) 15 MG tablet, Take 1 tablet (15 mg total) by mouth daily., Disp: 30 tablet, Rfl: 0   Multiple Vitamin (MULTIVITAMIN WITH MINERALS) TABS tablet, Take 1 tablet by mouth daily. One-A-Day for Women 50+, Disp: , Rfl:    Objective:     Vitals:   05/16/21 1509  BP: 118/76  Pulse: 78  SpO2: 99%  Weight: 164 lb (74.4 kg)  Height: 5' (1.524 m)      Body mass index is 32.03 kg/m.    Physical Exam:    General: awake, alert, and oriented no acute distress, nontoxic Skin: no suspicious lesions or rashes Neuro:sensation intact distally with no dificits, normal muscle tone, no atrophy, strength 5/5 in all tested lower ext groups Psych: normal mood and affect, speech clear  Right hip: No deformity, swelling or wasting ROM Fexion 90, ext 30, IR 45, ER 45 TTP SI joint, lumbar spine, greater trochanter, gluteal musculature NTTP over the hip flexors  Negative log roll with FROM Negative FABER Negative FADIR Negative Piriformis test Negative trendelenberg Gait normal  Electronically signed by:  [name] D.[name] 3:39 PM 05/16/21

## 2021-05-16 ENCOUNTER — Other Ambulatory Visit: Payer: Self-pay

## 2021-05-16 ENCOUNTER — Ambulatory Visit (INDEPENDENT_AMBULATORY_CARE_PROVIDER_SITE_OTHER): Payer: BC Managed Care – PPO | Admitting: Sports Medicine

## 2021-05-16 VITALS — BP 118/76 | HR 78 | Ht 60.0 in | Wt 164.0 lb

## 2021-05-16 DIAGNOSIS — M25551 Pain in right hip: Secondary | ICD-10-CM | POA: Diagnosis not present

## 2021-05-16 DIAGNOSIS — M545 Low back pain, unspecified: Secondary | ICD-10-CM

## 2021-05-16 DIAGNOSIS — G8929 Other chronic pain: Secondary | ICD-10-CM

## 2021-05-16 MED ORDER — MELOXICAM 15 MG PO TABS: 15.0000 mg | ORAL_TABLET | Freq: Every day | ORAL | 0 refills | Status: DC

## 2021-05-16 NOTE — Patient Instructions (Addendum)
Good to see you  Meloxicam 15 mg daily for 2 weeks if still in pain complete a 3rd week use remainder as needed MRI referral Follow up 3 days after MRI to discuss results

## 2021-06-06 ENCOUNTER — Ambulatory Visit
Admission: RE | Admit: 2021-06-06 | Discharge: 2021-06-06 | Disposition: A | Discharge status: Home / Self Care | Payer: BC Managed Care – PPO | Source: Ambulatory Visit | Attending: Sports Medicine | Admitting: Sports Medicine

## 2021-06-06 ENCOUNTER — Other Ambulatory Visit: Payer: Self-pay

## 2021-06-06 DIAGNOSIS — M25551 Pain in right hip: Secondary | ICD-10-CM

## 2021-06-06 DIAGNOSIS — M545 Low back pain, unspecified: Secondary | ICD-10-CM

## 2021-06-06 DIAGNOSIS — G8929 Other chronic pain: Secondary | ICD-10-CM

## 2021-06-13 ENCOUNTER — Other Ambulatory Visit: Payer: Self-pay | Admitting: Sports Medicine

## 2021-06-19 NOTE — Progress Notes (Signed)
? ? [name] D.Merril Abbe ?[facility] ?[place] ?Phone: [phone] ?  ?Assessment and Plan:   ?  ?1. Right hip pain ?2. Sacroiliitis ([place]) ?-Chronic with exacerbation, subsequent sports medicine visit ?- Unclear etiology of posterior right hip pain, with most likely diagnosis based on HPI and physical exam being sacroiliitis first right SI joint dysfunction.  Differential also includes intra-articular hip pathology ?- Reviewed MRI pelvis with patient which was largely unremarkable.  It did show mild disc bulging at L5-S1, though this does not coincide with patient's area of discomfort ?- Patient elected for SI joint injection.  Tolerated well per note below ?- Discontinue daily meloxicam use and may use Tylenol as needed for pain ?- Continue HEP ? ?Procedure: Ultrasound Guided Sacroiliac Joint Injection  ?Side: Right ?Diagnosis: SI joint pain ?[place] Indication:  ?- accuracy is paramount for diagnosis ?- to ensure therapeutic efficacy or procedural success ?- to reduce procedural risk ? ?After explaining the procedure, viable alternatives, risks, and answering any questions, consent was given verbally. The site was cleaned with chlorhexidine prep. An ultrasound transducer was placed on the lumbosacral spine.  The spinous processes were identified. These were followed down from the lumbar spine to the scarum and the SI joint was identified as were the neural foramen.  A needle was introduced with care taken to avoid the neural foramen under ultrasound guidance into the SI joint with sterile technique.    A steroid injection was performed using 4ml of 1% lidocaine without epinephrine and 40 mg of triamcinolone (KENALOG) 40mg /ml. This was well tolerated and resulted in  relief.  Needle was removed and dressing placed and post injection instructions were given including  a discussion of likely return of pain today after the anesthetic wears off (with the possibility  of worsened pain) until the steroid starts to work in 1-3 days.   Pt was advised to call or return to clinic if these symptoms worsen or fail to improve as anticipated. ? ? ? ?Pertinent previous records reviewed include none ?  ?Follow Up: 3 weeks for reevaluation.  If no improvement or worsening of symptoms, could consider intra-articular hip CSI ?  ?Subjective:   ?I, [name], am serving as a Education administrator for [employer] ? ?Chief Complaint: low back pain MRI follow up  ? ?HPI:  ?04/19/2021 ?Patient is a 56 year old female complaining of right sided hip and back pain. Patient states that she has had about 9 sessions of PT and hasn't helped, first started hurting for 6-8 months and the pain is so bad that she cant sleep, no MOI does state that she has had back pain for years. Localized to the hip, if she sits for a long time and then gets up that hurts the most trying to get comfortable when going to bed. Sometimes take ib or tylenol mostly when she cant sleep  ?  ?05/16/2021 ?Patient states that she okay still has pain on the hip and the low back  on the right side , hasn't gotten any better since the last time she was here  ?  ?06/20/2021 ?Patient states that she is still doing the same  ? ? ?  ?Relevant Historical Information: History of breast cancer ? ?Additional pertinent review of systems negative. ? ? ?Current Outpatient Medications:  ?  atorvastatin (LIPITOR) 20 MG tablet, Take 1 tablet (20 mg total) by mouth daily., Disp: 90 tablet, Rfl: 1 ?  ibuprofen (ADVIL)  200 MG tablet, Take 400-600 mg by mouth every 8 (eight) hours as needed for moderate pain., Disp: , Rfl:  ?  meloxicam (MOBIC) 15 MG tablet, Take 1 tablet (15 mg total) by mouth daily., Disp: 30 tablet, Rfl: 0 ?  Multiple Vitamin (MULTIVITAMIN WITH MINERALS) TABS tablet, Take 1 tablet by mouth daily. One-A-Day for Women 50+, Disp: , Rfl:   ? ?Objective:   ?  ?Vitals:  ? 06/20/21 1525  ?BP: 120/80  ?Pulse: 91  ?SpO2: 99%  ?Weight: 164 lb  (74.4 kg)  ?Height: 5' (1.524 m)  ?  ?  ?Body mass index is 32.03 kg/m?.  ?  ?Physical Exam:   ? ?General: awake, alert, and oriented no acute distress, nontoxic ?Skin: no suspicious lesions or rashes ?Neuro:sensation intact distally with no dificits, normal muscle tone, no atrophy, strength 5/5 in all tested lower ext groups ?Psych: normal mood and affect, speech clear ?  ?Right hip: ?No deformity, swelling or wasting ?ROM Fexion 90, ext 30, IR 45, ER 45 ?TTP SI joint, greater trochanter, gluteal musculature  ?NTTP over the hip flexors ,lumbar spine,  ?Negative log roll with FROM ?Negative FABER ?Negative FADIR ?Negative Piriformis test ?  ?Gait normal  ? ? ?Electronically signed by:  ?[name] D.Merril Abbe ?[facility] ?5:27 PM 06/20/21 ?

## 2021-06-20 ENCOUNTER — Ambulatory Visit: Payer: Self-pay

## 2021-06-20 ENCOUNTER — Other Ambulatory Visit: Payer: Self-pay

## 2021-06-20 ENCOUNTER — Ambulatory Visit: Payer: BC Managed Care – PPO | Admitting: Sports Medicine

## 2021-06-20 VITALS — BP 120/80 | HR 91 | Ht 60.0 in | Wt 164.0 lb

## 2021-06-20 DIAGNOSIS — M461 Sacroiliitis, not elsewhere classified: Secondary | ICD-10-CM

## 2021-06-20 DIAGNOSIS — M25551 Pain in right hip: Secondary | ICD-10-CM | POA: Diagnosis not present

## 2021-06-20 NOTE — Patient Instructions (Addendum)
Good to see you  ?Tylenol [phone] mg 2-3 times a day for pain relief  ?3 week follow up  ?

## 2021-07-31 ENCOUNTER — Other Ambulatory Visit: Payer: Self-pay | Admitting: Hematology and Oncology

## 2021-07-31 DIAGNOSIS — Z9889 Other specified postprocedural states: Secondary | ICD-10-CM

## 2021-08-03 ENCOUNTER — Telehealth: Payer: Self-pay | Admitting: Emergency Medicine

## 2021-08-03 NOTE — Telephone Encounter (Signed)
S1714 - A Prospective Observational Cohort Study to Develop a Predictive Model of Taxane-Induced Peripheral Neuropathy in Cancer Patients ? ?08/03/21 ? ?Called to complete the week 104 assessments for this study.  Patient states she has not developed any neuropathy symptoms since her last visit.  She denies symptoms of numbness, tingling, or burning or shooting pain.  Completed the PROs: FACT/GOG-NTX-4, PRO-CTCAE, and EORTC [id number] verbally over the phone. ? ?The patient denies questions or concerns at this time.  She was informed she would receive a call in approximately one year to complete the week 156 assessments.  She was thanked for her time and continued participation. ? ?Clabe Seal ?Clinical Research Coordinator I  ?08/03/21  11:16 AM ?

## 2021-08-21 ENCOUNTER — Other Ambulatory Visit: Payer: Self-pay | Admitting: Physician Assistant

## 2021-08-28 ENCOUNTER — Telehealth (INDEPENDENT_AMBULATORY_CARE_PROVIDER_SITE_OTHER): Payer: BC Managed Care – PPO | Admitting: Physician Assistant

## 2021-08-28 VITALS — Temp 100.1°F | Ht 60.0 in | Wt 164.0 lb

## 2021-08-28 DIAGNOSIS — J029 Acute pharyngitis, unspecified: Secondary | ICD-10-CM

## 2021-08-28 MED ORDER — AMOXICILLIN 500 MG PO CAPS: 1000.0000 mg | ORAL_CAPSULE | Freq: Two times a day (BID) | ORAL | 0 refills | Status: AC

## 2021-08-28 NOTE — Progress Notes (Signed)
? ?  Virtual Visit via Video Note ? ?I connected with  [name]  on 08/28/21 at 11:45 AM EDT by a video enabled telemedicine application and verified that I am speaking with the correct person using two identifiers. ? ?Location: ?Patient: home ?Provider: Therapist, music at [employer] ?Persons present: Patient and myself ?  ?I discussed the limitations of evaluation and management by telemedicine and the availability of in person appointments. The patient expressed understanding and agreed to proceed.  ? ?History of Present Illness: ? 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  ? ?Observations/Objective: ? ?Gen: Awake, alert, no acute distress, general malaise  ?Resp: Breathing is even and non-labored ?Psych: calm/pleasant demeanor ?Neuro: Alert and Oriented x 3, + facial symmetry, speech is clear. ? ? ?Assessment and Plan: ? ?1. Acute pharyngitis, unspecified etiology ?Strong suspicion for streptococcal pharyngitis.  I did ask the patient to do a home COVID test and respond with results through MyChart in the next 24 hours.  At this time, plan to treat for streptococcal pharyngitis given recent exposure and clinical symptoms.  Treat with amoxicillin 1000 mg twice daily x10 days.  Throat lozenges, Tylenol or ibuprofen as needed. ? ?Follow-up in office if worse or no improvement. ? ? ?Follow Up Instructions: ? ?  ?I discussed the assessment and treatment plan with the patient. The patient was provided an opportunity to ask questions and all were answered. The patient agreed with the plan and demonstrated an understanding of the instructions. ?  ?The patient was advised to call back or  seek an in-person evaluation if the symptoms worsen or if the condition fails to improve as anticipated. ? ?Krishon Adkison M Kyston Gonce, PA-C ? ?

## 2021-08-28 NOTE — Patient Instructions (Signed)
Please message back with COVID-19 home test results  ?

## 2021-08-29 ENCOUNTER — Telehealth: Payer: Self-pay | Admitting: Physician Assistant

## 2021-08-29 NOTE — Telephone Encounter (Signed)
FYI

## 2021-08-29 NOTE — Telephone Encounter (Signed)
Pt states she took a home Covid test and it came up negative. She was told to inform [facility] of the result. ?

## 2021-08-29 NOTE — Telephone Encounter (Signed)
Noted, thank you

## 2021-08-31 ENCOUNTER — Ambulatory Visit
Admission: RE | Admit: 2021-08-31 | Discharge: 2021-08-31 | Disposition: A | Discharge status: Home / Self Care | Payer: BC Managed Care – PPO | Source: Ambulatory Visit | Attending: Hematology and Oncology | Admitting: Hematology and Oncology

## 2021-08-31 DIAGNOSIS — Z9889 Other specified postprocedural states: Secondary | ICD-10-CM

## 2021-10-04 NOTE — Progress Notes (Signed)
Patient Care Team: Inda Coke, [place] as PCP - General (Physician Assistant) Mauro Kaufmann, RN as Oncology Nurse Navigator Rockwell Germany, RN as Oncology Nurse Navigator Rolm Bookbinder, MD as Consulting Physician (General Surgery) Nicholas Lose, MD as Consulting Physician (Hematology and Oncology) Kyung Rudd, MD as Consulting Physician (Radiation Oncology)  DIAGNOSIS:  Encounter Diagnosis  Name Primary?   Malignant neoplasm of upper-outer quadrant of left breast in female, estrogen receptor negative ([place])     SUMMARY OF ONCOLOGIC HISTORY: Oncology History  Malignant neoplasm of upper-outer quadrant of left breast in female, estrogen receptor negative ([place])  07/27/2019 Initial Diagnosis   Screening mammogram showed a left breast asymmetry. Diagnostic mammogram and US showed a 1.2cm left breast mass, 12:30 position, no left axillary adenopathy. 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   08/05/2019 Surgery   Left lumpectomy: Grade 3 IDC, 2 cm with high-grade DCIS, lymphovascular invasion was identified, 1 lymph node negative, margins negative, ER 0%, PR 0%, HER-2 positive, Ki-67 90%   08/05/2019 Cancer Staging   Staging form: Breast, AJCC 8th Edition - Pathologic stage from 08/05/2019: Stage IIA (pT2, pN0, cM0, G3, ER-, PR-, HER2+) - Signed by Gardenia Phlegm, NP on 08/18/2019   08/30/2019 - 10/18/2020 Chemotherapy   Patient is on Treatment Plan : BREAST weekly PACLitaxel / trastuzumab / Maintenance trastuzumab every 21 days       CHIEF COMPLIANT: Follow-up on [facility] breast cancer  INTERVAL HISTORY: [name] is a  56 y.o. with above-mentioned history of left breast cancer who underwent a lumpectomy, adjuvant chemotherapy, and is currently on adjuvant Herceptin maintenance. She presents to the clinic today for a follow-up. States that she do feel tired but nothing unusual. States that it doesn't affect her work life. Denies pain  and discomfort in breast. States that she had been having back and hip pain but had seen a physical therapy for it. Overall she is doing good.  She does complain of carpal tunnel symptoms.  ALLERGIES:  is allergic to tape.  MEDICATIONS:  Current Outpatient Medications  Medication Sig Dispense Refill   atorvastatin (LIPITOR) 20 MG tablet TAKE 1 TABLET(20 MG) BY MOUTH DAILY 90 tablet 0   ibuprofen (ADVIL) 200 MG tablet Take 400-600 mg by mouth every 8 (eight) hours as needed for moderate pain.     Multiple Vitamin (MULTIVITAMIN WITH MINERALS) TABS tablet Take 1 tablet by mouth daily. One-A-Day for Women 50+     No current facility-administered medications for this visit.    PHYSICAL EXAMINATION: ECOG PERFORMANCE STATUS: 1 - Symptomatic but completely ambulatory  Vitals:   10/18/21 1057  BP: 132/72  Pulse: 93  Resp: 18  Temp: (!) 97.3 F (36.3 C)  SpO2: 98%   Filed Weights   10/18/21 1057  Weight: 165 lb 4.8 oz (75 kg)    BREAST: No palpable masses or nodules in either right or left breasts. No palpable axillary supraclavicular or infraclavicular adenopathy no breast tenderness or nipple discharge. (exam performed in the presence of a chaperone)  LABORATORY DATA:  I have reviewed the data as listed    Latest Ref Rng & Units 06/22/2020    8:39 AM 05/04/2020    1:58 PM 03/23/2020    3:02 PM  CMP  Glucose 70 - 99 mg/dL 92  98  94   BUN 6 - 20 mg/dL 21  19  23    Creatinine 0.44 - 1.00 mg/dL 0.83  0.86  0.81   Sodium 135 - 145 mmol/L 138  141  140   Potassium 3.5 - 5.1 mmol/L 4.0  4.1  4.0   Chloride 98 - 111 mmol/L 106  108  107   CO2 22 - 32 mmol/L 24  23  23    Calcium 8.9 - 10.3 mg/dL 8.7  9.2  8.9   Total Protein 6.5 - 8.1 g/dL 7.0  7.4  6.9   Total Bilirubin 0.3 - 1.2 mg/dL 0.4  0.4  <0.2   Alkaline Phos 38 - 126 U/L 88  94  91   AST 15 - 41 U/L 18  20  22    ALT 0 - 44 U/L 20  22  19      Lab Results  Component Value Date   WBC 4.5 06/22/2020   HGB 14.5 06/22/2020    HCT 42.4 06/22/2020   MCV 94.0 06/22/2020   PLT 230 06/22/2020   NEUTROABS 2.4 06/22/2020    ASSESSMENT & PLAN:  Malignant neoplasm of upper-outer quadrant of left breast in female, estrogen receptor negative ([place]) 09/2019:Screening mammogram showed a left breast asymmetry. Diagnostic mammogram and US showed a 1.2cm left breast mass, 12:30 position, no left axillary adenopathy. 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 T1c N0 stage Ia clinical stage   Recommendation: 1.  08/05/2019:Left lumpectomy: Grade 3 IDC, 2 cm with high-grade DCIS, lymphovascular invasion was identified, 1 lymph node negative, margins negative, ER 0%, PR 0%, HER-2 positive, Ki-67 90% 2.  Adjuvant chemotherapy with Taxol Herceptin followed by Herceptin maintenance for 1 year started 08/30/2019 completed 11/18/2019 3.  Adjuvant radiation therapy 01/11/2020-02/04/2020 Patient is participating in [place] neuropathy clinical trial  ------------------------------------------------------------------------------------------------------------------------------------------------------  Surveillance:  08/31/2021: Mammogram:  Benign breast density category B 10/18/2021: Breast exam: Return to clinic in 1 year for follow-up    No orders of the defined types were placed in this encounter.  The patient has a good understanding of the overall plan. she agrees with it. she will call with any problems that may develop before the next visit here. Total time spent: 30 mins including face to face time and time spent for planning, charting and co-ordination of care   Harriette Ohara, MD 10/18/21    I Gardiner Coins am scribing for Dr. Lindi Adie  I have reviewed the above documentation for accuracy and completeness, and I agree with the above.

## 2021-10-13 ENCOUNTER — Other Ambulatory Visit: Payer: Self-pay | Admitting: Nurse Practitioner

## 2021-10-17 NOTE — Assessment & Plan Note (Signed)
09/2019:Screening mammogram showed a left breast asymmetry. Diagnostic mammogram and US showed a 1.2cm left breast mass, 12:30 position, no left axillary adenopathy. 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 T1c N0 stage Ia clinical stage  Recommendation: 1.08/05/2019:Left lumpectomy: Grade 3 IDC, 2 cm with high-grade DCIS, lymphovascular invasion was identified, 1 lymph node negative, margins negative, ER 0%, PR 0%, HER-2 positive, Ki-67 90% 2.Adjuvant chemotherapy with Taxol Herceptin followed by Herceptin maintenance for 1 yearstarted 5/10/2021completed 11/18/2019 3.Adjuvant radiation therapy9/21/2021-02/04/2020 Patient is participating in[facility]neuropathy clinical trial ------------------------------------------------------------------------------------------------------------------------------------------------------   Surveillance: 08/31/2021: Mammogram:  Benign breast density category B 10/18/2021: Breast exam: Return to clinic in 1 year for follow-up

## 2021-10-18 ENCOUNTER — Inpatient Hospital Stay: Payer: BC Managed Care – PPO | Attending: Hematology and Oncology | Admitting: Hematology and Oncology

## 2021-10-18 ENCOUNTER — Other Ambulatory Visit: Payer: Self-pay

## 2021-10-18 DIAGNOSIS — Z79899 Other long term (current) drug therapy: Secondary | ICD-10-CM | POA: Diagnosis not present

## 2021-10-18 DIAGNOSIS — Z171 Estrogen receptor negative status [ER-]: Secondary | ICD-10-CM | POA: Diagnosis not present

## 2021-10-18 DIAGNOSIS — C50412 Malignant neoplasm of upper-outer quadrant of left female breast: Secondary | ICD-10-CM | POA: Diagnosis not present

## 2021-10-18 DIAGNOSIS — Z923 Personal history of irradiation: Secondary | ICD-10-CM | POA: Insufficient documentation

## 2021-10-19 ENCOUNTER — Telehealth: Payer: Self-pay | Admitting: Hematology and Oncology

## 2021-10-19 NOTE — Telephone Encounter (Signed)
Scheduled appointment per 6/29 los. Patient is aware.

## 2021-11-23 ENCOUNTER — Other Ambulatory Visit: Payer: Self-pay | Admitting: *Deleted

## 2021-11-23 MED ORDER — ATORVASTATIN CALCIUM 20 MG PO TABS: ORAL_TABLET | ORAL | 0 refills | Status: DC

## 2022-01-14 ENCOUNTER — Encounter: Payer: Self-pay | Admitting: *Deleted

## 2022-02-10 IMAGING — US US PLC BREAST LOC DEV 1ST LESION INC US GUIDE*L*
1 series · 2 of 2 positions shown · non-contrast
Comparison: Previous exam(s).

CLINICAL DATA: 53-year-old female presenting for radioactive seed
localization of the left breast prior to lumpectomy.

EXAM:
ULTRASOUND GUIDED RADIOACTIVE SEED LOCALIZATION OF THE LEFT BREAST

[Series 1: us plc breast loc dev 1st lesion inc us guide*left · 0.06mm/px · 2 of 2 slices shown]
[im 1/2]
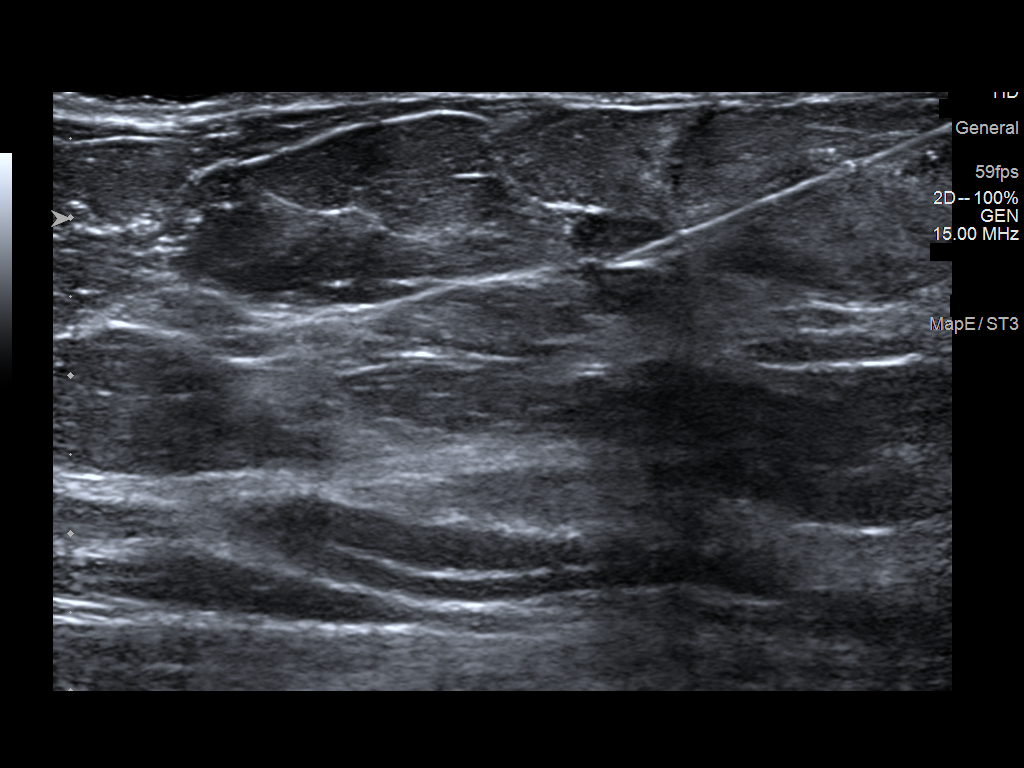
[im 2/2]
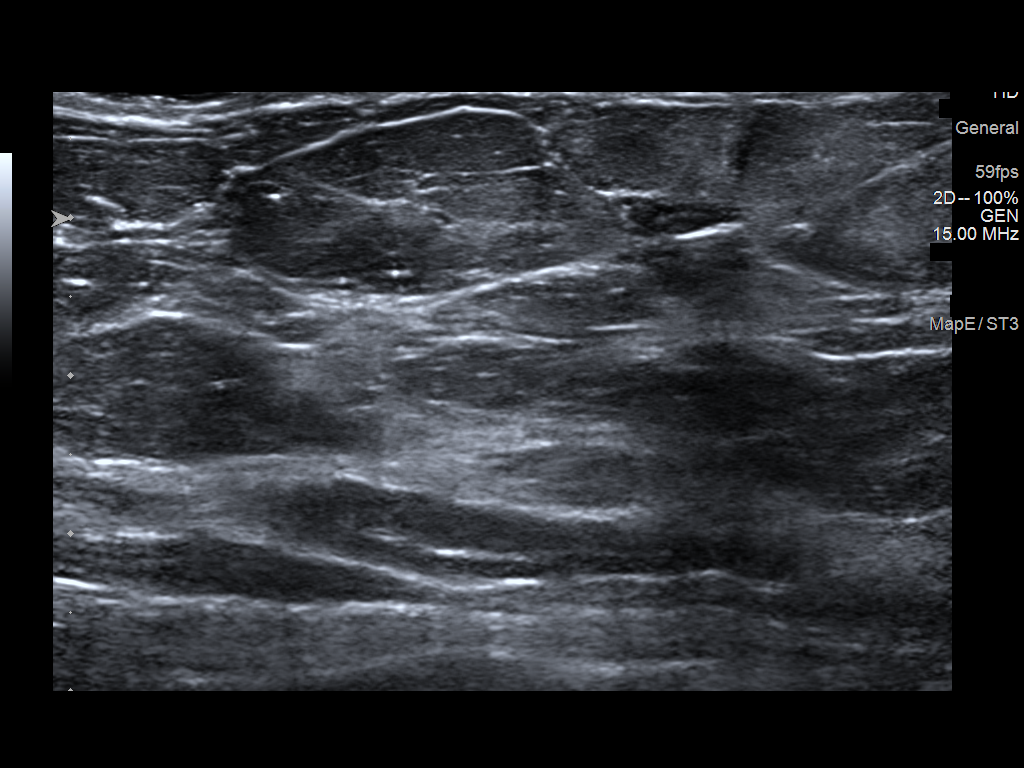

[2 of 2 positions shown; findings below may reference images not displayed]

FINDINGS: Patient presents for radioactive seed localization prior to left
breast lumpectomy. I met with the patient and we discussed the
procedure of seed localization including benefits and alternatives.
We discussed the high likelihood of a successful procedure. We
discussed the risks of the procedure including infection, bleeding,
tissue injury and further surgery. We discussed the low dose of
radioactivity involved in the procedure. Informed, written consent
was given.

The usual time-out protocol was performed immediately prior to the
procedure.

Using ultrasound guidance, sterile technique, 1% lidocaine and an
V-FD3 radioactive seed, the mass with the biopsy marking clip in the
left breast at [DATE] was localized using a lateral approach. The
follow-up mammogram images confirm the seed in the expected location
and were marked for Dr. Tiger.

Follow-up survey of the patient confirms presence of the radioactive
seed.

Order number of V-FD3 seed:  [id number].

Total activity:  0.246 millicuries reference Date: 07/29/2019

The patient tolerated the procedure well and was released from the
[REDACTED]. She was given instructions regarding seed removal.
IMPRESSION: Radioactive seed localization left breast. No apparent
complications.

## 2022-02-10 IMAGING — DX DG CHEST 1V PORT
1 series · 1 of 1 positions shown · non-contrast
Comparison: November 12, 2018

CLINICAL DATA: Port-A-Cath placement

EXAM:
PORTABLE CHEST 1 VIEW

[chest]
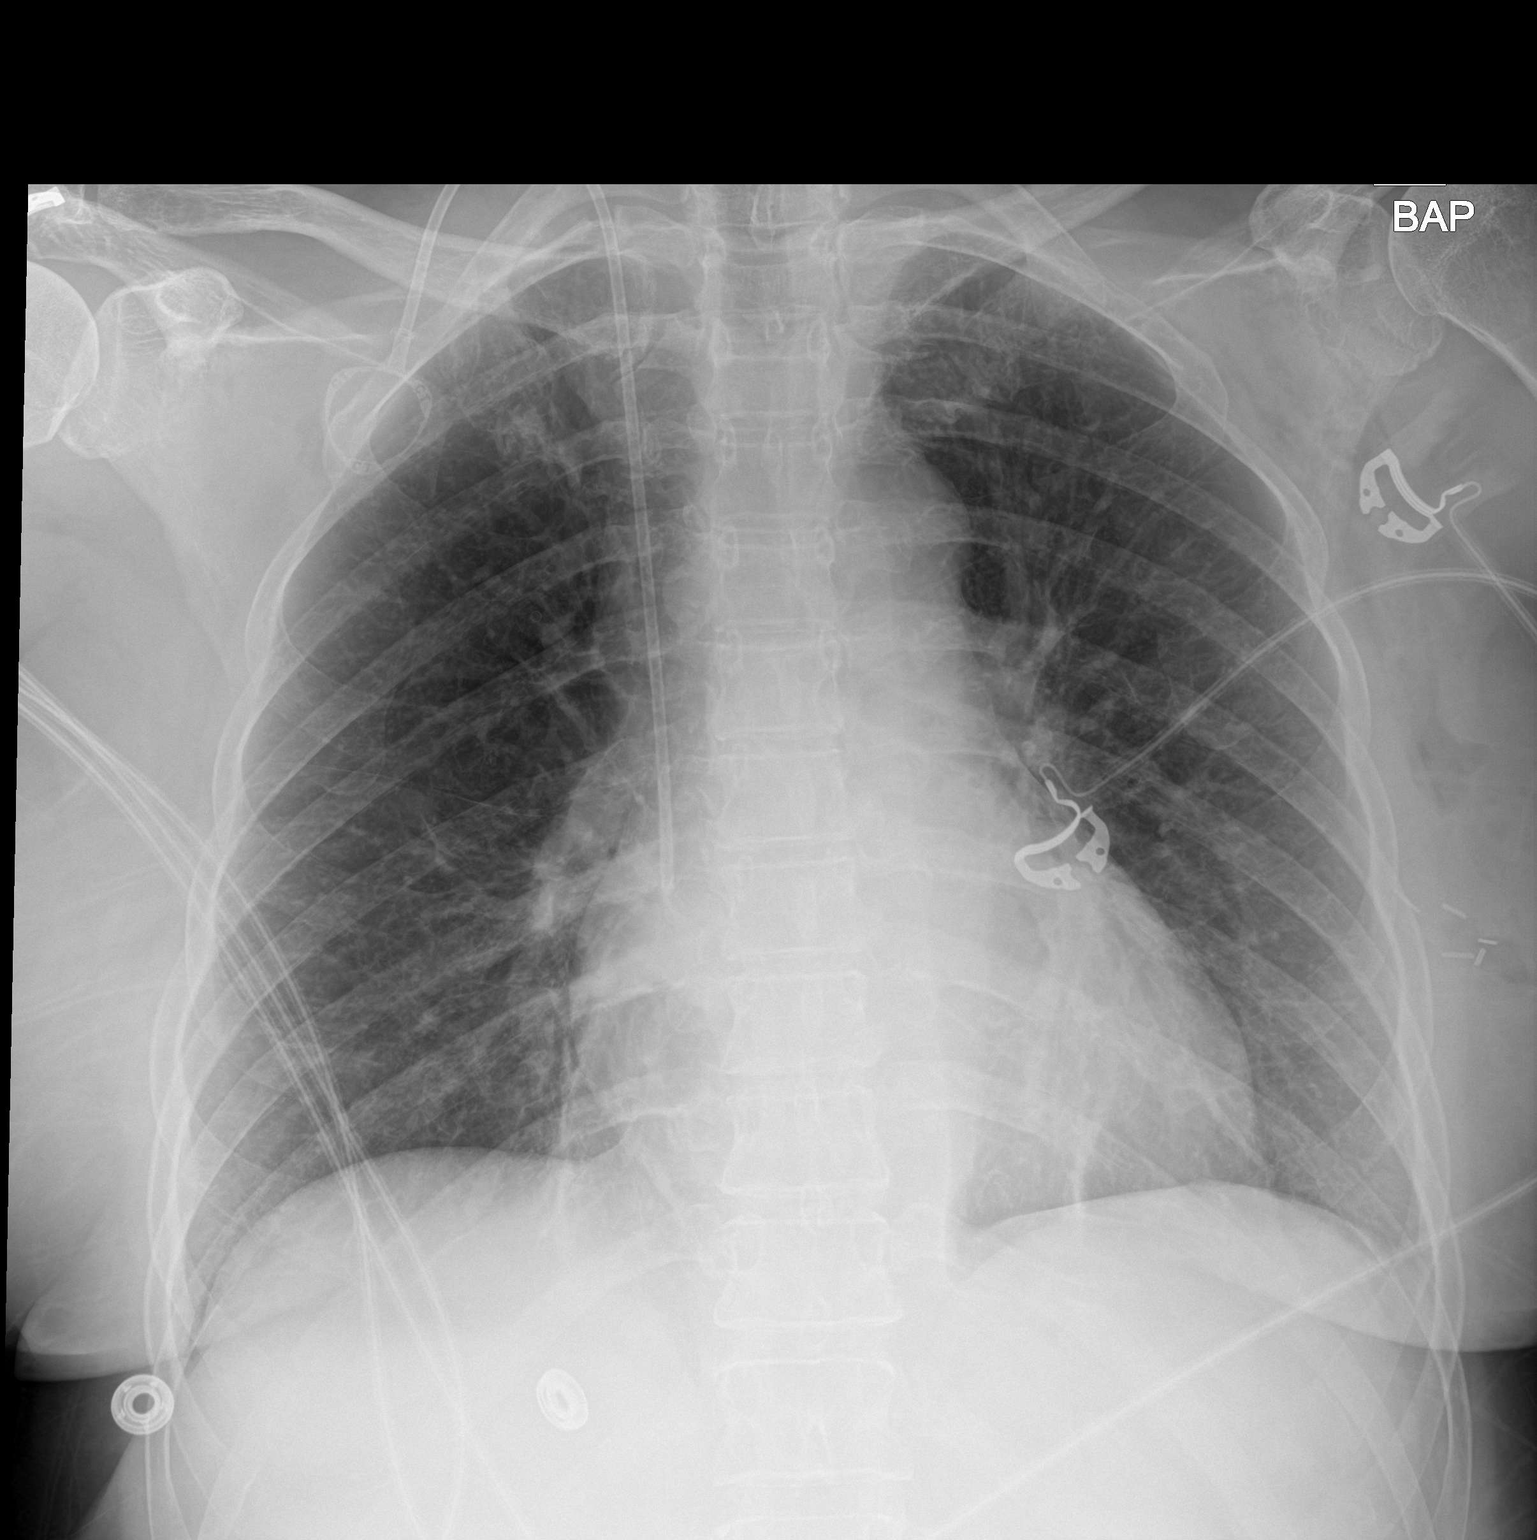

[1 of 1 positions shown; findings below may reference images not displayed]

FINDINGS: Port-A-Cath tip is in the superior vena cava near the cavoatrial
junction. No pneumothorax. Lungs are clear. Heart is upper normal in
size with pulmonary vascularity normal. No adenopathy. No bone
lesions.
IMPRESSION: Port-A-Cath tip in superior vena cava near the cavoatrial junction.
No pneumothorax. Lungs clear. Cardiac silhouette within normal
limits.

## 2022-02-10 IMAGING — MG MM BREAST LOCALIZATION CLIP
4 series · 4 of 12 positions shown · non-contrast
Comparison: Previous exam(s).

CLINICAL DATA: Left breast mammogram status post radioactive seed
localization prior to lumpectomy.

EXAM:
DIAGNOSTIC LEFT MAMMOGRAM POST ULTRASOUND-GUIDED RADIOACTIVE SEED
PLACEMENT

[L ML synth-2D]
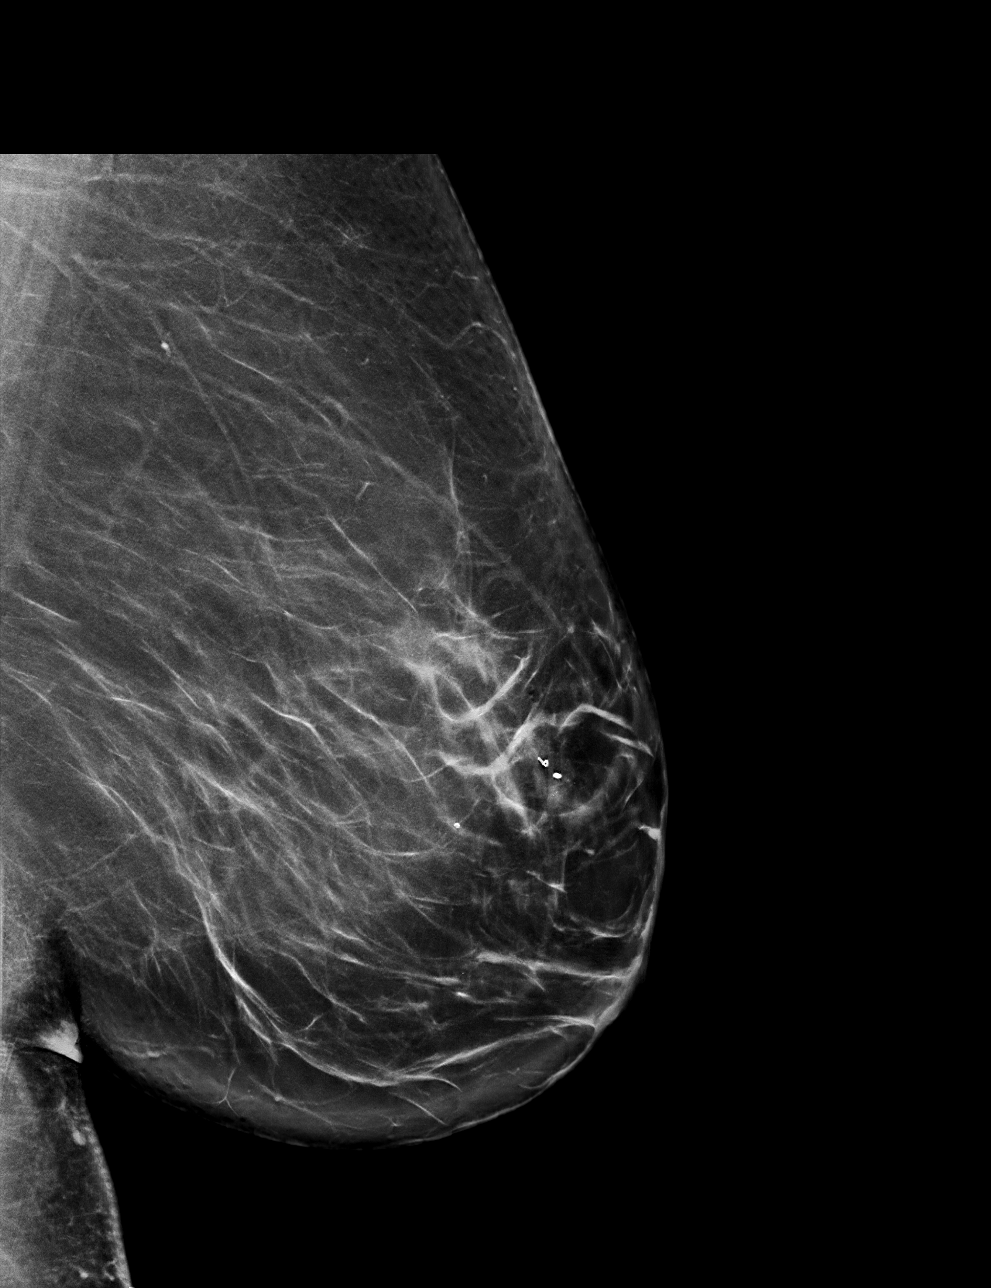

[L CC synth-2D]
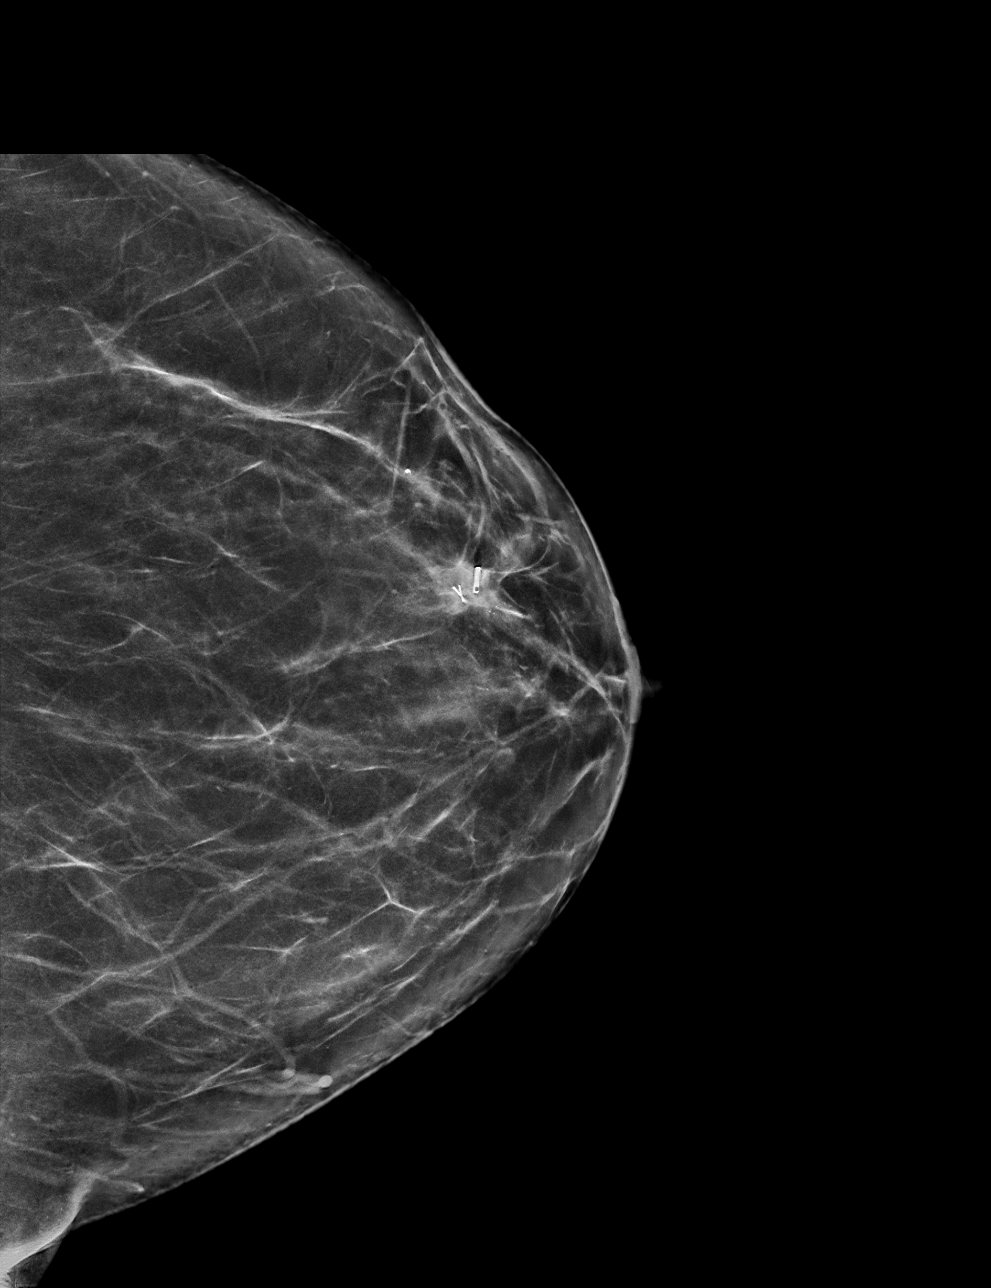

[L ML tomo · tomo slice 42/83.0]
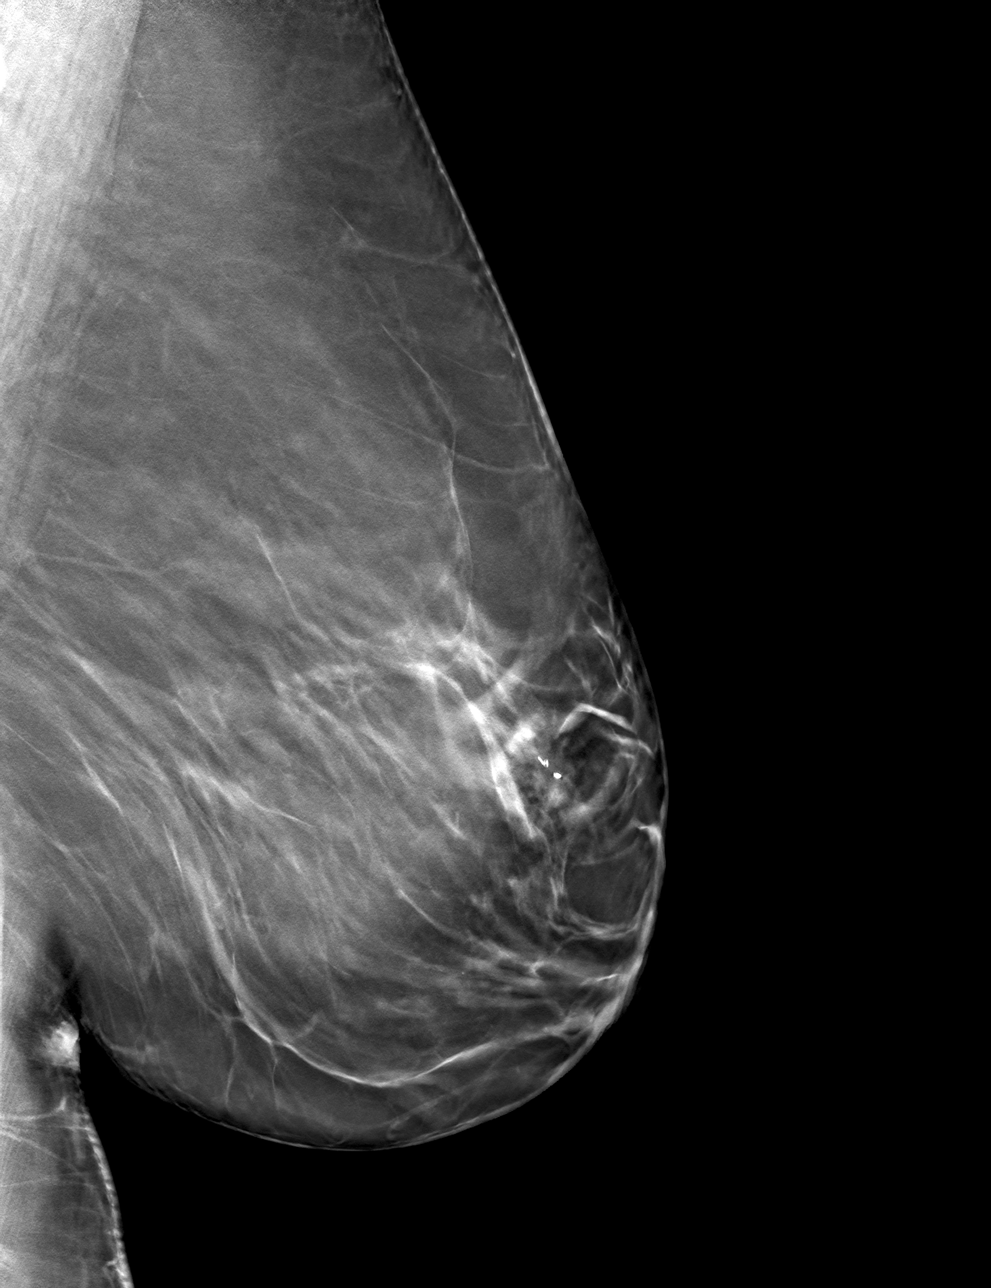

[L CC tomo · tomo slice 35/69.0]
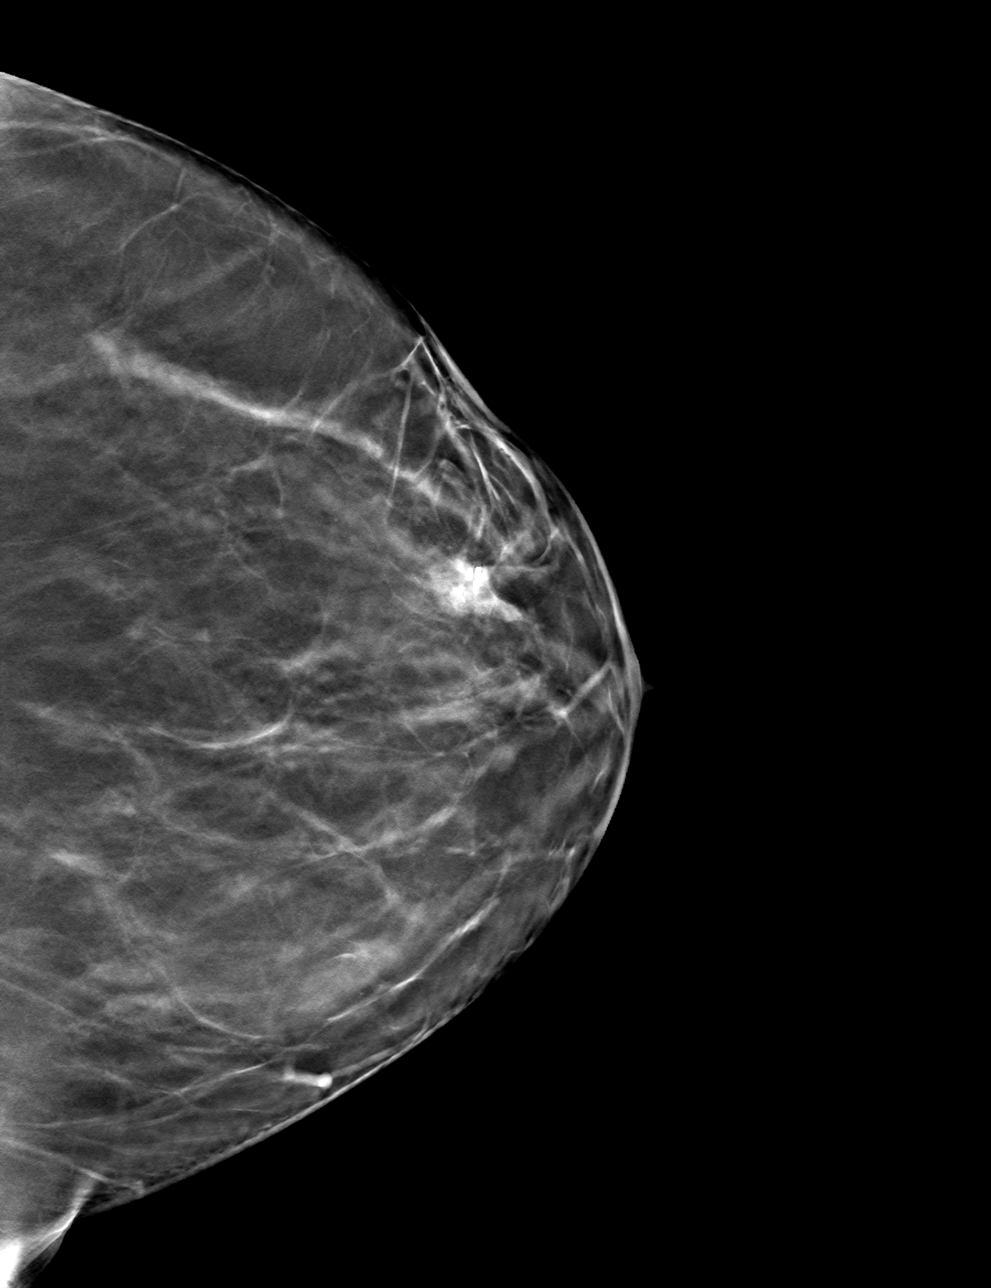

[4 of 12 positions shown; findings below may reference images not displayed]

FINDINGS: Mammographic images were obtained following ultrasound-guided
radioactive seed placement. These demonstrate the radioactive seed
placed immediately adjacent to the ribbon shaped biopsy marking clip
within the mass in the upper outer anterior left breast.
IMPRESSION: Appropriate location of the radioactive seed.

Final Assessment: Post Procedure Mammograms for Seed Placement

## 2022-03-04 ENCOUNTER — Other Ambulatory Visit: Payer: Self-pay | Admitting: Physician Assistant

## 2022-03-05 ENCOUNTER — Encounter: Payer: BC Managed Care – PPO | Admitting: Physician Assistant

## 2022-03-13 ENCOUNTER — Encounter: Payer: Self-pay | Admitting: Physician Assistant

## 2022-03-13 ENCOUNTER — Ambulatory Visit (INDEPENDENT_AMBULATORY_CARE_PROVIDER_SITE_OTHER): Payer: BC Managed Care – PPO | Admitting: Physician Assistant

## 2022-03-13 VITALS — BP 122/80 | HR 86 | Temp 97.7°F | Ht 60.0 in | Wt 162.2 lb

## 2022-03-13 DIAGNOSIS — Z1159 Encounter for screening for other viral diseases: Secondary | ICD-10-CM

## 2022-03-13 DIAGNOSIS — Z0000 Encounter for general adult medical examination without abnormal findings: Secondary | ICD-10-CM

## 2022-03-13 DIAGNOSIS — Z13220 Encounter for screening for lipoid disorders: Secondary | ICD-10-CM

## 2022-03-13 DIAGNOSIS — E785 Hyperlipidemia, unspecified: Secondary | ICD-10-CM | POA: Diagnosis not present

## 2022-03-13 DIAGNOSIS — N941 Unspecified dyspareunia: Secondary | ICD-10-CM

## 2022-03-13 DIAGNOSIS — E669 Obesity, unspecified: Secondary | ICD-10-CM | POA: Diagnosis not present

## 2022-03-13 DIAGNOSIS — Z23 Encounter for immunization: Secondary | ICD-10-CM

## 2022-03-13 DIAGNOSIS — Z136 Encounter for screening for cardiovascular disorders: Secondary | ICD-10-CM

## 2022-03-13 LAB — CBC WITH DIFFERENTIAL/PLATELET
Basophils Absolute: 0 10*3/uL (ref 0.0–0.1)
Basophils Relative: 0.4 % (ref 0.0–3.0)
Eosinophils Absolute: 0.1 10*3/uL (ref 0.0–0.7)
Eosinophils Relative: 1.7 % (ref 0.0–5.0)
HCT: 45.4 % (ref 36.0–46.0)
Hemoglobin: 15.6 g/dL — ABNORMAL HIGH (ref 12.0–15.0)
Lymphocytes Relative: 34.2 % (ref 12.0–46.0)
Lymphs Abs: 1.6 10*3/uL (ref 0.7–4.0)
MCHC: 34.3 g/dL (ref 30.0–36.0)
MCV: 96.8 fl (ref 78.0–100.0)
Monocytes Absolute: 0.4 10*3/uL (ref 0.1–1.0)
Monocytes Relative: 8.9 % (ref 3.0–12.0)
Neutro Abs: 2.6 10*3/uL (ref 1.4–7.7)
Neutrophils Relative %: 54.8 % (ref 43.0–77.0)
Platelets: 246 10*3/uL (ref 150.0–400.0)
RBC: 4.69 Mil/uL (ref 3.87–5.11)
RDW: 13.1 % (ref 11.5–15.5)
WBC: 4.7 10*3/uL (ref 4.0–10.5)

## 2022-03-13 LAB — COMPREHENSIVE METABOLIC PANEL
ALT: 18 U/L (ref 0–35)
AST: 18 U/L (ref 0–37)
Albumin: 4.7 g/dL (ref 3.5–5.2)
Alkaline Phosphatase: 91 U/L (ref 39–117)
BUN: 17 mg/dL (ref 6–23)
CO2: 28 mEq/L (ref 19–32)
Calcium: 9 mg/dL (ref 8.4–10.5)
Chloride: 106 mEq/L (ref 96–112)
Creatinine, Ser: 0.72 mg/dL (ref 0.40–1.20)
GFR: 92.46 mL/min (ref >60.00)
Glucose, Bld: 96 mg/dL (ref 70–99)
Potassium: 4.6 mEq/L (ref 3.5–5.1)
Sodium: 143 mEq/L (ref 135–145)
Total Bilirubin: 0.5 mg/dL (ref 0.2–1.2)
Total Protein: 7.3 g/dL (ref 6.0–8.3)

## 2022-03-13 LAB — LIPID PANEL
Cholesterol: 216 mg/dL — ABNORMAL HIGH (ref 0–200)
HDL: 85.4 mg/dL (ref >39.00)
LDL Cholesterol: 95 mg/dL (ref 0–99)
NonHDL: 131.04
Total CHOL/HDL Ratio: 3
Triglycerides: 181 mg/dL — ABNORMAL HIGH (ref 0.0–149.0)
VLDL: 36.2 mg/dL (ref 0.0–40.0)

## 2022-03-13 NOTE — Progress Notes (Signed)
Subjective:    [name] is a 56 y.o. female and is here for a comprehensive physical exam.  HPI  Health Maintenance Due  Topic Date Due   Hepatitis C Screening  Never done   Zoster Vaccines- Shingrix (1 of 2) Never done   COLONOSCOPY (Pts 45-37yr Insurance coverage will need to be confirmed)  Never done   COVID-19 Vaccine (3 - Pfizer risk series) 08/16/2019   INFLUENZA VACCINE  11/20/2021    Acute Concerns: Painful Intercourse  Patient is complaining of pain during intercourse occurring since chemo treatment for her left breast that started 08/12/2019. She manages symptom with lubrication with no relief. She reports that she does not see gynecology.   Chronic Issues: Hyperlipidemia  Patient is compliant with her 20 mg lipitor.  Health Maintenance: Cologuard last completed on 02/27/2021. Mammogram -- last completed on 08/31/2021. PAP -- last completed on 02/16/2021. Diet -- She maintains a fair diet. Exercise -- Patient does not participate in regular exercise.  Sleep habits -- She states that she doesn't sleep through the night. Mood -- Patient reports that she is stressed at work but she is able to manage it.   UTD with dentist? - She is UTD on dental care. UTD with eye doctor? - She is UTD on vision care.  Weight history: Wt Readings from Last 10 Encounters:  10/18/21 165 lb 4.8 oz (75 kg)  08/28/21 164 lb 0.4 oz (74.4 kg)  06/20/21 164 lb (74.4 kg)  05/16/21 164 lb (74.4 kg)  04/19/21 160 lb (72.6 kg)  02/16/21 162 lb (73.5 kg)  10/18/20 161 lb 9.6 oz (73.3 kg)  09/27/20 163 lb 4 oz (74 kg)  09/05/20 163 lb 8 oz (74.2 kg)  08/15/20 164 lb 4 oz (74.5 kg)   There is no height or weight on file to calculate BMI. Patient's last menstrual period was 05/01/2012 (lmp unknown).  Alcohol use:  reports current alcohol use of about 5.0 standard drinks of alcohol per week.  Tobacco use:  Tobacco Use: Low Risk  (04/30/2021)   Patient History    Smoking Tobacco Use:  Never    Smokeless Tobacco Use: Never    Passive Exposure: Not on file   Eligible for lung cancer screening? no     08/28/2021   11:41 AM  Depression screen PHQ 2/9  Decreased Interest 0  Down, Depressed, Hopeless 0  PHQ - 2 Score 0     Other providers/specialists: Patient Care Team: WInda Coke PUtahas PCP - General (Physician Assistant) SMauro Kaufmann RN as Oncology Nurse Navigator MRockwell Germany RN as Oncology Nurse Navigator WRolm Bookbinder MD as Consulting Physician (General Surgery) GNicholas Lose MD as Consulting Physician (Hematology and Oncology) MKyung Rudd MD as Consulting Physician (Radiation Oncology)    PMHx, SurgHx, SocialHx, Medications, and Allergies were reviewed in the Visit Navigator and updated as appropriate.   Past Medical History:  Diagnosis Date   Arthritis    Cancer ([place]    Left breast   Headache    migraine      Past Surgical History:  Procedure Laterality Date   ARTERY BIOPSY Right 11/11/2018   Procedure: BIOPSY TEMPORAL ARTERY;  Surgeon: ERosetta Posner MD;  [facility];  Service: Vascular;  Laterality: Right;   BREAST LUMPECTOMY WITH RADIOACTIVE SEED AND SENTINEL LYMPH NODE BIOPSY Left 08/05/2019   Procedure: LEFT BREAST LUMPECTOMY WITH RADIOACTIVE SEED AND LEFT AXILLARY SENTINEL LYMPH NODE BIOPSY;  Surgeon: WRolm Bookbinder MD;  Location: [facility];  Service: General;  Laterality: Left;  PEC BLOCK   CESAREAN SECTION     x2   PORTACATH PLACEMENT Right 08/05/2019   Procedure: INSERTION PORT-A-CATH WITH ULTRASOUND GUIDANCE;  Surgeon: Rolm Bookbinder, MD;  Location: [place];  Service: General;  Laterality: Right;     Family History  Problem Relation Age of Onset   COPD Mother    Emphysema Mother    Cancer Father    Heart disease Maternal Grandfather     Social History   Tobacco Use   Smoking status: Never   Smokeless tobacco: Never  Vaping Use   Vaping Use: Never used  Substance Use Topics   Alcohol use: Yes     Alcohol/week: 5.0 standard drinks of alcohol    Types: 5 Glasses of wine per week   Drug use: No    Review of Systems:   Review of Systems  Constitutional:  Negative for chills, fever, malaise/fatigue and weight loss.  HENT:  Negative for hearing loss, sinus pain and sore throat.   Respiratory:  Negative for cough and hemoptysis.   Cardiovascular:  Negative for chest pain, palpitations, leg swelling and PND.  Gastrointestinal:  Negative for abdominal pain, constipation, diarrhea, heartburn, nausea and vomiting.  Genitourinary:  Negative for dysuria, frequency and urgency.  Musculoskeletal:  Negative for back pain, myalgias and neck pain.  Skin:  Negative for itching and rash.  Endo/Heme/Allergies:  Negative for polydipsia.  Psychiatric/Behavioral:  Negative for depression. The patient is not nervous/anxious.     Objective:   LMP 05/01/2012 (LMP Unknown)  There is no height or weight on file to calculate BMI.   General Appearance:    Alert, cooperative, no distress, appears stated age  Head:    Normocephalic, without obvious abnormality, atraumatic  Eyes:    PERRL, conjunctiva/corneas clear, EOM's intact, fundi    benign, both eyes  Ears:    Normal TM's and external ear canals, both ears  Nose:   Nares normal, septum midline, mucosa normal, no drainage    or sinus tenderness  Throat:   Lips, mucosa, and tongue normal; teeth and gums normal  Neck:   Supple, symmetrical, trachea midline, no adenopathy;    thyroid:  no enlargement/tenderness/nodules; no carotid   bruit or JVD  Back:     Symmetric, no curvature, ROM normal, no CVA tenderness  Lungs:     Clear to auscultation bilaterally, respirations unlabored  Chest Wall:    No tenderness or deformity   Heart:    Regular rate and rhythm, S1 and S2 normal, no murmur, rub or gallop  Breast Exam:    Deferred  Abdomen:     Soft, non-tender, bowel sounds active all four quadrants,    no masses, no organomegaly  Genitalia:     Deferred  Extremities:   Extremities normal, atraumatic, no cyanosis or edema  Pulses:   2+ and symmetric all extremities  Skin:   Skin color, texture, turgor normal, no rashes or lesions  Lymph nodes:   Cervical, supraclavicular, and axillary nodes normal  Neurologic:   CNII-XII intact, normal strength, sensation and reflexes    throughout    Assessment/Plan:   Routine physical examination Today patient counseled on age appropriate routine health concerns for screening and prevention, each reviewed and up to date or declined. Immunizations reviewed and up to date or declined. Labs ordered and reviewed. Risk factors for depression reviewed and negative. Hearing function and visual acuity are intact. ADLs screened and  addressed as needed. Functional ability and level of safety reviewed and appropriate. Education, counseling and referrals performed based on assessed risks today. Patient provided with a copy of personalized plan for preventive services.  Encounter for screening for other viral diseases Update Hep C screening  Encounter for lipid screening for cardiovascular disease Update lipid screening  Obesity, unspecified classification, unspecified obesity type, unspecified whether serious comorbidity present Continue diet and exercise as able  Hyperlipidemia, unspecified hyperlipidemia type Update lipid panel and adjust lipitor 20 mg as indicated  Dyspareunia, female Consider trialing estrace cream -- will message her oncologist if in agreement  Need for immunization against influenza Completed today  I,Verona Buck,acting as a scribe for [employer], PA.,have documented all relevant documentation on the behalf of Inda Coke, PA,as directed by  Inda Coke, PA while in the presence of Inda Coke, [place].  I, Inda Coke, [place], have reviewed all documentation for this visit. The documentation on 03/13/22 for the exam, diagnosis, procedures, and orders are all  accurate and complete.  Inda Coke, PA-C [place]

## 2022-03-13 NOTE — Patient Instructions (Signed)
It was great to see you!  I will reach out to oncology about your topical estrogen. I'll be in touch.  Please go to the lab for blood work.   Our office will call you with your results unless you have chosen to receive results via MyChart.  If your blood work is normal we will follow-up each year for physicals and as scheduled for chronic medical problems.  If anything is abnormal we will treat accordingly and get you in for a follow-up.  Take care,  Aldona Bar

## 2022-03-14 LAB — HEPATITIS C ANTIBODY: Hepatitis C Ab: NONREACTIVE

## 2022-03-20 ENCOUNTER — Encounter: Payer: Self-pay | Admitting: Physician Assistant

## 2022-03-22 NOTE — Telephone Encounter (Signed)
Would you like me to place future CBC lab order for 4-6 wks and make lab only appt?

## 2022-03-29 ENCOUNTER — Other Ambulatory Visit: Payer: Self-pay

## 2022-03-29 DIAGNOSIS — D582 Other hemoglobinopathies: Secondary | ICD-10-CM

## 2022-03-29 NOTE — Telephone Encounter (Signed)
Future lab orders placed and called pt to schedule lab appt, lvm with cb number to call and schedule lab only appt

## 2022-05-01 ENCOUNTER — Other Ambulatory Visit (INDEPENDENT_AMBULATORY_CARE_PROVIDER_SITE_OTHER): Payer: BC Managed Care – PPO

## 2022-05-01 DIAGNOSIS — D582 Other hemoglobinopathies: Secondary | ICD-10-CM | POA: Diagnosis not present

## 2022-05-02 LAB — CBC WITH DIFFERENTIAL/PLATELET
Basophils Absolute: 0.1 10*3/uL (ref 0.0–0.1)
Basophils Relative: 1.1 % (ref 0.0–3.0)
Eosinophils Absolute: 0.1 10*3/uL (ref 0.0–0.7)
Eosinophils Relative: 1.8 % (ref 0.0–5.0)
HCT: 42.5 % (ref 36.0–46.0)
Hemoglobin: 14.4 g/dL (ref 12.0–15.0)
Lymphocytes Relative: 35.7 % (ref 12.0–46.0)
Lymphs Abs: 2.2 10*3/uL (ref 0.7–4.0)
MCHC: 33.9 g/dL (ref 30.0–36.0)
MCV: 96.2 fl (ref 78.0–100.0)
Monocytes Absolute: 0.5 10*3/uL (ref 0.1–1.0)
Monocytes Relative: 7.9 % (ref 3.0–12.0)
Neutro Abs: 3.3 10*3/uL (ref 1.4–7.7)
Neutrophils Relative %: 53.5 % (ref 43.0–77.0)
Platelets: 277 10*3/uL (ref 150.0–400.0)
RBC: 4.42 Mil/uL (ref 3.87–5.11)
RDW: 12.8 % (ref 11.5–15.5)
WBC: 6.1 10*3/uL (ref 4.0–10.5)

## 2022-06-05 ENCOUNTER — Other Ambulatory Visit: Payer: Self-pay | Admitting: Physician Assistant

## 2022-08-12 ENCOUNTER — Other Ambulatory Visit: Payer: Self-pay | Admitting: Hematology and Oncology

## 2022-08-12 DIAGNOSIS — Z853 Personal history of malignant neoplasm of breast: Secondary | ICD-10-CM

## 2022-09-04 ENCOUNTER — Ambulatory Visit
Admission: RE | Admit: 2022-09-04 | Discharge: 2022-09-04 | Disposition: A | Discharge status: Home / Self Care | Payer: BC Managed Care – PPO | Source: Ambulatory Visit | Attending: Hematology and Oncology | Admitting: Hematology and Oncology

## 2022-09-04 DIAGNOSIS — Z853 Personal history of malignant neoplasm of breast: Secondary | ICD-10-CM

## 2022-09-24 ENCOUNTER — Telehealth: Payer: Self-pay

## 2022-09-24 DIAGNOSIS — C50412 Malignant neoplasm of upper-outer quadrant of left female breast: Secondary | ICD-10-CM

## 2022-09-24 NOTE — Telephone Encounter (Signed)
Z6109 - A Prospective Observational Cohort Study to Develop a Predictive Model of Taxane-Induced Peripheral Neuropathy in Cancer Patients   Called to complete the week 156 assessments for this study.  Patient states she has not developed any neuropathy symptoms since her last visit.  She denies symptoms of numbness, tingling, or burning or shooting pain.  Completed the PROs: FACT/GOG-NTX-4, PRO-CTCAE, and EORTC QLQ-CIPN20 verbally over the phone.   The patient denies questions or concerns at this time.  She was informed this was her last assessment. She was thanked for her  participation.   Felecia Jan, [facility] 09/24/2022 2:49 PM

## 2022-09-30 ENCOUNTER — Telehealth: Payer: Self-pay | Admitting: Physician Assistant

## 2022-09-30 MED ORDER — ATORVASTATIN CALCIUM 20 MG PO TABS: 20.0000 mg | ORAL_TABLET | Freq: Every day | ORAL | 1 refills | Status: DC

## 2022-09-30 NOTE — Telephone Encounter (Signed)
Pt notified Rx for Atorvastatin sent to pharmacy.

## 2022-09-30 NOTE — Telephone Encounter (Signed)
Prescription Request  09/30/2022  LOV: 03/13/2022  What is the name of the medication or equipment?  atorvastatin (LIPITOR) 20 MG tablet  Have you contacted your pharmacy to request a refill? Yes   Which pharmacy would you like this sent to?  [id number] [place], [place] - [address] AT [facility] [address] [place] [address] Phone: [phone] Fax: [phone]    Patient notified that their request is being sent to the clinical staff for review and that they should receive a response within 2 business days.   Please advise at Mobile [phone] (mobile)

## 2022-10-19 NOTE — Progress Notes (Signed)
Patient Care Team: Jarold Motto, [place] as PCP - General (Physician Assistant) Pershing Proud, RN as Oncology Nurse Navigator Donnelly Angelica, RN as Oncology Nurse Navigator Emelia Loron, MD as Consulting Physician (General Surgery) Serena Croissant, MD as Consulting Physician (Hematology and Oncology) Dorothy Puffer, MD as Consulting Physician (Radiation Oncology)  DIAGNOSIS: No diagnosis found.  SUMMARY OF ONCOLOGIC HISTORY: Oncology History  Malignant neoplasm of upper-outer quadrant of left breast in female, estrogen receptor negative (HCC)  07/27/2019 Initial Diagnosis   Screening mammogram showed a left breast asymmetry. Diagnostic mammogram and US showed a 1.2cm left breast mass, 12:30 position, no left axillary adenopathy. 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   08/05/2019 Surgery   Left lumpectomy: Grade 3 IDC, 2 cm with high-grade DCIS, lymphovascular invasion was identified, 1 lymph node negative, margins negative, ER 0%, PR 0%, HER-2 positive, Ki-67 90%   08/05/2019 Cancer Staging   Staging form: Breast, AJCC 8th Edition - Pathologic stage from 08/05/2019: Stage IIA (pT2, pN0, cM0, G3, ER-, PR-, HER2+) - Signed by Loa Socks, NP on 08/18/2019   08/30/2019 - 10/18/2020 Chemotherapy   Patient is on Treatment Plan : BREAST weekly PACLitaxel / trastuzumab / Maintenance trastuzumab every 21 days       CHIEF COMPLIANT:  Follow-up on [facility] breast cancer   INTERVAL HISTORY: [name] is a 57 y.o. with above-mentioned history of left breast cancer who underwent a lumpectomy, adjuvant chemotherapy, and is currently on adjuvant Herceptin maintenance. She presents to the clinic today for a follow-up.    ALLERGIES:  is allergic to tape.  MEDICATIONS:  Current Outpatient Medications  Medication Sig Dispense Refill   atorvastatin (LIPITOR) 20 MG tablet Take 1 tablet (20 mg total) by mouth daily. 90 tablet 1   ibuprofen  (ADVIL) 200 MG tablet Take 400-600 mg by mouth every 8 (eight) hours as needed for moderate pain.     Multiple Vitamin (MULTIVITAMIN WITH MINERALS) TABS tablet Take 1 tablet by mouth daily. One-A-Day for Women 50+     No current facility-administered medications for this visit.    PHYSICAL EXAMINATION: ECOG PERFORMANCE STATUS: {CHL ONC ECOG PS:[phone]}  There were no vitals filed for this visit. There were no vitals filed for this visit.  BREAST:*** No palpable masses or nodules in either right or left breasts. No palpable axillary supraclavicular or infraclavicular adenopathy no breast tenderness or nipple discharge. (exam performed in the presence of a chaperone)  LABORATORY DATA:  I have reviewed the data as listed    Latest Ref Rng & Units 03/13/2022   10:56 AM 06/22/2020    8:39 AM 05/04/2020    1:58 PM  CMP  Glucose 70 - 99 mg/dL 96  92  98   BUN 6 - 23 mg/dL 17  21  19    Creatinine 0.40 - 1.20 mg/dL 2.95  6.21  3.08   Sodium 135 - 145 mEq/L 143  138  141   Potassium 3.5 - 5.1 mEq/L 4.6  4.0  4.1   Chloride 96 - 112 mEq/L 106  106  108   CO2 19 - 32 mEq/L 28  24  23    Calcium 8.4 - 10.5 mg/dL 9.0  8.7  9.2   Total Protein 6.0 - 8.3 g/dL 7.3  7.0  7.4   Total Bilirubin 0.2 - 1.2 mg/dL 0.5  0.4  0.4   Alkaline Phos 39 - 117 U/L 91  88  94   AST 0 - 37 U/L 18  18  20    ALT 0 - 35 U/L 18  20  22      Lab Results  Component Value Date   WBC 6.1 05/01/2022   HGB 14.4 05/01/2022   HCT 42.5 05/01/2022   MCV 96.2 05/01/2022   PLT 277.0 05/01/2022   NEUTROABS 3.3 05/01/2022    ASSESSMENT & PLAN:  No problem-specific Assessment & Plan notes found for this encounter.    No orders of the defined types were placed in this encounter.  The patient has a good understanding of the overall plan. she agrees with it. she will call with any problems that may develop before the next visit here. Total time spent: 30 mins including face to face time and time spent for planning,  charting and co-ordination of care   Sherlyn Lick, CMA 10/19/22    I Janan Ridge am acting as a Neurosurgeon for [employer]  ***

## 2022-10-21 ENCOUNTER — Inpatient Hospital Stay: Payer: BC Managed Care – PPO | Attending: Hematology and Oncology | Admitting: Hematology and Oncology

## 2022-10-21 ENCOUNTER — Other Ambulatory Visit: Payer: Self-pay

## 2022-10-21 VITALS — BP 150/75 | HR 94 | Temp 97.9°F | Resp 18 | Ht 60.0 in | Wt 160.2 lb

## 2022-10-21 DIAGNOSIS — Z9221 Personal history of antineoplastic chemotherapy: Secondary | ICD-10-CM | POA: Diagnosis not present

## 2022-10-21 DIAGNOSIS — C50412 Malignant neoplasm of upper-outer quadrant of left female breast: Secondary | ICD-10-CM | POA: Diagnosis present

## 2022-10-21 DIAGNOSIS — Z923 Personal history of irradiation: Secondary | ICD-10-CM | POA: Diagnosis not present

## 2022-10-21 DIAGNOSIS — Z171 Estrogen receptor negative status [ER-]: Secondary | ICD-10-CM | POA: Insufficient documentation

## 2022-10-21 NOTE — Assessment & Plan Note (Signed)
09/2019:Screening mammogram showed a left breast asymmetry. Diagnostic mammogram and US showed a 1.2cm left breast mass, 12:30 position, no left axillary adenopathy. 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 T1c N0 stage Ia clinical stage   Recommendation: 1.  08/05/2019:Left lumpectomy: Grade 3 IDC, 2 cm with high-grade DCIS, lymphovascular invasion was identified, 1 lymph node negative, margins negative, ER 0%, PR 0%, HER-2 positive, Ki-67 90% 2.  Adjuvant chemotherapy with Taxol Herceptin followed by Herceptin maintenance for 1 year started 08/30/2019 completed 11/18/2019 3.  Adjuvant radiation therapy 01/11/2020-02/04/2020 Patient is participating in [place] S 1714 neuropathy clinical trial  ------------------------------------------------------------------------------------------------------------------------------------------------------  Surveillance:  09/04/2022: Mammogram:  Benign breast density category B 10/21/2022: Breast exam: Return to clinic in 1 year for follow-up

## 2022-10-22 ENCOUNTER — Telehealth: Payer: Self-pay | Admitting: Hematology and Oncology

## 2022-10-22 NOTE — Telephone Encounter (Signed)
Scheduled appointment per 7/1 los. Patient is aware of the made appointment.

## 2022-12-27 ENCOUNTER — Telehealth: Payer: Self-pay

## 2022-12-27 DIAGNOSIS — Z171 Estrogen receptor negative status [ER-]: Secondary | ICD-10-CM

## 2022-12-27 NOTE — Telephone Encounter (Signed)
N8295 - A Prospective Observational Cohort Study to Develop a Predictive Model of Taxane-Induced Peripheral Neuropathy in Cancer Patients  Called the patient to complete the s1714 solicited neuropathy form as a part of the s1714 study. The patient answered all questions and was thanked for their time.  Felecia Jan, [facility] 12/27/2022 1:55 PM

## 2023-03-24 ENCOUNTER — Other Ambulatory Visit: Payer: Self-pay | Admitting: Physician Assistant

## 2023-03-24 ENCOUNTER — Encounter: Payer: Self-pay | Admitting: Physician Assistant

## 2023-03-24 ENCOUNTER — Ambulatory Visit (INDEPENDENT_AMBULATORY_CARE_PROVIDER_SITE_OTHER): Payer: BC Managed Care – PPO | Admitting: Physician Assistant

## 2023-03-24 VITALS — BP 130/86 | HR 84 | Temp 98.0°F | Ht 59.5 in | Wt 159.0 lb

## 2023-03-24 DIAGNOSIS — Z6831 Body mass index (BMI) 31.0-31.9, adult: Secondary | ICD-10-CM

## 2023-03-24 DIAGNOSIS — N941 Unspecified dyspareunia: Secondary | ICD-10-CM

## 2023-03-24 DIAGNOSIS — E785 Hyperlipidemia, unspecified: Secondary | ICD-10-CM

## 2023-03-24 DIAGNOSIS — Z23 Encounter for immunization: Secondary | ICD-10-CM | POA: Diagnosis not present

## 2023-03-24 DIAGNOSIS — Z0000 Encounter for general adult medical examination without abnormal findings: Secondary | ICD-10-CM

## 2023-03-24 DIAGNOSIS — E669 Obesity, unspecified: Secondary | ICD-10-CM | POA: Diagnosis not present

## 2023-03-24 DIAGNOSIS — M25551 Pain in right hip: Secondary | ICD-10-CM

## 2023-03-24 MED ORDER — ESTRADIOL 0.1 MG/GM VA CREA: 1.0000 | TOPICAL_CREAM | VAGINAL | 12 refills | Status: AC

## 2023-03-24 NOTE — Progress Notes (Signed)
[name] is a 57 y.o. female and is here for a comprehensive physical exam.  HPI  Acute Concerns: None.  Chronic Issues: Hip Pain (Right): Reports persistence of hip pain.  States she's had cortisone injections and PT without relief.  She is concerned that she may have gluteal tendinopathy.  Dyspareunia She is having pain with vaginal intercourse and would like to possibly try vaginal topical medication to help with this.  Health Maintenance: Immunizations -- utd Cologuard -- Last done 11/8/2. Results were normal. Repeat 2025. Mammogram -- Last done 09/04/22. Results were normal. Repeat 2025. PAP -- Last done 02/16/21. Results were normal. Repeat 2027. Bone Density -- N/A.  Diet -- Healthy overall: doesn't eat much throughout the day, has coffee in the morning, and tries to hydrate as much as possible.  Exercise -- Regular exercise: walks every weekend.  Sleep habits -- Recently waking up during the night, otherwise good sleep quality.  Mood -- Stable  UTD with dentist? - Yes UTD with eye doctor? - Yes Established/UTD with dermatology? - No, has not needed to  Weight history: Wt Readings from Last 10 Encounters:  03/24/23 159 lb (72.1 kg)  10/21/22 160 lb 3.2 oz (72.7 kg)  03/13/22 162 lb 4 oz (73.6 kg)  10/18/21 165 lb 4.8 oz (75 kg)  08/28/21 164 lb 0.4 oz (74.4 kg)  06/20/21 164 lb (74.4 kg)  05/16/21 164 lb (74.4 kg)  04/19/21 160 lb (72.6 kg)  02/16/21 162 lb (73.5 kg)  10/18/20 161 lb 9.6 oz (73.3 kg)   Body mass index is 31.58 kg/m. Patient's last menstrual period was 05/01/2012 (lmp unknown).  Alcohol use:  reports current alcohol use of about 4.0 standard drinks of alcohol per week.  Tobacco use:  Tobacco Use: Low Risk  (03/24/2023)   Patient History    Smoking Tobacco Use: Never    Smokeless Tobacco Use: Never    Passive Exposure: Not on file   Eligible for lung cancer screening? no     03/24/2023    2:07 PM  Depression screen PHQ 2/9   Decreased Interest 0  Down, Depressed, Hopeless 0  PHQ - 2 Score 0    Other providers/specialists: Patient Care Team: Jarold Motto, [place] as PCP - General (Physician Assistant) Pershing Proud, RN as Oncology Nurse Navigator Donnelly Angelica, RN as Oncology Nurse Navigator Emelia Loron, MD as Consulting Physician (General Surgery) Serena Croissant, MD as Consulting Physician (Hematology and Oncology) Dorothy Puffer, MD as Consulting Physician (Radiation Oncology)   PMHx, SurgHx, SocialHx, Medications, and Allergies were reviewed in the Visit Navigator and updated as appropriate.   Past Medical History:  Diagnosis Date   Arthritis    Cancer (HCC)    Left breast   Headache    migraine     Past Surgical History:  Procedure Laterality Date   ARTERY BIOPSY Right 11/11/2018   Procedure: BIOPSY TEMPORAL ARTERY;  Surgeon: Larina Earthly, MD;  Location: [facility] OR;  Service: Vascular;  Laterality: Right;   BREAST LUMPECTOMY WITH RADIOACTIVE SEED AND SENTINEL LYMPH NODE BIOPSY Left 08/05/2019   Procedure: LEFT BREAST LUMPECTOMY WITH RADIOACTIVE SEED AND LEFT AXILLARY SENTINEL LYMPH NODE BIOPSY;  Surgeon: Emelia Loron, MD;  Location: [facility] OR;  Service: General;  Laterality: Left;  PEC BLOCK   BREAST SURGERY  2021   CESAREAN SECTION     x2   PORTACATH PLACEMENT Right 08/05/2019   Procedure: INSERTION PORT-A-CATH WITH ULTRASOUND GUIDANCE;  Surgeon: Emelia Loron, MD;  Location:  [facility] OR;  Service: General;  Laterality: Right;   Family History  Problem Relation Age of Onset   COPD Mother    Emphysema Mother    Arthritis Mother    Asthma Mother    Cancer Father    Heart disease Maternal Grandfather    Social History   Tobacco Use   Smoking status: Never   Smokeless tobacco: Never  Vaping Use   Vaping status: Never Used  Substance Use Topics   Alcohol use: Yes    Alcohol/week: 4.0 standard drinks of alcohol    Types: 4 Glasses of wine per week   Drug use: Never   Review  of Systems:   Review of Systems  Constitutional:  Negative for chills, fever, malaise/fatigue and weight loss.  HENT:  Negative for hearing loss, sinus pain and sore throat.   Respiratory:  Negative for cough and hemoptysis.   Cardiovascular:  Negative for chest pain, palpitations, leg swelling and PND.  Gastrointestinal:  Negative for abdominal pain, constipation, diarrhea, heartburn, nausea and vomiting.  Genitourinary:  Negative for dysuria, frequency and urgency.  Musculoskeletal:  Negative for back pain, myalgias and neck pain.  Skin:  Negative for itching and rash.  Neurological:  Negative for dizziness, tingling, seizures and headaches.  Endo/Heme/Allergies:  Negative for polydipsia.  Psychiatric/Behavioral:  Negative for depression. The patient is not nervous/anxious.       Objective:   BP 130/86 (BP Location: Right Arm, Patient Position: Sitting, Cuff Size: Normal)   Pulse 84   Temp 98 F (36.7 C) (Temporal)   Ht 4' 11.5" (1.511 m)   Wt 159 lb (72.1 kg)   LMP 05/01/2012 (LMP Unknown)   SpO2 97%   BMI 31.58 kg/m  Body mass index is 31.58 kg/m.   General Appearance:    Alert, cooperative, no distress, appears stated age  Head:    Normocephalic, without obvious abnormality, atraumatic  Eyes:    PERRL, conjunctiva/corneas clear, EOM's intact, fundi    benign, both eyes  Ears:    Normal TM's and external ear canals, both ears  Nose:   Nares normal, septum midline, mucosa normal, no drainage    or sinus tenderness  Throat:   Lips, mucosa, and tongue normal; teeth and gums normal  Neck:   Supple, symmetrical, trachea midline, no adenopathy;    thyroid:  no enlargement/tenderness/nodules; no carotid   bruit or JVD  Back:     Symmetric, no curvature, ROM normal, no CVA tenderness  Lungs:     Clear to auscultation bilaterally, respirations unlabored  Chest Wall:    No tenderness or deformity   Heart:    Regular rate and rhythm, S1 and S2 normal, no murmur, rub or gallop   Breast Exam:    Deferred  Abdomen:     Soft, non-tender, bowel sounds active all four quadrants,    no masses, no organomegaly  Genitalia:    Deferred  Extremities:   Extremities normal, atraumatic, no cyanosis or edema  Pulses:   2+ and symmetric all extremities  Skin:   Skin color, texture, turgor normal, no rashes or lesions  Lymph nodes:   Cervical, supraclavicular, and axillary nodes normal  Neurologic:   CNII-XII intact, normal strength, sensation and reflexes    throughout    Assessment/Plan:   Routine physical examination Today patient counseled on age appropriate routine health concerns for screening and prevention, each reviewed and up to date or declined. Immunizations reviewed and up to date or declined. Labs  ordered and reviewed. Risk factors for depression reviewed and negative. Hearing function and visual acuity are intact. ADLs screened and addressed as needed. Functional ability and level of safety reviewed and appropriate. Education, counseling and referrals performed based on assessed risks today. Patient provided with a copy of personalized plan for preventive services.  Dyspareunia, female I have asked her oncologist if she would be a candidate for vaginal estrogen -- will send in if she is in agreement Otherwise, may consider hyaluronic vaginal suppositories  Hyperlipidemia, unspecified hyperlipidemia type Update lipid panel and provide recommendations  Obesity, unspecified class, unspecified obesity type, unspecified whether serious comorbidity present Reviewed healthier eating strategies -- hand out provided  Pain in right hip She is requesting a certain medication for this but is unable to tell me what this is She is going to send me a MyChart message regarding this I have sent a message to my physical therapists about this as well to see if there are any additional strategies/treatments  Need for immunization against influenza Done  I, Irving Burton Lagle,acting  as a scribe for [employer], PA.,have documented all relevant documentation on the behalf of Jarold Motto, PA,as directed by  Jarold Motto, PA while in the presence of Jarold Motto, [place].  I, Jarold Motto, [place], have reviewed all documentation for this visit. The documentation on 03/24/23 for the exam, diagnosis, procedures, and orders are all accurate and complete.  Jarold Motto, PA-C [facility]

## 2023-03-24 NOTE — Patient Instructions (Signed)
It was great to see you!  Message me with whatever treatment you would like to consider for your suspected gluteal tendinopathy  I will message Dr Pamelia Hoit on your behalf  Please go to the lab for blood work.   Our office will call you with your results unless you have chosen to receive results via MyChart.  If your blood work is normal we will follow-up each year for physicals and as scheduled for chronic medical problems.  If anything is abnormal we will treat accordingly and get you in for a follow-up.  Take care,  Lelon Mast

## 2023-03-25 LAB — CBC WITH DIFFERENTIAL/PLATELET
Basophils Absolute: 0.1 10*3/uL (ref 0.0–0.1)
Basophils Relative: 0.9 % (ref 0.0–3.0)
Eosinophils Absolute: 0.1 10*3/uL (ref 0.0–0.7)
Eosinophils Relative: 1.7 % (ref 0.0–5.0)
HCT: 43.9 % (ref 36.0–46.0)
Hemoglobin: 14.8 g/dL (ref 12.0–15.0)
Lymphocytes Relative: 29 % (ref 12.0–46.0)
Lymphs Abs: 1.7 10*3/uL (ref 0.7–4.0)
MCHC: 33.7 g/dL (ref 30.0–36.0)
MCV: 98.4 fL (ref 78.0–100.0)
Monocytes Absolute: 0.5 10*3/uL (ref 0.1–1.0)
Monocytes Relative: 8.1 % (ref 3.0–12.0)
Neutro Abs: 3.6 10*3/uL (ref 1.4–7.7)
Neutrophils Relative %: 60.3 % (ref 43.0–77.0)
Platelets: 246 10*3/uL (ref 150.0–400.0)
RBC: 4.46 Mil/uL (ref 3.87–5.11)
RDW: 12.7 % (ref 11.5–15.5)
WBC: 5.9 10*3/uL (ref 4.0–10.5)

## 2023-03-25 LAB — COMPREHENSIVE METABOLIC PANEL
ALT: 17 U/L (ref 0–35)
AST: 19 U/L (ref 0–37)
Albumin: 4.3 g/dL (ref 3.5–5.2)
Alkaline Phosphatase: 85 U/L (ref 39–117)
BUN: 23 mg/dL (ref 6–23)
CO2: 26 meq/L (ref 19–32)
Calcium: 9.1 mg/dL (ref 8.4–10.5)
Chloride: 106 meq/L (ref 96–112)
Creatinine, Ser: 0.72 mg/dL (ref 0.40–1.20)
GFR: 91.79 mL/min (ref >60.00)
Glucose, Bld: 91 mg/dL (ref 70–99)
Potassium: 4.1 meq/L (ref 3.5–5.1)
Sodium: 138 meq/L (ref 135–145)
Total Bilirubin: 0.3 mg/dL (ref 0.2–1.2)
Total Protein: 7.1 g/dL (ref 6.0–8.3)

## 2023-03-25 LAB — LIPID PANEL
Cholesterol: 223 mg/dL — ABNORMAL HIGH (ref 0–200)
HDL: 64.2 mg/dL (ref >39.00)
LDL Cholesterol: 91 mg/dL (ref 0–99)
NonHDL: 158.72
Total CHOL/HDL Ratio: 3
Triglycerides: 339 mg/dL — ABNORMAL HIGH (ref 0.0–149.0)
VLDL: 67.8 mg/dL — ABNORMAL HIGH (ref 0.0–40.0)

## 2023-04-01 ENCOUNTER — Other Ambulatory Visit: Payer: Self-pay | Admitting: Physician Assistant

## 2023-06-28 ENCOUNTER — Other Ambulatory Visit: Payer: Self-pay | Admitting: Physician Assistant

## 2023-07-29 ENCOUNTER — Other Ambulatory Visit: Payer: Self-pay | Admitting: Hematology and Oncology

## 2023-07-29 DIAGNOSIS — Z9889 Other specified postprocedural states: Secondary | ICD-10-CM

## 2023-09-01 ENCOUNTER — Encounter (HOSPITAL_COMMUNITY): Payer: Self-pay

## 2023-09-08 ENCOUNTER — Ambulatory Visit
Admission: RE | Admit: 2023-09-08 | Discharge: 2023-09-08 | Disposition: A | Discharge status: Home / Self Care | Payer: Self-pay | Source: Ambulatory Visit | Attending: Hematology and Oncology | Admitting: Hematology and Oncology

## 2023-09-08 DIAGNOSIS — Z9889 Other specified postprocedural states: Secondary | ICD-10-CM

## 2023-10-20 NOTE — Assessment & Plan Note (Signed)
 09/2019:Screening mammogram showed a left breast asymmetry. Diagnostic mammogram and US  showed a 1.2cm left breast mass, 12:30 position, no left axillary adenopathy. 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 T1c N0 stage Ia clinical stage   Recommendation: 1.  08/05/2019:Left lumpectomy: Grade 3 IDC, 2 cm with high-grade DCIS, lymphovascular invasion was identified, 1 lymph node negative, margins negative, ER 0%, PR 0%, HER-2 positive, Ki-67 90% 2.  Adjuvant chemotherapy with Taxol  Herceptin  followed by Herceptin  maintenance for 1 year started 08/30/2019 completed 11/18/2019 3.  Adjuvant radiation therapy 01/11/2020-02/04/2020 Patient is participating in [place] S 1714 neuropathy clinical trial  ------------------------------------------------------------------------------------------------------------------------------------------------------  Surveillance:  09/08/2023: Mammogram:  Benign breast density category B 10/21/2022: Breast exam: Benign Return to clinic in 1 year for follow-up

## 2023-10-21 ENCOUNTER — Inpatient Hospital Stay: Payer: BC Managed Care – PPO | Attending: Hematology and Oncology | Admitting: Hematology and Oncology

## 2023-10-21 VITALS — BP 141/83 | HR 88 | Temp 98.4°F | Resp 16 | Wt 155.0 lb

## 2023-10-21 DIAGNOSIS — Z853 Personal history of malignant neoplasm of breast: Secondary | ICD-10-CM | POA: Diagnosis present

## 2023-10-21 DIAGNOSIS — Z171 Estrogen receptor negative status [ER-]: Secondary | ICD-10-CM | POA: Diagnosis not present

## 2023-10-21 DIAGNOSIS — C50412 Malignant neoplasm of upper-outer quadrant of left female breast: Secondary | ICD-10-CM | POA: Insufficient documentation

## 2023-10-21 NOTE — Progress Notes (Signed)
 Patient Care Team: Job Lukes, [place] as PCP - General (Physician Assistant) Glean Stephane BROCKS, RN (Inactive) as Oncology Nurse Navigator Tyree Nanetta SAILOR, RN as Oncology Nurse Navigator Ebbie Cough, MD as Consulting Physician (General Surgery) Odean Potts, MD as Consulting Physician (Hematology and Oncology) Dewey Rush, MD as Consulting Physician (Radiation Oncology)  DIAGNOSIS:  Encounter Diagnosis  Name Primary?   Malignant neoplasm of upper-outer quadrant of left breast in female, estrogen receptor negative (HCC) Yes    SUMMARY OF ONCOLOGIC HISTORY: Oncology History  Malignant neoplasm of upper-outer quadrant of left breast in female, estrogen receptor negative (HCC)  07/27/2019 Initial Diagnosis   Screening mammogram showed a left breast asymmetry. Diagnostic mammogram and US  showed a 1.2cm left breast mass, 12:30 position, no left axillary adenopathy. 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   08/05/2019 Surgery   Left lumpectomy: Grade 3 IDC, 2 cm with high-grade DCIS, lymphovascular invasion was identified, 1 lymph node negative, margins negative, ER 0%, PR 0%, HER-2 positive, Ki-67 90%   08/05/2019 Cancer Staging   Staging form: Breast, AJCC 8th Edition - Pathologic stage from 08/05/2019: Stage IIA (pT2, pN0, cM0, G3, ER-, PR-, HER2+) - Signed by Crawford Morna Pickle, NP on 08/18/2019   08/30/2019 - 10/18/2020 Chemotherapy   Patient is on Treatment Plan : BREAST weekly PACLitaxel  / trastuzumab  / Maintenance trastuzumab  every 21 days       CHIEF COMPLIANT: Surveillance of breast cancer  HISTORY OF PRESENT ILLNESS:   History of Present Illness [name] is a 58 year old female who presents for a routine follow-up visit.  She is asymptomatic and maintains an active lifestyle. A recent mammogram on May 19th showed low breast density, categorized as B, with some surgical changes but otherwise benign findings. She experiences no  pain or discomfort.     ALLERGIES:  is allergic to tape.  MEDICATIONS:  Current Outpatient Medications  Medication Sig Dispense Refill   atorvastatin  (LIPITOR) 20 MG tablet TAKE 1 TABLET(20 MG) BY MOUTH DAILY 90 tablet 0   estradiol  (ESTRACE ) 0.1 MG/GM vaginal cream Place 1 Applicatorful vaginally 2 (two) times a week. 42.5 g 12   ibuprofen  (ADVIL ) 200 MG tablet Take 400-600 mg by mouth every 8 (eight) hours as needed for moderate pain.     Multiple Vitamin (MULTIVITAMIN WITH MINERALS) TABS tablet Take 1 tablet by mouth daily. One-A-Day for Women 50+     No current facility-administered medications for this visit.    PHYSICAL EXAMINATION: ECOG PERFORMANCE STATUS: 1 - Symptomatic but completely ambulatory  Vitals:   10/21/23 1137  BP: (!) 141/83  Pulse: 88  Resp: 16  Temp: 98.4 F (36.9 C)  SpO2: 98%   Filed Weights   10/21/23 1137  Weight: 155 lb (70.3 kg)      LABORATORY DATA:  I have reviewed the data as listed    Latest Ref Rng & Units 03/24/2023    2:52 PM 03/13/2022   10:56 AM 06/22/2020    8:39 AM  CMP  Glucose 70 - 99 mg/dL 91  96  92   BUN 6 - 23 mg/dL 23  17  21    Creatinine 0.40 - 1.20 mg/dL 9.27  9.27  9.16   Sodium 135 - 145 mEq/L 138  143  138   Potassium 3.5 - 5.1 mEq/L 4.1  4.6  4.0   Chloride 96 - 112 mEq/L 106  106  106   CO2 19 -  32 mEq/L 26  28  24    Calcium  8.4 - 10.5 mg/dL 9.1  9.0  8.7   Total Protein 6.0 - 8.3 g/dL 7.1  7.3  7.0   Total Bilirubin 0.2 - 1.2 mg/dL 0.3  0.5  0.4   Alkaline Phos 39 - 117 U/L 85  91  88   AST 0 - 37 U/L 19  18  18    ALT 0 - 35 U/L 17  18  20      Lab Results  Component Value Date   WBC 5.9 03/24/2023   HGB 14.8 03/24/2023   HCT 43.9 03/24/2023   MCV 98.4 03/24/2023   PLT 246.0 03/24/2023   NEUTROABS 3.6 03/24/2023    ASSESSMENT & PLAN:  Malignant neoplasm of upper-outer quadrant of left breast in female, estrogen receptor negative (HCC) 09/2019:Screening mammogram showed a left breast asymmetry.  Diagnostic mammogram and US  showed a 1.2cm left breast mass, 12:30 position, no left axillary adenopathy. 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 T1c N0 stage Ia clinical stage   Recommendation: 1.  08/05/2019:Left lumpectomy: Grade 3 IDC, 2 cm with high-grade DCIS, lymphovascular invasion was identified, 1 lymph node negative, margins negative, ER 0%, PR 0%, HER-2 positive, Ki-67 90% 2.  Adjuvant chemotherapy with Taxol  Herceptin  followed by Herceptin  maintenance for 1 year started 08/30/2019 completed 11/18/2019 3.  Adjuvant radiation therapy 01/11/2020-02/04/2020 Patient is participating in [place] S 1714 neuropathy clinical trial  ------------------------------------------------------------------------------------------------------------------------------------------------------  Surveillance:  09/08/2023: Mammogram:  Benign breast density category B 10/21/2022: Breast exam: Benign Recommended guardant reveal for MRD testing Return to clinic in 1 year for follow-up -    No orders of the defined types were placed in this encounter.  The patient has a good understanding of the overall plan. she agrees with it. she will call with any problems that may develop before the next visit here. Total time spent: 30 mins including face to face time and time spent for planning, charting and co-ordination of care   Viinay K Damarco Keysor, MD 10/21/23

## 2023-12-29 ENCOUNTER — Other Ambulatory Visit: Payer: Self-pay | Admitting: Physician Assistant

## 2024-04-03 ENCOUNTER — Other Ambulatory Visit: Payer: Self-pay | Admitting: Physician Assistant

## 2024-05-18 ENCOUNTER — Telehealth: Payer: Self-pay

## 2024-05-18 ENCOUNTER — Other Ambulatory Visit: Payer: Self-pay | Admitting: Physician Assistant

## 2024-05-18 MED ORDER — ATORVASTATIN CALCIUM 20 MG PO TABS: 20.0000 mg | ORAL_TABLET | Freq: Every day | ORAL | 0 refills | Status: AC

## 2024-05-18 NOTE — Telephone Encounter (Signed)
 Please advise on giving a courtesy/two week refill on medications as patient is scheduled for mid February. Please and thank you.    Copied from CRM #[id number]. Topic: Clinical - Medication Refill >> May 18, 2024  1:19 PM Zebedee SAUNDERS wrote: Medication:  atorvastatin  (LIPITOR) 20 MG tablet   Has the patient contacted their pharmacy? Yes (Agent: If no, request that the patient contact the pharmacy for the refill. If patient does not wish to contact the pharmacy document the reason why and proceed with request.) (Agent: If yes, when and what did the pharmacy advise?)Pharmacy need script  This is the patient's preferred pharmacy:  [id number] [place], [place] - [address] AT [facility] [address] [place] [address] Phone: [phone] Fax: [phone]  Is this the correct pharmacy for this prescription? Yes If no, delete pharmacy and type the correct one.   Has the prescription been filled recently? Yes  Is the patient out of the medication? Yes  Has the patient been seen for an appointment in the last year OR does the patient have an upcoming appointment? Yes  Can we respond through MyChart? Yes  Agent: Please be advised that Rx refills may take up to 3 business days. We ask that you follow-up with your pharmacy.

## 2024-06-04 ENCOUNTER — Encounter: Admitting: Physician Assistant

## 2024-10-21 ENCOUNTER — Ambulatory Visit: Admitting: Hematology and Oncology
